# Patient Record
Sex: Female | Born: 1970 | Race: White | Hispanic: No | Marital: Married | Smoking: Current every day smoker
Health system: Southern US, Community
[De-identification: ages and names within clinical notes are randomized; demographics above are authoritative.]

## PROBLEM LIST (undated history)

## (undated) DIAGNOSIS — Z8669 Personal history of other diseases of the nervous system and sense organs: Secondary | ICD-10-CM

## (undated) DIAGNOSIS — F32A Depression, unspecified: Secondary | ICD-10-CM

## (undated) DIAGNOSIS — E785 Hyperlipidemia, unspecified: Secondary | ICD-10-CM

## (undated) DIAGNOSIS — T7840XA Allergy, unspecified, initial encounter: Secondary | ICD-10-CM

## (undated) DIAGNOSIS — D649 Anemia, unspecified: Secondary | ICD-10-CM

## (undated) DIAGNOSIS — Z9889 Other specified postprocedural states: Secondary | ICD-10-CM

## (undated) DIAGNOSIS — M1990 Unspecified osteoarthritis, unspecified site: Secondary | ICD-10-CM

## (undated) DIAGNOSIS — R112 Nausea with vomiting, unspecified: Secondary | ICD-10-CM

## (undated) DIAGNOSIS — H269 Unspecified cataract: Secondary | ICD-10-CM

## (undated) DIAGNOSIS — K219 Gastro-esophageal reflux disease without esophagitis: Secondary | ICD-10-CM

## (undated) DIAGNOSIS — F329 Major depressive disorder, single episode, unspecified: Secondary | ICD-10-CM

## (undated) DIAGNOSIS — F419 Anxiety disorder, unspecified: Secondary | ICD-10-CM

## (undated) DIAGNOSIS — R51 Headache: Secondary | ICD-10-CM

## (undated) HISTORY — DX: Unspecified osteoarthritis, unspecified site: M19.90

## (undated) HISTORY — DX: Personal history of other diseases of the nervous system and sense organs: Z86.69

## (undated) HISTORY — PX: FRACTURE SURGERY: SHX138

## (undated) HISTORY — DX: Allergy, unspecified, initial encounter: T78.40XA

## (undated) HISTORY — PX: MANDIBLE FRACTURE SURGERY: SHX706

## (undated) HISTORY — DX: Gastro-esophageal reflux disease without esophagitis: K21.9

## (undated) HISTORY — DX: Unspecified cataract: H26.9

## (undated) HISTORY — DX: Hyperlipidemia, unspecified: E78.5

---

## 1998-08-27 ENCOUNTER — Encounter: Payer: Self-pay | Admitting: Emergency Medicine

## 1998-08-27 ENCOUNTER — Emergency Department (HOSPITAL_COMMUNITY): Admission: EM | Admit: 1998-08-27 | Discharge: 1998-08-28 | Payer: Self-pay | Admitting: Emergency Medicine

## 1998-10-09 ENCOUNTER — Inpatient Hospital Stay (HOSPITAL_COMMUNITY): Admission: AD | Admit: 1998-10-09 | Discharge: 1998-10-09 | Payer: Self-pay | Admitting: *Deleted

## 1998-10-23 ENCOUNTER — Inpatient Hospital Stay (HOSPITAL_COMMUNITY): Admission: AD | Admit: 1998-10-23 | Discharge: 1998-10-23 | Payer: Self-pay | Admitting: *Deleted

## 1998-12-02 ENCOUNTER — Emergency Department (HOSPITAL_COMMUNITY): Admission: EM | Admit: 1998-12-02 | Discharge: 1998-12-03 | Payer: Self-pay | Admitting: Emergency Medicine

## 1998-12-05 ENCOUNTER — Encounter: Payer: Self-pay | Admitting: Emergency Medicine

## 1998-12-05 ENCOUNTER — Ambulatory Visit (HOSPITAL_COMMUNITY): Admission: RE | Admit: 1998-12-05 | Discharge: 1998-12-05 | Payer: Self-pay | Admitting: Emergency Medicine

## 2003-02-18 ENCOUNTER — Emergency Department (HOSPITAL_COMMUNITY): Admission: EM | Admit: 2003-02-18 | Discharge: 2003-02-18 | Payer: Self-pay | Admitting: Emergency Medicine

## 2004-03-27 ENCOUNTER — Inpatient Hospital Stay (HOSPITAL_COMMUNITY): Admission: AD | Admit: 2004-03-27 | Discharge: 2004-03-27 | Payer: Self-pay | Admitting: *Deleted

## 2005-05-28 ENCOUNTER — Observation Stay: Payer: Self-pay | Admitting: Obstetrics & Gynecology

## 2005-06-25 ENCOUNTER — Inpatient Hospital Stay: Payer: Self-pay | Admitting: Obstetrics & Gynecology

## 2005-12-06 ENCOUNTER — Emergency Department (HOSPITAL_COMMUNITY): Admission: EM | Admit: 2005-12-06 | Discharge: 2005-12-06 | Payer: Self-pay | Admitting: Emergency Medicine

## 2009-07-11 ENCOUNTER — Ambulatory Visit (HOSPITAL_COMMUNITY): Admission: RE | Admit: 2009-07-11 | Discharge: 2009-07-11 | Payer: Self-pay | Admitting: Obstetrics and Gynecology

## 2009-07-11 ENCOUNTER — Inpatient Hospital Stay (HOSPITAL_COMMUNITY): Admission: AD | Admit: 2009-07-11 | Discharge: 2009-07-12 | Payer: Self-pay | Admitting: Obstetrics & Gynecology

## 2009-08-19 ENCOUNTER — Inpatient Hospital Stay (HOSPITAL_COMMUNITY): Admission: AD | Admit: 2009-08-19 | Discharge: 2009-08-19 | Payer: Self-pay | Admitting: Obstetrics and Gynecology

## 2009-08-21 ENCOUNTER — Inpatient Hospital Stay (HOSPITAL_COMMUNITY): Admission: AD | Admit: 2009-08-21 | Discharge: 2009-08-22 | Payer: Self-pay | Admitting: Specialist

## 2010-09-08 LAB — FETAL FIBRONECTIN: Fetal Fibronectin: NEGATIVE

## 2010-09-11 LAB — GLUCOSE, CAPILLARY: Glucose-Capillary: 68 mg/dL — ABNORMAL LOW (ref 70–99)

## 2010-09-15 LAB — CBC
HCT: 29.6 % — ABNORMAL LOW (ref 36.0–46.0)
Hemoglobin: 10.1 g/dL — ABNORMAL LOW (ref 12.0–15.0)
MCHC: 34.2 g/dL (ref 30.0–36.0)
MCHC: 34.5 g/dL (ref 30.0–36.0)
MCV: 95.4 fL (ref 78.0–100.0)
RBC: 3.56 MIL/uL — ABNORMAL LOW (ref 3.87–5.11)
RDW: 15 % (ref 11.5–15.5)
WBC: 7.9 10*3/uL (ref 4.0–10.5)

## 2010-09-15 LAB — RPR: RPR Ser Ql: NONREACTIVE

## 2012-04-23 ENCOUNTER — Encounter (HOSPITAL_COMMUNITY): Payer: Self-pay | Admitting: *Deleted

## 2012-04-23 ENCOUNTER — Emergency Department (HOSPITAL_COMMUNITY)
Admission: EM | Admit: 2012-04-23 | Discharge: 2012-04-23 | Disposition: A | Discharge status: Home / Self Care | Payer: PRIVATE HEALTH INSURANCE | Attending: Emergency Medicine | Admitting: Emergency Medicine

## 2012-04-23 DIAGNOSIS — F17200 Nicotine dependence, unspecified, uncomplicated: Secondary | ICD-10-CM | POA: Insufficient documentation

## 2012-04-23 DIAGNOSIS — Y929 Unspecified place or not applicable: Secondary | ICD-10-CM | POA: Insufficient documentation

## 2012-04-23 DIAGNOSIS — R296 Repeated falls: Secondary | ICD-10-CM | POA: Insufficient documentation

## 2012-04-23 DIAGNOSIS — Z8781 Personal history of (healed) traumatic fracture: Secondary | ICD-10-CM | POA: Insufficient documentation

## 2012-04-23 DIAGNOSIS — M25579 Pain in unspecified ankle and joints of unspecified foot: Secondary | ICD-10-CM | POA: Insufficient documentation

## 2012-04-23 DIAGNOSIS — M25572 Pain in left ankle and joints of left foot: Secondary | ICD-10-CM

## 2012-04-23 DIAGNOSIS — Z8659 Personal history of other mental and behavioral disorders: Secondary | ICD-10-CM | POA: Insufficient documentation

## 2012-04-23 DIAGNOSIS — Y939 Activity, unspecified: Secondary | ICD-10-CM | POA: Insufficient documentation

## 2012-04-23 HISTORY — DX: Depression, unspecified: F32.A

## 2012-04-23 HISTORY — DX: Major depressive disorder, single episode, unspecified: F32.9

## 2012-04-23 MED ORDER — IBUPROFEN 800 MG PO TABS: 800.0000 mg | ORAL_TABLET | Freq: Three times a day (TID) | ORAL | Status: DC

## 2012-04-23 MED ORDER — HYDROMORPHONE HCL PF 2 MG/ML IJ SOLN
2.0000 mg | Freq: Once | INTRAMUSCULAR | Status: AC
  Administered 2012-04-23: 2 mg via INTRAMUSCULAR
  Filled 2012-04-23: qty 1

## 2012-04-23 MED ORDER — OXYCODONE-ACETAMINOPHEN 5-325 MG PO TABS: 1.0000 | ORAL_TABLET | ORAL | Status: DC | PRN

## 2012-04-23 NOTE — ED Provider Notes (Signed)
History     CSN: [id number]  Arrival date & time 04/23/12  1033   First MD Initiated Contact with Patient 04/23/12 1125      Chief Complaint  Patient presents with  . Pain    (Consider location/radiation/quality/duration/timing/severity/associated sxs/prior treatment) Patient is a 41 y.o. female presenting with ankle pain. The history is provided by the patient.  Ankle Pain  The incident occurred yesterday. Associated symptoms comments: She was seen after a fall yesterday at [facility] and diagnosed with an ankle fracture. She is here for evaluation of continued ankle pain without further injury that is unrelieved with Percocet. She has not been weight bearing. .    Past Medical History  Diagnosis Date  . Depression     History reviewed. No pertinent past surgical history.  No family history on file.  History  Substance Use Topics  . Smoking status: Current Every Day Smoker -- 0.5 packs/day  . Smokeless tobacco: Not on file  . Alcohol Use: Yes     rarely    OB History    Grav Para Term Preterm Abortions TAB SAB Ect Mult Living                  Review of Systems  Constitutional: Negative for fever and chills.  HENT: Negative.   Respiratory: Negative.   Cardiovascular: Negative.   Gastrointestinal: Negative.   Musculoskeletal:       See HPI.  Skin: Negative.   Neurological: Negative.     Allergies  Amoxicillin; Adhesive; and Aloe  Home Medications   Current Outpatient Rx  Name Route Sig Dispense Refill  . BC HEADACHE POWDER PO Oral Take by mouth.    . ADULT MULTIVITAMIN W/MINERALS CH Oral Take 1 tablet by mouth daily.    . OXYCODONE HCL 5 MG PO TABS Oral Take 5 mg by mouth every 6 (six) hours as needed.      BP 100/72  Pulse 75  Temp 98.1 F (36.7 C) (Oral)  Resp 20  Wt 115 lb (52.164 kg)  SpO2 100%  LMP 04/19/2012  Physical Exam  Constitutional: She is oriented to person, place, and time. She appears well-developed and well-nourished.   Neck: Normal range of motion.  Cardiovascular:       Pulses distally intact left LE.  Pulmonary/Chest: Effort normal.  Musculoskeletal:       Cast removed from left ankle. Moderate swelling with ecchymosis of ankle, predominantly lateral aspect. No bony deformities.   Neurological: She is alert and oriented to person, place, and time.  Skin: Skin is warm and dry.    ED Course  Procedures (including critical care time)  Labs Reviewed - No data to display No results found.   No diagnosis found.  1. Left ankle pain 2. History of fracture.  MDM  Pain is significantly relieved with change of splint. Original splint appeared to have been placed too tightly.  Records obtained from [facility] which gives report of minimally displaced fractures of lateral and medial malleoli, which is consistent with exam. No further x-rays warranted. Will refer to ortho.      Rodena Medin, PA-C 04/23/12 1241

## 2012-04-23 NOTE — ED Notes (Signed)
Pt states "broke my ankle yesterday, wasn't referred to an orthopedist, has roxicodone but it's not touching the pain"; pt presents with soft cast to LLE

## 2012-04-23 NOTE — ED Notes (Signed)
Ortho tech at bedside 

## 2012-04-23 NOTE — ED Notes (Signed)
Family at bedside. 

## 2012-04-23 NOTE — ED Notes (Signed)
pa at bedside. 

## 2012-04-24 NOTE — ED Provider Notes (Signed)
Medical screening examination/treatment/procedure(s) were performed by non-physician practitioner and as supervising physician I was immediately available for consultation/collaboration.   Antwan Bribiesca, MD 04/24/12 0704 

## 2012-04-27 ENCOUNTER — Other Ambulatory Visit: Payer: Self-pay | Admitting: Orthopaedic Surgery

## 2012-04-27 ENCOUNTER — Encounter (HOSPITAL_COMMUNITY): Payer: Self-pay | Admitting: *Deleted

## 2012-04-27 ENCOUNTER — Other Ambulatory Visit (HOSPITAL_COMMUNITY): Payer: Self-pay | Admitting: Orthopaedic Surgery

## 2012-04-27 NOTE — H&P (Signed)
Brianna Bowman is an 41 y.o. female.   Chief Complaint: left bimalleolar ankle fracture closed, displaced HPI: jumping a creek and had above injury. spint too tight, went to [facility] and new splint applied.   Past Medical History  Diagnosis Date  . Depression     No past surgical history on file.  No family history on file. Social History:  reports that she has been smoking.  She does not have any smokeless tobacco history on file. She reports that she drinks alcohol. She reports that she does not use illicit drugs.  Allergies:  Allergies  Allergen Reactions  . Amoxicillin Anaphylaxis  . Adhesive (Tape) Hives  . Aloe Hives    No prescriptions prior to admission    No results found for this or any previous visit (from the past 48 hour(s)). No results found.  Review of Systems  Constitutional: Negative.   HENT: Negative.   Eyes: Negative.   Respiratory:       Smokes  Gastrointestinal: Negative.   Genitourinary: Negative.   Musculoskeletal:       Left SLS  Neurological: Negative.   Psychiatric/Behavioral: Positive for depression.    Last menstrual period 04/19/2012. Physical Exam  Constitutional: She is oriented to person, place, and time. She appears well-developed and well-nourished.  HENT:  Head: Normocephalic.  Eyes: Pupils are equal, round, and reactive to light.  Neck: Normal range of motion.  Cardiovascular: Normal rate.   Respiratory: Effort normal and breath sounds normal. No respiratory distress. She has no wheezes.  GI: Soft. Bowel sounds are normal.  Musculoskeletal:       Sensation intact   to   toes, good cap refill  Neurological: She is alert and oriented to person, place, and time.  Skin: Skin is warm and dry.  Psychiatric: She has a normal mood and affect. Her behavior is normal.     Assessment/Plan Left closed bimalleolar ankle fracture, plan ORIF. Risks discussed, all ?'s answered and discussed. She requests we proceed.   Hadlee Burback  C 04/27/2012, 1:08 PM

## 2012-04-27 NOTE — Progress Notes (Signed)
Need surgery orders, office aware and Dr. Ophelia Charter will put orders in [facility].

## 2012-04-28 ENCOUNTER — Observation Stay (HOSPITAL_COMMUNITY)
Admission: RE | Admit: 2012-04-28 | Discharge: 2012-04-29 | Disposition: A | Discharge status: Home / Self Care | Payer: PRIVATE HEALTH INSURANCE | Source: Ambulatory Visit | Attending: Orthopaedic Surgery | Admitting: Orthopaedic Surgery

## 2012-04-28 ENCOUNTER — Ambulatory Visit (HOSPITAL_COMMUNITY): Payer: PRIVATE HEALTH INSURANCE | Admitting: Anesthesiology

## 2012-04-28 ENCOUNTER — Encounter (HOSPITAL_COMMUNITY): Payer: Self-pay | Admitting: Anesthesiology

## 2012-04-28 ENCOUNTER — Encounter (HOSPITAL_COMMUNITY)
Admission: RE | Disposition: A | Discharge status: Home / Self Care | Payer: Self-pay | Source: Ambulatory Visit | Attending: Orthopaedic Surgery

## 2012-04-28 ENCOUNTER — Encounter (HOSPITAL_COMMUNITY): Payer: Self-pay

## 2012-04-28 ENCOUNTER — Encounter (HOSPITAL_COMMUNITY): Payer: Self-pay | Admitting: Certified Registered Nurse Anesthetist

## 2012-04-28 ENCOUNTER — Ambulatory Visit (HOSPITAL_COMMUNITY): Payer: PRIVATE HEALTH INSURANCE

## 2012-04-28 DIAGNOSIS — S82843A Displaced bimalleolar fracture of unspecified lower leg, initial encounter for closed fracture: Principal | ICD-10-CM | POA: Insufficient documentation

## 2012-04-28 DIAGNOSIS — S82892A Other fracture of left lower leg, initial encounter for closed fracture: Secondary | ICD-10-CM | POA: Diagnosis present

## 2012-04-28 DIAGNOSIS — X500XXA Overexertion from strenuous movement or load, initial encounter: Secondary | ICD-10-CM | POA: Insufficient documentation

## 2012-04-28 HISTORY — DX: Other specified postprocedural states: R11.2

## 2012-04-28 HISTORY — PX: ORIF ANKLE FRACTURE: SHX5408

## 2012-04-28 HISTORY — DX: Other specified postprocedural states: Z98.890

## 2012-04-28 HISTORY — DX: Anemia, unspecified: D64.9

## 2012-04-28 LAB — CBC
HCT: 36.4 % (ref 36.0–46.0)
MCHC: 33.5 g/dL (ref 30.0–36.0)
MCV: 94.3 fL (ref 78.0–100.0)
Platelets: 274 10*3/uL (ref 150–400)
RBC: 3.86 MIL/uL — ABNORMAL LOW (ref 3.87–5.11)
RDW: 13.1 % (ref 11.5–15.5)

## 2012-04-28 LAB — BASIC METABOLIC PANEL
BUN: 8 mg/dL (ref 6–23)
CO2: 24 mEq/L (ref 19–32)
Chloride: 106 mEq/L (ref 96–112)
Creatinine, Ser: 0.61 mg/dL (ref 0.50–1.10)
GFR calc Af Amer: >90 mL/min (ref >90)
GFR calc non Af Amer: >90 mL/min (ref >90)

## 2012-04-28 SURGERY — OPEN REDUCTION INTERNAL FIXATION (ORIF) ANKLE FRACTURE
Anesthesia: General | Site: Ankle | Laterality: Left | Wound class: Clean

## 2012-04-28 MED ORDER — ZOLPIDEM TARTRATE 5 MG PO TABS: 5.0000 mg | ORAL_TABLET | Freq: Every evening | ORAL | Status: DC | PRN

## 2012-04-28 MED ORDER — HYDROMORPHONE HCL PF 1 MG/ML IJ SOLN
INTRAMUSCULAR | Status: DC | PRN
  Administered 2012-04-28 (×2): 0.5 mg via INTRAVENOUS

## 2012-04-28 MED ORDER — KCL IN DEXTROSE-NACL 20-5-0.45 MEQ/L-%-% IV SOLN
INTRAVENOUS | Status: DC
  Administered 2012-04-28: 22:00:00 via INTRAVENOUS
  Filled 2012-04-28 (×2): qty 1000

## 2012-04-28 MED ORDER — 0.9 % SODIUM CHLORIDE (POUR BTL) OPTIME
TOPICAL | Status: DC | PRN
  Administered 2012-04-28: 1000 mL

## 2012-04-28 MED ORDER — FLEET ENEMA 7-19 GM/118ML RE ENEM: 1.0000 | ENEMA | Freq: Once | RECTAL | Status: AC | PRN

## 2012-04-28 MED ORDER — METHOCARBAMOL 500 MG PO TABS: 500.0000 mg | ORAL_TABLET | Freq: Four times a day (QID) | ORAL | Status: DC

## 2012-04-28 MED ORDER — ASPIRIN EC 325 MG PO TBEC: 325.0000 mg | DELAYED_RELEASE_TABLET | Freq: Every day | ORAL | Status: DC

## 2012-04-28 MED ORDER — HYDROCODONE-ACETAMINOPHEN 5-325 MG PO TABS: 1.0000 | ORAL_TABLET | ORAL | Status: DC | PRN

## 2012-04-28 MED ORDER — MIDAZOLAM HCL 5 MG/5ML IJ SOLN
INTRAMUSCULAR | Status: DC | PRN
  Administered 2012-04-28: 2 mg via INTRAVENOUS

## 2012-04-28 MED ORDER — FENTANYL CITRATE 0.05 MG/ML IJ SOLN
INTRAMUSCULAR | Status: DC | PRN
  Administered 2012-04-28: 150 ug via INTRAVENOUS
  Administered 2012-04-28 (×2): 100 ug via INTRAVENOUS
  Administered 2012-04-28: 50 ug via INTRAVENOUS

## 2012-04-28 MED ORDER — KCL IN DEXTROSE-NACL 20-5-0.45 MEQ/L-%-% IV SOLN
INTRAVENOUS | Status: AC
  Filled 2012-04-28: qty 1000

## 2012-04-28 MED ORDER — ONDANSETRON HCL 4 MG/2ML IJ SOLN: 4.0000 mg | Freq: Once | INTRAMUSCULAR | Status: DC | PRN

## 2012-04-28 MED ORDER — BUPIVACAINE-EPINEPHRINE PF 0.5-1:200000 % IJ SOLN
INTRAMUSCULAR | Status: DC | PRN
  Administered 2012-04-28: 17 mL

## 2012-04-28 MED ORDER — ONDANSETRON HCL 4 MG/2ML IJ SOLN
4.0000 mg | Freq: Four times a day (QID) | INTRAMUSCULAR | Status: DC | PRN
  Administered 2012-04-28 – 2012-04-29 (×2): 4 mg via INTRAVENOUS
  Filled 2012-04-28 (×2): qty 2

## 2012-04-28 MED ORDER — EPHEDRINE SULFATE 50 MG/ML IJ SOLN
INTRAMUSCULAR | Status: DC | PRN
  Administered 2012-04-28: 10 mg via INTRAVENOUS

## 2012-04-28 MED ORDER — ONDANSETRON HCL 4 MG PO TABS: 4.0000 mg | ORAL_TABLET | Freq: Four times a day (QID) | ORAL | Status: DC | PRN

## 2012-04-28 MED ORDER — BUPIVACAINE-EPINEPHRINE (PF) 0.5% -1:200000 IJ SOLN
INTRAMUSCULAR | Status: AC
  Filled 2012-04-28: qty 10

## 2012-04-28 MED ORDER — SCOPOLAMINE 1 MG/3DAYS TD PT72
1.0000 | MEDICATED_PATCH | TRANSDERMAL | Status: DC
  Administered 2012-04-28: 1.5 mg via TRANSDERMAL

## 2012-04-28 MED ORDER — MUPIROCIN 2 % EX OINT
TOPICAL_OINTMENT | Freq: Two times a day (BID) | CUTANEOUS | Status: DC
  Administered 2012-04-28: 1 via NASAL
  Filled 2012-04-28: qty 22

## 2012-04-28 MED ORDER — BISACODYL 10 MG RE SUPP: 10.0000 mg | Freq: Every day | RECTAL | Status: DC | PRN

## 2012-04-28 MED ORDER — DIPHENHYDRAMINE HCL 12.5 MG/5ML PO ELIX: 12.5000 mg | ORAL_SOLUTION | ORAL | Status: DC | PRN

## 2012-04-28 MED ORDER — LIDOCAINE HCL (CARDIAC) 20 MG/ML IV SOLN
INTRAVENOUS | Status: DC | PRN
  Administered 2012-04-28: 50 mg via INTRAVENOUS

## 2012-04-28 MED ORDER — ONDANSETRON HCL 4 MG/2ML IJ SOLN
INTRAMUSCULAR | Status: DC | PRN
  Administered 2012-04-28: 4 mg via INTRAVENOUS

## 2012-04-28 MED ORDER — LACTATED RINGERS IV SOLN
INTRAVENOUS | Status: DC
Start: 2012-04-28 — End: 2012-04-28
  Administered 2012-04-28: 16:00:00 via INTRAVENOUS

## 2012-04-28 MED ORDER — DOCUSATE SODIUM 100 MG PO CAPS
100.0000 mg | ORAL_CAPSULE | Freq: Two times a day (BID) | ORAL | Status: DC
  Administered 2012-04-28 – 2012-04-29 (×2): 100 mg via ORAL
  Filled 2012-04-28 (×2): qty 1

## 2012-04-28 MED ORDER — HYDROMORPHONE HCL PF 1 MG/ML IJ SOLN
0.5000 mg | INTRAMUSCULAR | Status: DC | PRN
  Administered 2012-04-28 – 2012-04-29 (×4): 1 mg via INTRAVENOUS
  Filled 2012-04-28 (×4): qty 1

## 2012-04-28 MED ORDER — PROPOFOL 10 MG/ML IV BOLUS
INTRAVENOUS | Status: DC | PRN
  Administered 2012-04-28: 200 mg via INTRAVENOUS

## 2012-04-28 MED ORDER — METHOCARBAMOL 500 MG PO TABS: 500.0000 mg | ORAL_TABLET | Freq: Four times a day (QID) | ORAL | Status: DC | PRN

## 2012-04-28 MED ORDER — METOCLOPRAMIDE HCL 5 MG/ML IJ SOLN
5.0000 mg | Freq: Three times a day (TID) | INTRAMUSCULAR | Status: DC | PRN
  Administered 2012-04-29: 10 mg via INTRAVENOUS
  Filled 2012-04-28: qty 2

## 2012-04-28 MED ORDER — LACTATED RINGERS IV SOLN
INTRAVENOUS | Status: DC | PRN
  Administered 2012-04-28 (×2): via INTRAVENOUS

## 2012-04-28 MED ORDER — CLINDAMYCIN PHOSPHATE 600 MG/50ML IV SOLN
600.0000 mg | Freq: Four times a day (QID) | INTRAVENOUS | Status: AC
  Administered 2012-04-28 – 2012-04-29 (×3): 600 mg via INTRAVENOUS
  Filled 2012-04-28 (×3): qty 50

## 2012-04-28 MED ORDER — HYDROMORPHONE HCL PF 1 MG/ML IJ SOLN
INTRAMUSCULAR | Status: AC
  Filled 2012-04-28: qty 1

## 2012-04-28 MED ORDER — METOCLOPRAMIDE HCL 10 MG PO TABS: 5.0000 mg | ORAL_TABLET | Freq: Three times a day (TID) | ORAL | Status: DC | PRN

## 2012-04-28 MED ORDER — FENTANYL CITRATE 0.05 MG/ML IJ SOLN
50.0000 ug | INTRAMUSCULAR | Status: DC | PRN
  Administered 2012-04-28: 100 ug via INTRAVENOUS

## 2012-04-28 MED ORDER — OXYCODONE HCL 5 MG PO TABS
5.0000 mg | ORAL_TABLET | ORAL | Status: DC | PRN
  Administered 2012-04-29 (×3): 10 mg via ORAL
  Filled 2012-04-28 (×3): qty 2

## 2012-04-28 MED ORDER — HYDROMORPHONE HCL PF 1 MG/ML IJ SOLN
0.2500 mg | INTRAMUSCULAR | Status: DC | PRN
  Administered 2012-04-28 (×4): 0.5 mg via INTRAVENOUS

## 2012-04-28 MED ORDER — METHOCARBAMOL 100 MG/ML IJ SOLN
500.0000 mg | Freq: Four times a day (QID) | INTRAVENOUS | Status: DC | PRN
  Filled 2012-04-28: qty 5

## 2012-04-28 MED ORDER — DEXAMETHASONE SODIUM PHOSPHATE 4 MG/ML IJ SOLN
INTRAMUSCULAR | Status: DC | PRN
  Administered 2012-04-28: 4 mg via INTRAVENOUS

## 2012-04-28 MED ORDER — CLINDAMYCIN PHOSPHATE 600 MG/50ML IV SOLN
600.0000 mg | INTRAVENOUS | Status: AC
  Administered 2012-04-28: 600 mg via INTRAVENOUS
  Filled 2012-04-28: qty 50

## 2012-04-28 MED ORDER — FENTANYL CITRATE 0.05 MG/ML IJ SOLN
INTRAMUSCULAR | Status: AC
  Filled 2012-04-28: qty 2

## 2012-04-28 MED ORDER — ASPIRIN 325 MG PO TABS
325.0000 mg | ORAL_TABLET | Freq: Every day | ORAL | Status: DC
  Administered 2012-04-28 – 2012-04-29 (×2): 325 mg via ORAL
  Filled 2012-04-28 (×2): qty 1

## 2012-04-28 MED ORDER — MUPIROCIN 2 % EX OINT
TOPICAL_OINTMENT | CUTANEOUS | Status: AC
  Administered 2012-04-28: 1 via NASAL
  Filled 2012-04-28: qty 22

## 2012-04-28 MED ORDER — METHOCARBAMOL 100 MG/ML IJ SOLN
500.0000 mg | INTRAVENOUS | Status: AC
  Administered 2012-04-28: 500 mg via INTRAVENOUS
  Filled 2012-04-28: qty 5

## 2012-04-28 MED ORDER — SENNOSIDES-DOCUSATE SODIUM 8.6-50 MG PO TABS: 1.0000 | ORAL_TABLET | Freq: Every evening | ORAL | Status: DC | PRN

## 2012-04-28 MED ORDER — SCOPOLAMINE 1 MG/3DAYS TD PT72
MEDICATED_PATCH | TRANSDERMAL | Status: AC
  Filled 2012-04-28: qty 1

## 2012-04-28 MED ORDER — OXYCODONE-ACETAMINOPHEN 5-325 MG PO TABS: 2.0000 | ORAL_TABLET | ORAL | Status: DC | PRN

## 2012-04-28 SURGICAL SUPPLY — 67 items
BANDAGE ELASTIC 4 VELCRO ST LF (GAUZE/BANDAGES/DRESSINGS) IMPLANT
BANDAGE ELASTIC 6 VELCRO ST LF (GAUZE/BANDAGES/DRESSINGS) IMPLANT
BANDAGE ESMARK 6X9 LF (GAUZE/BANDAGES/DRESSINGS) IMPLANT
BIT DRILL 2.5X2.75 QC CALB (BIT) ×2 IMPLANT
BIT DRILL 2.9X70 QC CALB (BIT) ×2 IMPLANT
BNDG ESMARK 6X9 LF (GAUZE/BANDAGES/DRESSINGS)
CLOTH BEACON ORANGE TIMEOUT ST (SAFETY) ×2 IMPLANT
COVER MAYO STAND STRL (DRAPES) ×2 IMPLANT
COVER SURGICAL LIGHT HANDLE (MISCELLANEOUS) ×2 IMPLANT
CUFF TOURNIQUET SINGLE 34IN LL (TOURNIQUET CUFF) IMPLANT
CUFF TOURNIQUET SINGLE 44IN (TOURNIQUET CUFF) IMPLANT
DRAPE C-ARM 42X72 X-RAY (DRAPES) IMPLANT
DRAPE C-ARM MINI 42X72 WSTRAPS (DRAPES) ×2 IMPLANT
DRAPE INCISE IOBAN 66X45 STRL (DRAPES) ×2 IMPLANT
DRAPE PROXIMA HALF (DRAPES) ×2 IMPLANT
DRAPE U-SHAPE 47X51 STRL (DRAPES) ×2 IMPLANT
DRSG PAD ABDOMINAL 8X10 ST (GAUZE/BANDAGES/DRESSINGS) ×2 IMPLANT
DURAPREP 26ML APPLICATOR (WOUND CARE) ×2 IMPLANT
ELECT REM PT RETURN 9FT ADLT (ELECTROSURGICAL) ×2
ELECTRODE REM PT RTRN 9FT ADLT (ELECTROSURGICAL) ×1 IMPLANT
GAUZE XEROFORM 5X9 LF (GAUZE/BANDAGES/DRESSINGS) ×2 IMPLANT
GLOVE BIOGEL PI IND STRL 7.5 (GLOVE) ×1 IMPLANT
GLOVE BIOGEL PI IND STRL 8 (GLOVE) ×1 IMPLANT
GLOVE BIOGEL PI INDICATOR 7.5 (GLOVE) ×1
GLOVE BIOGEL PI INDICATOR 8 (GLOVE) ×1
GLOVE ECLIPSE 7.0 STRL STRAW (GLOVE) ×2 IMPLANT
GLOVE ORTHO TXT STRL SZ7.5 (GLOVE) ×2 IMPLANT
GOWN PREVENTION PLUS LG XLONG (DISPOSABLE) ×2 IMPLANT
GOWN PREVENTION PLUS XLARGE (GOWN DISPOSABLE) ×2 IMPLANT
GOWN STRL NON-REIN LRG LVL3 (GOWN DISPOSABLE) ×2 IMPLANT
K-WIRE ACE 1.6X6 (WIRE) ×4
KIT BASIN OR (CUSTOM PROCEDURE TRAY) ×2 IMPLANT
KIT ROOM TURNOVER OR (KITS) ×2 IMPLANT
KWIRE ACE 1.6X6 (WIRE) ×2 IMPLANT
MANIFOLD NEPTUNE II (INSTRUMENTS) ×2 IMPLANT
NEEDLE HYPO 25GX1X1/2 BEV (NEEDLE) ×2 IMPLANT
NS IRRIG 1000ML POUR BTL (IV SOLUTION) ×2 IMPLANT
PACK ORTHO EXTREMITY (CUSTOM PROCEDURE TRAY) ×2 IMPLANT
PAD ARMBOARD 7.5X6 YLW CONV (MISCELLANEOUS) ×4 IMPLANT
PAD CAST 4YDX4 CTTN HI CHSV (CAST SUPPLIES) ×1 IMPLANT
PADDING CAST COTTON 4X4 STRL (CAST SUPPLIES) ×1
PADDING CAST COTTON 6X4 STRL (CAST SUPPLIES) ×2 IMPLANT
PLATE LOCK 7H 92 BILAT FIB (Plate) ×2 IMPLANT
SCREW CORTICAL 3.5MM  44MM (Screw) ×1 IMPLANT
SCREW CORTICAL 3.5MM 40MM (Screw) ×2 IMPLANT
SCREW CORTICAL 3.5MM 44MM (Screw) ×1 IMPLANT
SCREW LOCK CORT STAR 3.5X10 (Screw) ×4 IMPLANT
SCREW LOCK CORT STAR 3.5X12 (Screw) ×4 IMPLANT
SCREW LOW PROFILE 12MMX3.5MM (Screw) ×4 IMPLANT
SCREW NLOCK CANC 4X60 (Screw) ×4 IMPLANT
SCREW NLOCK CANC HEX 4X26 (Screw) ×2 IMPLANT
SCREW NON LOCKING LP 3.5 14MM (Screw) ×2 IMPLANT
SPLINT PLASTER CAST XFAST 5X30 (CAST SUPPLIES) ×1 IMPLANT
SPLINT PLASTER XFAST SET 5X30 (CAST SUPPLIES) ×1
SPONGE GAUZE 4X4 12PLY (GAUZE/BANDAGES/DRESSINGS) ×2 IMPLANT
SPONGE LAP 18X18 X RAY DECT (DISPOSABLE) ×2 IMPLANT
STAPLER VISISTAT 35W (STAPLE) IMPLANT
SUCTION FRAZIER TIP 10 FR DISP (SUCTIONS) ×2 IMPLANT
SUT ETHILON 3 0 PS 1 (SUTURE) IMPLANT
SUT VIC AB 2-0 CT1 27 (SUTURE) ×2
SUT VIC AB 2-0 CT1 TAPERPNT 27 (SUTURE) ×2 IMPLANT
SYR CONTROL 10ML LL (SYRINGE) ×2 IMPLANT
TOWEL OR 17X24 6PK STRL BLUE (TOWEL DISPOSABLE) ×2 IMPLANT
TOWEL OR 17X26 10 PK STRL BLUE (TOWEL DISPOSABLE) ×2 IMPLANT
TUBE CONNECTING 12X1/4 (SUCTIONS) ×2 IMPLANT
WATER STERILE IRR 1000ML POUR (IV SOLUTION) ×2 IMPLANT
YANKAUER SUCT BULB TIP NO VENT (SUCTIONS) ×2 IMPLANT

## 2012-04-28 NOTE — Interval H&P Note (Signed)
History and Physical Interval Note:  04/28/2012 4:21 PM  Brianna Bowman  has presented today for surgery, with the diagnosis of Left bimalleolar ankle fracture  The various methods of treatment have been discussed with the patient and family. After consideration of risks, benefits and other options for treatment, the patient has consented to  Procedure(s) (LRB) with comments: OPEN REDUCTION INTERNAL FIXATION (ORIF) ANKLE FRACTURE (Left) - Open reduction internal fixation left ankle bimalleolar fracture as a surgical intervention .  The patient's history has been reviewed, patient examined, no change in status, stable for surgery.  I have reviewed the patient's chart and labs.  Questions were answered to the patient's satisfaction.     Laurieanne Galloway C

## 2012-04-28 NOTE — Anesthesia Preprocedure Evaluation (Signed)
Anesthesia Evaluation  Patient identified by MRN, date of birth, ID band Patient awake    Reviewed: Allergy & Precautions, H&P , NPO status , Patient's Chart, lab work & pertinent test results  History of Anesthesia Complications (+) PONV  Airway Mallampati: I TM Distance: >3 FB Neck ROM: full    Dental   Pulmonary          Cardiovascular Exercise Tolerance: Good Rhythm:regular Rate:Normal     Neuro/Psych PSYCHIATRIC DISORDERS Depression    GI/Hepatic   Endo/Other    Renal/GU      Musculoskeletal   Abdominal   Peds  Hematology   Anesthesia Other Findings   Reproductive/Obstetrics                           Anesthesia Physical Anesthesia Plan  ASA: I  Anesthesia Plan: General   Post-op Pain Management:    Induction: Intravenous  Airway Management Planned: LMA and Oral ETT  Additional Equipment:   Intra-op Plan:   Post-operative Plan: Extubation in OR  Informed Consent: I have reviewed the patients History and Physical, chart, labs and discussed the procedure including the risks, benefits and alternatives for the proposed anesthesia with the patient or authorized representative who has indicated his/her understanding and acceptance.     Plan Discussed with: CRNA, Anesthesiologist and Surgeon  Anesthesia Plan Comments:         Anesthesia Quick Evaluation

## 2012-04-28 NOTE — Brief Op Note (Signed)
04/28/2012  6:51 PM  PATIENT:  Brianna Bowman  41 y.o. female  PRE-OPERATIVE DIAGNOSIS:  Left bimalleolar ankle fracture  POST-OPERATIVE DIAGNOSIS:  Left bimalleolar ankle fracture  PROCEDURE:  Procedure(s) (LRB) with comments: OPEN REDUCTION INTERNAL FIXATION (ORIF) ANKLE FRACTURE (Left) - Open reduction internal fixation left ankle bimalleolar fracture  SURGEON:  Surgeon(s) and Role:    * Eldred Manges, MD - Primary  PHYSICIAN ASSISTANT:   ASSISTANTS: none   ANESTHESIA:   local and general  EBL:  Total I/O In: 1200 [I.V.:1200] Out: -   BLOOD ADMINISTERED:none  DRAINS: none   LOCAL MEDICATIONS USED:  MARCAINE     SPECIMEN:  No Specimen  DISPOSITION OF SPECIMEN:  N/A  COUNTS:  YES  TOURNIQUET:   Total Tourniquet Time Documented: Thigh (Left) - 57 minutes  DICTATION: .Other Dictation: Dictation Number [id number]  PLAN OF CARE: Admit for overnight observation  PATIENT DISPOSITION:  PACU - hemodynamically stable.   Delay start of Pharmacological VTE agent (>24hrs) due to surgical blood loss or risk of bleeding: yes

## 2012-04-28 NOTE — Op Note (Signed)
Preop diagnosis: Left bimalleolar ankle fracture   Postop: diagnosis left bimalleolar ankle fracture  Procedure ORIF medial and lateral malleolus left ankle  Surgeon Annell Greening M.D.  Anesthesia :GOT  Local anesthesia added  Operative procedure:  After induction of general anesthesia oral tracheal intubation quit clindamycin antibiotics standard prepping and DuraPrep proximal plantar was applied extremity sheets. Stockinette was applied in timeout procedure was performed.  Leg was wrapped an Esmarch tourniquet inflated to 350 per less than one hour. Lateral incision was made subperiosteal dissection of the fibula was performed. This was a supination abduction injury with a vertical medial malleolar fracture and a transverse fibular fracture just slightly above the level of the mortise. Fracture was reduced locking plate was selected to blocking plate was bent in the first the screw was angled at 45. Comment locking plate was used additional unicortical locking screws are placed distally and proximally compression screws were used nonlocking bicortical 10 and 12 mm.  It was anatomic checked under fluoroscopy medial side was open reduced K wire is used to secure. There were some anterior comminution that extended into the plafond region. Fracture fragments were reduced and once the a medial malleolar fracture fragment was reduced and now with a wire there was good position. A 60 mm screw was placed up the medial malleolus partially threaded cancellus to lag the fracture and then a transverse screw was placed slightly oblique more proximal posterior and then 1. A second screw was placed up the minimal illness. Escape the 2 standard screws of the malleolus which were 9 cannulated and then 2 additional screws one anterior and posterior. Anterior of the screws with bicortical and caught the lateral cortex of the tibia but not did not tenderness anastomosis. Final spot pictures were taken irrigation  standard closure 2-0 Vicryl tourniquet deflation hemostasis skin staple closure Maecie Sevcik infiltrates and 17 cc into the scan and then Xeroform 4 x 4's and short-leg splint.  Signed Annell Greening M.D.

## 2012-04-28 NOTE — Progress Notes (Signed)
Patient states she had a debit card in purse and I encouraged her to let me lock it in security but she states" I don't want to"." There are things in there I will need if I spend the night."  Informed her that the hospital won't be responsible for any valuables and she still declines locking up valuables.

## 2012-04-28 NOTE — Progress Notes (Signed)
Reported to pharmacy tech that med rec needed to be completed. Stated "okay", After patient's information given to her.  Paged pharmacy tech again at 1530 due to med rec not completed.  No response. Call to 2nd floor satellite pharmacy to report med rec history needs to be marked complete.  He states he will let them know.

## 2012-04-28 NOTE — Anesthesia Postprocedure Evaluation (Signed)
  Anesthesia Post-op Note  Patient: Brianna Bowman  Procedure(s) Performed: Procedure(s) (LRB) with comments: OPEN REDUCTION INTERNAL FIXATION (ORIF) ANKLE FRACTURE (Left) - Open reduction internal fixation left ankle bimalleolar fracture  Patient Location: PACU  Anesthesia Type:General  Level of Consciousness: awake  Airway and Oxygen Therapy: Patient Spontanous Breathing  Post-op Pain: mild  Post-op Assessment: Post-op Vital signs reviewed, Patient's Cardiovascular Status Stable, Respiratory Function Stable, Patent Airway, No signs of Nausea or vomiting and Pain level controlled  Post-op Vital Signs: stable  Complications: No apparent anesthesia complications

## 2012-04-28 NOTE — Transfer of Care (Signed)
Immediate Anesthesia Transfer of Care Note  Patient: Brianna Bowman  Procedure(s) Performed: Procedure(s) (LRB) with comments: OPEN REDUCTION INTERNAL FIXATION (ORIF) ANKLE FRACTURE (Left) - Open reduction internal fixation left ankle bimalleolar fracture  Patient Location: PACU  Anesthesia Type:General  Level of Consciousness: awake, alert  and oriented  Airway & Oxygen Therapy: Patient Spontanous Breathing and Patient connected to nasal cannula oxygen  Post-op Assessment: Report given to PACU RN, Post -op Vital signs reviewed and stable and Patient moving all extremities X 4  Post vital signs: Reviewed and stable  Complications: No apparent anesthesia complications

## 2012-04-28 NOTE — Progress Notes (Signed)
Pt took one of her oxycodone 5/ 325mg  tablets at 1520

## 2012-04-28 NOTE — H&P (Signed)
  [facility]   A Division of [employer]   [address], [place], [place] [address] Telephone: [phone]  Fax: [phone]     PATIENT: Brianna, Bowman   MR#: [id number]  DOB: 10/25/1970       HISTORY OF PRESENT ILLNESS:  A 41 year old female was trying to jump a creek, injured her left ankle with bimalleolar ankle fracture.  Was seen at [facility], had a splint applied, which was too tight.  Had problems with that and then was seen at [facility], where the splint was removed.  Another splint was applied with padding.  She had a prescription for some Percocet, had to take some Benadryl with it as it caused some itching.      ALLERGIES:  She is allergic to amoxicillin.  She had some problems with itching but this resolved with Benadryl.   PAST MEDICAL/SURGICAL HISTORY:  She has had previous femur fracture when she was age 5 on the left, broken jaw age 68.     SOCIAL HISTORY:  The patient is separated.  She is currently unemployed.  Smokes 1/2 pack per day, does not drink.    FAMILY HISTORY:  Positive for heart disease, also positive for prostate cancer, hypertension.     REVIEW OF SYSTEMS:  Fourteen-point review of systems positive for depression, otherwise negative.     PHYSICAL EXAMINATION:  The patient is 5 feet, 5 inches, 115 pounds.  Alert and oriented.  WDWN.  NAD.  Extraocular movements intact.  Lungs are clear to auscultation.  Heart regular rate and rhythm. No rales or rhonchi on auscultation.  Abdomen soft, nontender.  Short leg splint present.  No pain with hip range of motion.  Mild edema.  Sensation of the fingertip is intact.    RADIOGRAPHS/TEST:  X-rays reviewed on the [facility], which shows a vertical fracture of the medial malleolus.  Transverse fracture of the fibula just above the level of the mortise.     ASSESSMENT/DIAGNOSIS:  Supination, abduction type 3 injury with intact posterior malleolus.       PLAN:  ORIF, bimalleolar ankle fracture.  Overnight stay in the hospital.  Risks of surgery, bleeding, infection discussed.  We discussed Ancef 2 grams.  All questions answered.  She requests we proceed.  Prescription for Percocet given, #50 tablets 1-2 p.o. every 6 hours p.r.n. pain.        Mark C. Ophelia Charter, M.D.

## 2012-04-29 ENCOUNTER — Encounter (HOSPITAL_COMMUNITY): Payer: Self-pay | Admitting: Orthopaedic Surgery

## 2012-04-29 LAB — SURGICAL PCR SCREEN
MRSA, PCR: NEGATIVE
Staphylococcus aureus: NEGATIVE

## 2012-04-29 NOTE — Progress Notes (Signed)
Subjective: 1 Day Post-Op Procedure(s) (LRB): OPEN REDUCTION INTERNAL FIXATION (ORIF) ANKLE FRACTURE (Left) Patient reports pain as moderate.   Not out of bed yet.  Will have PT today before discharge Objective: Vital signs in last 24 hours: Temp:  [97.2 F (36.2 C)-98.5 F (36.9 C)] 98.3 F (36.8 C) (11/07 0600) Pulse Rate:  [68-93] 73  (11/07 0600) Resp:  [10-29] 18  (11/07 0600) BP: (91-126)/(54-70) 91/56 mmHg (11/07 0600) SpO2:  [94 %-100 %] 98 % (11/07 0600)  Intake/Output from previous day: 11/06 0701 - 11/07 0700 In: 2014.5 [I.V.:1962.5; IV Piggyback:52] Out: -  Intake/Output this shift:     Basename 04/28/12 1428  HGB 12.2    Basename 04/28/12 1428  WBC 8.0  RBC 3.86*  HCT 36.4  PLT 274    Basename 04/28/12 1428  NA 138  K 3.8  CL 106  CO2 24  BUN 8  CREATININE 0.61  GLUCOSE 109*  CALCIUM 9.2   No results found for this basename: LABPT:2,INR:2 in the last 72 hours  Neurovascular intact splint intact  Assessment/Plan: 1 Day Post-Op Procedure(s) (LRB): OPEN REDUCTION INTERNAL FIXATION (ORIF) ANKLE FRACTURE (Left) Up with therapy Discharge home with home health rx and AVS on chart   Brianna Bowman M 04/29/2012, 9:17 AM

## 2012-04-29 NOTE — Progress Notes (Signed)
Utilization review completed. Durward Matranga, RN, BSN. 

## 2012-04-29 NOTE — Evaluation (Signed)
Physical Therapy Evaluation Patient Details Name: Brianna Bowman MRN: [id number] DOB: 1971/01/21 Today's Date: 04/29/2012 Time: 6578-4696 PT Time Calculation (min): 26 min  PT Assessment / Plan / Recommendation Clinical Impression  pt presents with L ankle fx s/p ORIF.  pt very painful, but moving well.  pt notes her only concern is being able to get in/out of bathtub to shower.  Reviewed different types of shower seats and had pt trial shower seat in tub.      PT Assessment  Patent does not need any further PT services    Follow Up Recommendations  No PT follow up    Does the patient have the potential to tolerate intense rehabilitation      Barriers to Discharge        Equipment Recommendations  Tub/shower seat    Recommendations for Other Services     Frequency      Precautions / Restrictions Precautions Precautions: Fall Restrictions Weight Bearing Restrictions: Yes LLE Weight Bearing: Non weight bearing   Pertinent Vitals/Pain L ankle 9/10 during mobility.        Mobility  Bed Mobility Bed Mobility: Supine to Sit;Sitting - Scoot to Edge of Bed Supine to Sit: 6: Modified independent (Device/Increase time) Sitting - Scoot to Edge of Bed: 6: Modified independent (Device/Increase time) Details for Bed Mobility Assistance: moves slowly, but able to complete without A.   Transfers Transfers: Sit to Stand;Stand to Sit Sit to Stand: 6: Modified independent (Device/Increase time);With upper extremity assist;From bed;From chair/3-in-1 Stand to Sit: 6: Modified independent (Device/Increase time);With upper extremity assist;To chair/3-in-1 Details for Transfer Assistance: Demos good stability, but occasional cues for use of crutches curing transfers.   Ambulation/Gait Ambulation/Gait Assistance: 6: Modified independent (Device/Increase time) Ambulation Distance (Feet): 100 Feet Assistive device: Crutches Ambulation/Gait Assistance Details: Good use of crutches.   Gait  Pattern: Step-through pattern Stairs: No Wheelchair Mobility Wheelchair Mobility: No    Shoulder Instructions     Exercises     PT Diagnosis:    PT Problem List:   PT Treatment Interventions:     PT Goals    Visit Information  Last PT Received On: 04/29/12 Assistance Needed: +1    Subjective Data  Subjective: The only thing I'm worried about is being able to shower.   Patient Stated Goal: Home   Prior Functioning  Home Living Lives With:  (2 and 72 yr old children and ? if boyfriend will move back in) Available Help at Discharge: Family;Available PRN/intermittently (pt's mother able to check in on pt daily) Type of Home: House Home Access: Level entry Home Layout: One level Bathroom Shower/Tub: Tub/shower unit;Door [employer] Toilet: Standard Home Adaptive Equipment: Crutches Prior Function Level of Independence: Independent Able to Take Stairs?: Yes Driving: Yes Communication Communication: No difficulties    Cognition  Overall Cognitive Status: Appears within functional limits for tasks assessed/performed Arousal/Alertness: Awake/alert Orientation Level: Appears intact for tasks assessed Behavior During Session: [facility] for tasks performed    Extremity/Trunk Assessment Right Lower Extremity Assessment RLE ROM/Strength/Tone: Within functional levels RLE Sensation: WFL - Light Touch Left Lower Extremity Assessment LLE ROM/Strength/Tone: Deficits LLE ROM/Strength/Tone Deficits: AROM WFL, but strength limited by pain.   LLE Sensation: WFL - Light Touch Trunk Assessment Trunk Assessment: Normal   Balance Balance Balance Assessed: No  End of Session PT - End of Session Equipment Utilized During Treatment: Gait belt Activity Tolerance: Patient limited by pain Patient left: in chair;with call bell/phone within reach Nurse Communication: Mobility status  GP  Functional Assessment Tool Used: Clinical Judgement Functional Limitation: Mobility: Walking and moving  around Mobility: Walking and Moving Around Current Status [phone]): At least 1 percent but less than 20 percent impaired, limited or restricted Mobility: Walking and Moving Around Goal Status [phone]): At least 1 percent but less than 20 percent impaired, limited or restricted Mobility: Walking and Moving Around Discharge Status [phone]): At least 1 percent but less than 20 percent impaired, limited or restricted   Sunny Schlein, [place] [phone] 04/29/2012, 11:55 AM

## 2012-05-04 NOTE — Discharge Summary (Signed)
Physician Discharge Summary  Patient ID: Brianna Bowman MRN: [id number] DOB/AGE: [birth year] 41 y.o.  Admit date: 04/28/2012 Discharge date: 04/29/2012  Admission Diagnoses:  Closed fracture of left ankle  Discharge Diagnoses:  Principal Problem:  *Closed fracture of left ankle   Past Medical History  Diagnosis Date  . Depression   . PONV (postoperative nausea and vomiting)     with epidural  . Anemia     with pregnancy    Surgeries: Procedure(s): OPEN REDUCTION INTERNAL FIXATION (ORIF)left ANKLE FRACTURE on 04/28/2012   Consultants (if any):  none  Discharged Condition: Improved  Hospital Course: Brianna Bowman is an 41 y.o. female who was admitted 04/28/2012 with a diagnosis of Closed fracture of left ankle and went to the operating room on 04/28/2012 and underwent the above named procedures.    She was given perioperative antibiotics:  Anti-infectives     Start     Dose/Rate Route Frequency Ordered Stop   04/28/12 2200   clindamycin (CLEOCIN) IVPB 600 mg        600 mg 100 mL/hr over 30 Minutes Intravenous Every 6 hours 04/28/12 2038 04/29/12 1039   04/28/12 1445   clindamycin (CLEOCIN) IVPB 600 mg        600 mg 100 mL/hr over 30 Minutes Intravenous To Surgery 04/28/12 1411 04/28/12 1705        .  She was given sequential compression devices, early ambulation, and aspirin for DVT prophylaxis.  She benefited maximally from the hospital stay and there were no complications.    Recent vital signs:  Filed Vitals:   04/29/12 0600  BP: 91/56  Pulse: 73  Temp: 98.3 F (36.8 C)  Resp: 18    Recent laboratory studies:  Lab Results  Component Value Date   HGB 12.2 04/28/2012   HGB 10.1* 08/22/2009   HGB 11.7* 08/21/2009   Lab Results  Component Value Date   WBC 8.0 04/28/2012   PLT 274 04/28/2012   No results found for this basename: INR   Lab Results  Component Value Date   NA 138 04/28/2012   K 3.8 04/28/2012   CL 106 04/28/2012   CO2 24 04/28/2012   BUN 8  04/28/2012   CREATININE 0.61 04/28/2012   GLUCOSE 109* 04/28/2012    Discharge Medications:     Medication List     As of 05/04/2012  3:59 PM    TAKE these medications         aspirin EC 325 MG tablet   Take 1 tablet (325 mg total) by mouth daily. To prevent blood clots      BC HEADACHE POWDER PO   Take 1 Package by mouth 2 (two) times daily as needed. For headache      ibuprofen 800 MG tablet   Commonly known as: ADVIL,MOTRIN   Take 800 mg by mouth 3 (three) times daily as needed. For pain      methocarbamol 500 MG tablet   Commonly known as: ROBAXIN   Take 1 tablet (500 mg total) by mouth 4 (four) times daily.      multivitamin with minerals Tabs   Take 1 tablet by mouth daily.      oxyCODONE-acetaminophen 5-325 MG per tablet   Commonly known as: PERCOCET/ROXICET   Take 1 tablet by mouth every 4 (four) hours as needed for pain.      oxyCODONE-acetaminophen 5-325 MG per tablet   Commonly known as: PERCOCET/ROXICET   Take 2 tablets by mouth  every 4 (four) hours as needed for pain.        Diagnostic Studies: Dg Chest 2 View  04/28/2012  *RADIOLOGY REPORT*  Clinical Data: Preop for ankle fracture repair.  CHEST - 2 VIEW  Comparison: None.  Findings: Cardiomediastinal silhouette appears normal.  No acute pulmonary disease is noted.  Bony thorax is intact.  IMPRESSION: No acute cardiopulmonary abnormality seen.   Original Report Authenticated By: Lupita Raider.,  M.D.     Disposition: 01-Home or Self Care      Discharge Orders    Future Orders Please Complete By Expires   Diet - low sodium heart healthy      Call MD / Call 911      Comments:   If you experience chest pain or shortness of breath, CALL 911 and be transported to the hospital emergency room.  If you develope a fever above 101 F, pus (white drainage) or increased drainage or redness at the wound, or calf pain, call your surgeon's office.   Constipation Prevention      Comments:   Drink plenty of fluids.   Prune juice may be helpful.  You may use a stool softener, such as Colace (over the counter) 100 mg twice a day.  Use MiraLax (over the counter) for constipation as needed.   Increase activity slowly as tolerated      Discharge instructions      Comments:   Non weight bearing.  Keep splint dry and clean at all times.  Elevation of leg above heart when at rest.      Follow-up Information    Follow up with Eldred Manges, MD. Schedule an appointment as soon as possible for a visit in 2 weeks.   Contact information:   [address] [place] [address] [phone]           Signed: Wende Neighbors 05/04/2012, 3:59 PM

## 2012-10-29 ENCOUNTER — Other Ambulatory Visit (HOSPITAL_COMMUNITY): Payer: Self-pay | Admitting: Orthopaedic Surgery

## 2012-10-29 ENCOUNTER — Encounter (HOSPITAL_COMMUNITY): Payer: Self-pay | Admitting: *Deleted

## 2012-11-01 ENCOUNTER — Encounter (HOSPITAL_COMMUNITY): Payer: Self-pay | Admitting: Certified Registered"

## 2012-11-01 ENCOUNTER — Inpatient Hospital Stay (HOSPITAL_COMMUNITY)
Admission: RE | Admit: 2012-11-01 | Discharge: 2012-11-03 | DRG: 909 | Disposition: A | Discharge status: Home / Self Care | Payer: Medicaid Other | Source: Ambulatory Visit | Attending: Orthopaedic Surgery | Admitting: Orthopaedic Surgery

## 2012-11-01 ENCOUNTER — Encounter (HOSPITAL_COMMUNITY): Payer: Self-pay

## 2012-11-01 ENCOUNTER — Encounter (HOSPITAL_COMMUNITY)
Admission: RE | Disposition: A | Discharge status: Home / Self Care | Payer: Self-pay | Source: Ambulatory Visit | Attending: Orthopaedic Surgery

## 2012-11-01 ENCOUNTER — Ambulatory Visit (HOSPITAL_COMMUNITY): Payer: Medicaid Other | Admitting: Certified Registered"

## 2012-11-01 DIAGNOSIS — F1210 Cannabis abuse, uncomplicated: Secondary | ICD-10-CM | POA: Diagnosis present

## 2012-11-01 DIAGNOSIS — Z9119 Patient's noncompliance with other medical treatment and regimen: Secondary | ICD-10-CM

## 2012-11-01 DIAGNOSIS — Z472 Encounter for removal of internal fixation device: Secondary | ICD-10-CM

## 2012-11-01 DIAGNOSIS — M869 Osteomyelitis, unspecified: Secondary | ICD-10-CM

## 2012-11-01 DIAGNOSIS — F17200 Nicotine dependence, unspecified, uncomplicated: Secondary | ICD-10-CM | POA: Diagnosis present

## 2012-11-01 DIAGNOSIS — F411 Generalized anxiety disorder: Secondary | ICD-10-CM | POA: Diagnosis present

## 2012-11-01 DIAGNOSIS — Y838 Other surgical procedures as the cause of abnormal reaction of the patient, or of later complication, without mention of misadventure at the time of the procedure: Secondary | ICD-10-CM | POA: Diagnosis present

## 2012-11-01 DIAGNOSIS — F3289 Other specified depressive episodes: Secondary | ICD-10-CM | POA: Diagnosis present

## 2012-11-01 DIAGNOSIS — F329 Major depressive disorder, single episode, unspecified: Secondary | ICD-10-CM | POA: Diagnosis present

## 2012-11-01 DIAGNOSIS — T8189XA Other complications of procedures, not elsewhere classified, initial encounter: Principal | ICD-10-CM | POA: Diagnosis present

## 2012-11-01 DIAGNOSIS — Z91199 Patient's noncompliance with other medical treatment and regimen due to unspecified reason: Secondary | ICD-10-CM

## 2012-11-01 DIAGNOSIS — B958 Unspecified staphylococcus as the cause of diseases classified elsewhere: Secondary | ICD-10-CM | POA: Diagnosis present

## 2012-11-01 HISTORY — DX: Anxiety disorder, unspecified: F41.9

## 2012-11-01 HISTORY — DX: Headache: R51

## 2012-11-01 HISTORY — PX: HARDWARE REMOVAL: SHX979

## 2012-11-01 LAB — COMPREHENSIVE METABOLIC PANEL
AST: 11 U/L (ref 0–37)
Albumin: 3.6 g/dL (ref 3.5–5.2)
Chloride: 104 mEq/L (ref 96–112)
Creatinine, Ser: 0.86 mg/dL (ref 0.50–1.10)
Total Bilirubin: 0.2 mg/dL — ABNORMAL LOW (ref 0.3–1.2)
Total Protein: 6.8 g/dL (ref 6.0–8.3)

## 2012-11-01 LAB — SURGICAL PCR SCREEN
MRSA, PCR: NEGATIVE
Staphylococcus aureus: POSITIVE — AB

## 2012-11-01 LAB — CBC
MCHC: 33.8 g/dL (ref 30.0–36.0)
MCV: 91.6 fL (ref 78.0–100.0)
Platelets: 319 10*3/uL (ref 150–400)
RDW: 13.6 % (ref 11.5–15.5)
WBC: 7.7 10*3/uL (ref 4.0–10.5)

## 2012-11-01 LAB — SEDIMENTATION RATE: Sed Rate: 18 mm/hr (ref 0–22)

## 2012-11-01 SURGERY — REMOVAL, HARDWARE
Anesthesia: General | Site: Ankle | Laterality: Left | Wound class: Dirty or Infected

## 2012-11-01 MED ORDER — LACTATED RINGERS IV SOLN
INTRAVENOUS | Status: DC
  Administered 2012-11-01: 17:00:00 via INTRAVENOUS

## 2012-11-01 MED ORDER — SODIUM CHLORIDE 0.9 % IV SOLN
INTRAVENOUS | Status: DC | PRN
  Administered 2012-11-01: 19:00:00 via INTRAVENOUS

## 2012-11-01 MED ORDER — HYDROMORPHONE HCL PF 1 MG/ML IJ SOLN
INTRAMUSCULAR | Status: AC
  Filled 2012-11-01: qty 1

## 2012-11-01 MED ORDER — OXYCODONE-ACETAMINOPHEN 5-325 MG PO TABS
1.0000 | ORAL_TABLET | ORAL | Status: DC | PRN
  Administered 2012-11-02 – 2012-11-03 (×6): 2 via ORAL
  Filled 2012-11-01 (×6): qty 2

## 2012-11-01 MED ORDER — ONDANSETRON HCL 4 MG/2ML IJ SOLN
INTRAMUSCULAR | Status: DC | PRN
  Administered 2012-11-01: 4 mg via INTRAVENOUS

## 2012-11-01 MED ORDER — ONDANSETRON HCL 4 MG/2ML IJ SOLN
4.0000 mg | Freq: Four times a day (QID) | INTRAMUSCULAR | Status: DC | PRN
  Administered 2012-11-01 – 2012-11-02 (×2): 4 mg via INTRAVENOUS
  Filled 2012-11-01 (×2): qty 2

## 2012-11-01 MED ORDER — HYDROMORPHONE HCL PF 1 MG/ML IJ SOLN
0.2500 mg | INTRAMUSCULAR | Status: DC | PRN
  Administered 2012-11-01 (×4): 0.5 mg via INTRAVENOUS

## 2012-11-01 MED ORDER — VANCOMYCIN HCL IN DEXTROSE 1-5 GM/200ML-% IV SOLN
1000.0000 mg | Freq: Two times a day (BID) | INTRAVENOUS | Status: AC
  Administered 2012-11-02: 1000 mg via INTRAVENOUS
  Filled 2012-11-01: qty 200

## 2012-11-01 MED ORDER — METOCLOPRAMIDE HCL 10 MG PO TABS
5.0000 mg | ORAL_TABLET | Freq: Three times a day (TID) | ORAL | Status: DC | PRN
Start: 2012-11-01 — End: 2012-11-03

## 2012-11-01 MED ORDER — METOCLOPRAMIDE HCL 5 MG/ML IJ SOLN
5.0000 mg | Freq: Three times a day (TID) | INTRAMUSCULAR | Status: DC | PRN
  Administered 2012-11-02: 10 mg via INTRAVENOUS
  Filled 2012-11-01: qty 2

## 2012-11-01 MED ORDER — SODIUM CHLORIDE 0.9 % IV SOLN
INTRAVENOUS | Status: DC
  Administered 2012-11-01 – 2012-11-02 (×2): via INTRAVENOUS

## 2012-11-01 MED ORDER — VANCOMYCIN HCL IN DEXTROSE 1-5 GM/200ML-% IV SOLN
1000.0000 mg | Freq: Once | INTRAVENOUS | Status: AC
  Administered 2012-11-01: 1000 mg via INTRAVENOUS

## 2012-11-01 MED ORDER — HYDROMORPHONE HCL PF 1 MG/ML IJ SOLN
0.5000 mg | INTRAMUSCULAR | Status: DC | PRN
  Administered 2012-11-02 (×2): 1 mg via INTRAVENOUS
  Filled 2012-11-01 (×2): qty 1

## 2012-11-01 MED ORDER — LIDOCAINE HCL (CARDIAC) 20 MG/ML IV SOLN
INTRAVENOUS | Status: DC | PRN
  Administered 2012-11-01: 40 mg via INTRAVENOUS

## 2012-11-01 MED ORDER — MUPIROCIN 2 % EX OINT
TOPICAL_OINTMENT | Freq: Two times a day (BID) | CUTANEOUS | Status: DC
  Administered 2012-11-01 – 2012-11-03 (×4): via NASAL

## 2012-11-01 MED ORDER — OXYCODONE HCL 5 MG/5ML PO SOLN: 5.0000 mg | Freq: Once | ORAL | Status: DC | PRN

## 2012-11-01 MED ORDER — ENOXAPARIN SODIUM 40 MG/0.4ML ~~LOC~~ SOLN
40.0000 mg | SUBCUTANEOUS | Status: DC
  Administered 2012-11-02 – 2012-11-03 (×2): 40 mg via SUBCUTANEOUS
  Filled 2012-11-01 (×3): qty 0.4

## 2012-11-01 MED ORDER — BUPIVACAINE-EPINEPHRINE PF 0.25-1:200000 % IJ SOLN
INTRAMUSCULAR | Status: DC | PRN
  Administered 2012-11-01: 6 mL

## 2012-11-01 MED ORDER — PROPOFOL 10 MG/ML IV BOLUS
INTRAVENOUS | Status: DC | PRN
  Administered 2012-11-01: 120 mg via INTRAVENOUS

## 2012-11-01 MED ORDER — ONDANSETRON HCL 4 MG PO TABS: 4.0000 mg | ORAL_TABLET | Freq: Four times a day (QID) | ORAL | Status: DC | PRN

## 2012-11-01 MED ORDER — MIDAZOLAM HCL 5 MG/5ML IJ SOLN
INTRAMUSCULAR | Status: DC | PRN
  Administered 2012-11-01: 2 mg via INTRAVENOUS

## 2012-11-01 MED ORDER — VANCOMYCIN HCL IN DEXTROSE 1-5 GM/200ML-% IV SOLN
INTRAVENOUS | Status: AC
  Filled 2012-11-01: qty 200

## 2012-11-01 MED ORDER — TEMAZEPAM 15 MG PO CAPS: 15.0000 mg | ORAL_CAPSULE | Freq: Every evening | ORAL | Status: DC | PRN

## 2012-11-01 MED ORDER — ONDANSETRON HCL 4 MG/2ML IJ SOLN: 4.0000 mg | Freq: Four times a day (QID) | INTRAMUSCULAR | Status: DC | PRN

## 2012-11-01 MED ORDER — METHOCARBAMOL 500 MG PO TABS
500.0000 mg | ORAL_TABLET | Freq: Four times a day (QID) | ORAL | Status: DC | PRN
  Administered 2012-11-02 – 2012-11-03 (×3): 500 mg via ORAL
  Filled 2012-11-01 (×3): qty 1

## 2012-11-01 MED ORDER — BUPIVACAINE-EPINEPHRINE PF 0.25-1:200000 % IJ SOLN
INTRAMUSCULAR | Status: AC
  Filled 2012-11-01: qty 30

## 2012-11-01 MED ORDER — DIPHENHYDRAMINE HCL 25 MG PO TABS
25.0000 mg | ORAL_TABLET | Freq: Every evening | ORAL | Status: DC | PRN
  Administered 2012-11-03: 25 mg via ORAL
  Filled 2012-11-01 (×2): qty 1

## 2012-11-01 MED ORDER — EPHEDRINE SULFATE 50 MG/ML IJ SOLN
INTRAMUSCULAR | Status: DC | PRN
  Administered 2012-11-01: 10 mg via INTRAVENOUS

## 2012-11-01 MED ORDER — METHOCARBAMOL 100 MG/ML IJ SOLN
500.0000 mg | Freq: Four times a day (QID) | INTRAVENOUS | Status: DC | PRN
  Filled 2012-11-01: qty 5

## 2012-11-01 MED ORDER — MUPIROCIN 2 % EX OINT
TOPICAL_OINTMENT | CUTANEOUS | Status: AC
  Administered 2012-11-01: 1 via NASAL
  Filled 2012-11-01: qty 22

## 2012-11-01 MED ORDER — 0.9 % SODIUM CHLORIDE (POUR BTL) OPTIME
TOPICAL | Status: DC | PRN
  Administered 2012-11-01: 250 mL

## 2012-11-01 MED ORDER — LACTATED RINGERS IV SOLN
INTRAVENOUS | Status: DC | PRN
  Administered 2012-11-01 (×2): via INTRAVENOUS

## 2012-11-01 MED ORDER — OXYCODONE HCL 5 MG PO TABS: 5.0000 mg | ORAL_TABLET | Freq: Once | ORAL | Status: DC | PRN

## 2012-11-01 MED ORDER — FENTANYL CITRATE 0.05 MG/ML IJ SOLN
INTRAMUSCULAR | Status: DC | PRN
  Administered 2012-11-01 (×5): 25 ug via INTRAVENOUS
  Administered 2012-11-01: 50 ug via INTRAVENOUS
  Administered 2012-11-01: 25 ug via INTRAVENOUS
  Administered 2012-11-01: 50 ug via INTRAVENOUS

## 2012-11-01 SURGICAL SUPPLY — 35 items
BANDAGE ELASTIC 4 VELCRO ST LF (GAUZE/BANDAGES/DRESSINGS) ×2 IMPLANT
BANDAGE GAUZE ELAST BULKY 4 IN (GAUZE/BANDAGES/DRESSINGS) ×2 IMPLANT
CLOTH BEACON ORANGE TIMEOUT ST (SAFETY) ×2 IMPLANT
CONT SPEC STER OR (MISCELLANEOUS) ×2 IMPLANT
COVER SURGICAL LIGHT HANDLE (MISCELLANEOUS) ×2 IMPLANT
DRAPE ORTHO SPLIT 77X108 STRL (DRAPES)
DRAPE SURG ORHT 6 SPLT 77X108 (DRAPES) IMPLANT
ELECT REM PT RETURN 9FT ADLT (ELECTROSURGICAL) ×2
ELECTRODE REM PT RTRN 9FT ADLT (ELECTROSURGICAL) ×1 IMPLANT
GAUZE XEROFORM 1X8 LF (GAUZE/BANDAGES/DRESSINGS) ×2 IMPLANT
GLOVE BIO SURGEON STRL SZ8 (GLOVE) ×2 IMPLANT
GLOVE BIOGEL PI IND STRL 7.5 (GLOVE) ×1 IMPLANT
GLOVE BIOGEL PI IND STRL 8 (GLOVE) ×1 IMPLANT
GLOVE BIOGEL PI INDICATOR 7.5 (GLOVE) ×1
GLOVE BIOGEL PI INDICATOR 8 (GLOVE) ×1
GLOVE ORTHO TXT STRL SZ7.5 (GLOVE) ×2 IMPLANT
GOWN STRL NON-REIN LRG LVL3 (GOWN DISPOSABLE) ×4 IMPLANT
KIT BASIN OR (CUSTOM PROCEDURE TRAY) ×2 IMPLANT
KIT ROOM TURNOVER OR (KITS) ×2 IMPLANT
NEEDLE HYPO 25GX1X1/2 BEV (NEEDLE) ×2 IMPLANT
PACK GENERAL/GYN (CUSTOM PROCEDURE TRAY) ×2 IMPLANT
PAD ARMBOARD 7.5X6 YLW CONV (MISCELLANEOUS) ×4 IMPLANT
PAD CAST 4YDX4 CTTN HI CHSV (CAST SUPPLIES) ×1 IMPLANT
PADDING CAST ABS 4INX4YD NS (CAST SUPPLIES) ×2
PADDING CAST ABS COTTON 4X4 ST (CAST SUPPLIES) ×2 IMPLANT
PADDING CAST COTTON 4X4 STRL (CAST SUPPLIES) ×1
SPONGE GAUZE 4X4 12PLY (GAUZE/BANDAGES/DRESSINGS) ×2 IMPLANT
STAPLER VISISTAT 35W (STAPLE) ×2 IMPLANT
SUT VIC AB 0 CT1 27 (SUTURE) ×1
SUT VIC AB 0 CT1 27XBRD ANBCTR (SUTURE) ×1 IMPLANT
SUT VIC AB 2-0 CT1 27 (SUTURE) ×1
SUT VIC AB 2-0 CT1 TAPERPNT 27 (SUTURE) ×1 IMPLANT
SYR CONTROL 10ML LL (SYRINGE) ×2 IMPLANT
TOWEL OR 17X24 6PK STRL BLUE (TOWEL DISPOSABLE) ×2 IMPLANT
TOWEL OR 17X26 10 PK STRL BLUE (TOWEL DISPOSABLE) ×2 IMPLANT

## 2012-11-01 NOTE — Brief Op Note (Signed)
11/01/2012  8:28 PM  PATIENT:  Brianna Bowman  42 y.o. female  PRE-OPERATIVE DIAGNOSIS:  Left Ankle Lateral Malleolus Infection  POST-OPERATIVE DIAGNOSIS:  Left Ankle Lateral Malleolus Infection   PROCEDURE:  Procedure(s) with comments: HARDWARE REMOVAL (Left) - Left Ankle Removal Fibula Plate and 10 screws, Irrigation and Debridement  , removal of medial ankle hardware.   SURGEON:  Surgeon(s) and Role:    * Eldred Manges, MD - Primary  PHYSICIAN ASSISTANT:   ASSISTANTS: none   ANESTHESIA:   local and general  EBL:  Total I/O In: 1525 [I.V.:1525] Out: 20 [Blood:20]  BLOOD ADMINISTERED:none  DRAINS: none   LOCAL MEDICATIONS USED:  MARCAINE     SPECIMEN:  No Specimen  DISPOSITION OF SPECIMEN:  N/A  COUNTS:  YES  TOURNIQUET:  * No tourniquets in log *  DICTATION: .Other Dictation: Dictation Number [id number]  PLAN OF CARE: Admit to inpatient   PATIENT DISPOSITION:  PACU - hemodynamically stable.   Delay start of Pharmacological VTE agent (>24hrs) due to surgical blood loss or risk of bleeding: yes

## 2012-11-01 NOTE — Anesthesia Postprocedure Evaluation (Signed)
  Anesthesia Post-op Note  Patient: Brianna Bowman  Procedure(s) Performed: Procedure(s) with comments: HARDWARE REMOVAL (Left) - Left Ankle Removal Fibula Plate and 10 screws, Irrigation and Debridement  Patient Location: PACU  Anesthesia Type:General  Level of Consciousness: awake, alert  and oriented  Airway and Oxygen Therapy: Patient Spontanous Breathing and Patient connected to nasal cannula oxygen  Post-op Pain: mild  Post-op Assessment: Post-op Vital signs reviewed, Patient's Cardiovascular Status Stable, Respiratory Function Stable, Patent Airway and No signs of Nausea or vomiting  Post-op Vital Signs: Reviewed and stable  Complications: No apparent anesthesia complications

## 2012-11-01 NOTE — Anesthesia Preprocedure Evaluation (Addendum)
Anesthesia Evaluation  Patient identified by MRN, date of birth, ID band Patient awake    Reviewed: Allergy & Precautions, H&P , NPO status , Patient's Chart, lab work & pertinent test results  History of Anesthesia Complications (+) PONV  Airway Mallampati: II  Neck ROM: full    Dental  (+) Poor Dentition and Dental Advisory Given   Pulmonary Current Smoker,          Cardiovascular negative cardio ROS      Neuro/Psych  Headaches, PSYCHIATRIC DISORDERS Anxiety Depression    GI/Hepatic negative GI ROS, Neg liver ROS,   Endo/Other  negative endocrine ROS  Renal/GU negative Renal ROS     Musculoskeletal   Abdominal   Peds  Hematology negative hematology ROS (+)   Anesthesia Other Findings   Reproductive/Obstetrics negative OB ROS                          Anesthesia Physical Anesthesia Plan  ASA: II  Anesthesia Plan: General   Post-op Pain Management:    Induction: Intravenous  Airway Management Planned: LMA  Additional Equipment:   Intra-op Plan:   Post-operative Plan:   Informed Consent: I have reviewed the patients History and Physical, chart, labs and discussed the procedure including the risks, benefits and alternatives for the proposed anesthesia with the patient or authorized representative who has indicated his/her understanding and acceptance.     Plan Discussed with: CRNA, Anesthesiologist and Surgeon  Anesthesia Plan Comments:         Anesthesia Quick Evaluation

## 2012-11-01 NOTE — H&P (Signed)
Brianna Bowman is an 42 y.o. female.   Chief Complaint: left ankle pain and drainage. HPI: Pt fell and sustained a left ankle fracture November 2013 which was treated with ORIF. Pt had radiographic evidence of delayed healing.  Recently she developed drainage from the lateral incision.  Seen in office on May 9 and Dr Ophelia Charter expressed purulent fluid that was sent for culture.  Pt placed on Doxycycline.  As of today the initial culture report showed moderate Staph.  Final culture pending.  Pt admitted for removal of hardware of the left ankle and I&D as needed.  Pt will be given Vancomycin preop  Past Medical History  Diagnosis Date  . Depression   . Anemia     with pregnancy  . PONV (postoperative nausea and vomiting)     with epidural anad 04/28/13 BP low and took longer to wake after surgery 04/28/2012  . Anxiety     due to condition  . Headache     history of    Past Surgical History  Procedure Laterality Date  . Fracture surgery      L femur  . Orif ankle fracture  04/28/2012    Procedure: OPEN REDUCTION INTERNAL FIXATION (ORIF) ANKLE FRACTURE;  Surgeon: Eldred Manges, MD;  Location: [facility] OR;  Service: Orthopedics;  Laterality: Left;  Open reduction internal fixation left ankle bimalleolar fracture  . Mandible fracture surgery      History reviewed. No pertinent family history. Social History:  reports that she has been smoking.  She does not have any smokeless tobacco history on file. She reports that  drinks alcohol. She reports that she uses illicit drugs (Marijuana).  Allergies:  Allergies  Allergen Reactions  . Amoxicillin Anaphylaxis  . Adhesive (Tape) Hives  . Aloe Hives    Medications Prior to Admission  Medication Sig Dispense Refill  . Aspirin-Salicylamide-Caffeine (BC HEADACHE POWDER PO) Take 1 Package by mouth daily as needed (for pain).       Marland Kitchen diphenhydrAMINE (BENADRYL) 25 MG tablet Take 25 mg by mouth at bedtime as needed for itching.      Marland Kitchen doxycycline (VIBRAMYCIN)  100 MG capsule Take 100 mg by mouth 2 (two) times daily. Filled 10/30/12 7 day course        Results for orders placed during the hospital encounter of 11/01/12 (from the past 48 hour(s))  SEDIMENTATION RATE     Status: None   Collection Time    11/01/12  3:02 PM      Result Value Range   Sed Rate 18  0 - 22 mm/hr  CBC     Status: None   Collection Time    11/01/12  3:02 PM      Result Value Range   WBC 7.7  4.0 - 10.5 K/uL   RBC 4.07  3.87 - 5.11 MIL/uL   Hemoglobin 12.6  12.0 - 15.0 g/dL   HCT 45.4  09.8 - 11.9 %   MCV 91.6  78.0 - 100.0 fL   MCH 31.0  26.0 - 34.0 pg   MCHC 33.8  30.0 - 36.0 g/dL   RDW 14.7  82.9 - 56.2 %   Platelets 319  150 - 400 K/uL  COMPREHENSIVE METABOLIC PANEL     Status: Abnormal   Collection Time    11/01/12  3:02 PM      Result Value Range   Sodium 138  135 - 145 mEq/L   Potassium 3.9  3.5 - 5.1 mEq/L  Chloride 104  96 - 112 mEq/L   CO2 24  19 - 32 mEq/L   Glucose, Bld 96  70 - 99 mg/dL   BUN 11  6 - 23 mg/dL   Creatinine, Ser 1.61  0.50 - 1.10 mg/dL   Calcium 09.6  8.4 - 04.5 mg/dL   Total Protein 6.8  6.0 - 8.3 g/dL   Albumin 3.6  3.5 - 5.2 g/dL   AST 11  0 - 37 U/L   ALT 9  0 - 35 U/L   Alkaline Phosphatase 60  39 - 117 U/L   Total Bilirubin 0.2 (*) 0.3 - 1.2 mg/dL   GFR calc non Af Amer 83 (*) >90 mL/min   GFR calc Af Amer >90  >90 mL/min   Comment:            The eGFR has been calculated     using the CKD EPI equation.     This calculation has not been     validated in all clinical     situations.     eGFR's persistently     <90 mL/min signify     possible Chronic Kidney Disease.  HCG, SERUM, QUALITATIVE     Status: None   Collection Time    11/01/12  3:02 PM      Result Value Range   Preg, Serum NEGATIVE  NEGATIVE   Comment:            THE SENSITIVITY OF THIS     METHODOLOGY IS >10 mIU/mL.   No results found.  Review of Systems  Musculoskeletal:       Left ankle pain and drainage.  All other systems reviewed and are  negative.    Blood pressure 101/67, pulse 96, temperature 98.5 F (36.9 C), temperature source Oral, resp. rate 16, height 5\' 5"  (1.651 m), weight 49.102 kg (108 lb 4 oz), last menstrual period 10/20/2012, SpO2 99.00%. Physical Exam  Constitutional: She is oriented to person, place, and time. She appears well-developed and well-nourished.  HENT:  Head: Normocephalic and atraumatic.  Eyes: EOM are normal. Pupils are equal, round, and reactive to light.  Cardiovascular: Normal rate.   Respiratory: Effort normal.  GI: Soft.  Musculoskeletal:  Lateral incision with drainage.  Mild edema of left ankle.  Medial incision without drainage or erythema.  Neurological: She is alert and oriented to person, place, and time.  Skin: Skin is warm and dry.  Psychiatric: She has a normal mood and affect.     Assessment/Plan Infected retained hardware of left ankle.  PLAN:  Removal of hardware of left ankle.  Brianna Bowman C 11/01/2012, 6:17 PM

## 2012-11-01 NOTE — Transfer of Care (Signed)
Immediate Anesthesia Transfer of Care Note  Patient: Brianna Bowman  Procedure(s) Performed: Procedure(s) with comments: HARDWARE REMOVAL (Left) - Left Ankle Removal Fibula Plate and 10 screws, Irrigation and Debridement  Patient Location: PACU  Anesthesia Type:General  Level of Consciousness: awake, alert  and oriented  Airway & Oxygen Therapy: Patient Spontanous Breathing and Patient connected to nasal cannula oxygen  Post-op Assessment: Report given to PACU RN and Post -op Vital signs reviewed and stable  Post vital signs: Reviewed and stable  Complications: No apparent anesthesia complications

## 2012-11-01 NOTE — Interval H&P Note (Signed)
History and Physical Interval Note:  11/01/2012 6:18 PM  [name]  has presented today for surgery, with the diagnosis of Left Ankle Lateral Malleolus Infection  The various methods of treatment have been discussed with the patient and family. After consideration of risks, benefits and other options for treatment, the patient has consented to  Procedure(s) with comments: HARDWARE REMOVAL (Left) - Left Ankle Removal Fibula Plate, Irrigation and Debridement, Possible VAC as a surgical intervention .  The patient's history has been reviewed, patient examined, no change in status, stable for surgery.  I have reviewed the patient's chart and labs.  Questions were answered to the patient's satisfaction.   Plan removal of MEDIAL AND LATERAL HARDWARE PER PT REQUEST.    Preliminary cultures Staph.   Brianna Bowman

## 2012-11-01 NOTE — Preoperative (Signed)
Beta Blockers   Reason not to administer Beta Blockers:Not Applicable 

## 2012-11-02 ENCOUNTER — Encounter (HOSPITAL_COMMUNITY): Payer: Self-pay | Admitting: Orthopaedic Surgery

## 2012-11-02 MED ORDER — SODIUM CHLORIDE 0.9 % IJ SOLN
10.0000 mL | INTRAMUSCULAR | Status: DC | PRN
  Administered 2012-11-02 – 2012-11-03 (×4): 10 mL

## 2012-11-02 NOTE — Evaluation (Signed)
Physical Therapy Evaluation Patient Details Name: Brianna Bowman MRN: [id number] DOB: [birth year] Today's Date: 11/02/2012 Time: 4098-1191 PT Time Calculation (min): 15 min  PT Assessment / Plan / Recommendation Clinical Impression  Pt is 42 y/o female admitted for s/p left ankle hardware removal due and I&D due to infection.  Pt limited due to nausea and CAM walker not present.  Pt will benefit from acute PT services to improve overall mobility and prepare for safe d/c home.  Pt used crutches previously however may benefit from RW.  Will continue to assess.    PT Assessment  Patient needs continued PT services    Follow Up Recommendations  No PT follow up    Barriers to Discharge None      Equipment Recommendations  Rolling walker with 5" wheels (May need RW)    Frequency Min 5X/week    Precautions / Restrictions Precautions Precautions: Fall Required Braces or Orthoses: Other Brace/Splint (cam walker) Restrictions Weight Bearing Restrictions: Yes LLE Weight Bearing: Weight bearing as tolerated (with CAM boot)   Pertinent Vitals/Pain 6/10 left ankle pain; premedicated      Mobility  Bed Mobility Bed Mobility: Supine to Sit;Sitting - Scoot to Edge of Bed Supine to Sit: 5: Supervision Sitting - Scoot to Edge of Bed: 5: Supervision Details for Bed Mobility Assistance: Supervision for safety.  Transfers Transfers: Sit to Stand;Stand to Sit Sit to Stand: 4: Min guard;With upper extremity assist;From bed Stand to Sit: 4: Min guard;With upper extremity assist;To chair/3-in-1 Details for Transfer Assistance: Minguard for safety. Vc's for hand placement. Minguard for stand pivot to chair. Pt able to keep weight off of LE due to her not having cam walker with her. Ambulation/Gait Ambulation/Gait Assistance: 4: Min guard Ambulation Distance (Feet): 5 Feet Assistive device: Rolling walker Ambulation/Gait Assistance Details: Pt maintained NWB due to CAM walker not present on eval.   However mother to bring later today.  VCs for technique and RW placement Gait Pattern: Step-to pattern Stairs: No Wheelchair Mobility Wheelchair Mobility: No    Exercises     PT Diagnosis: Difficulty walking;Generalized weakness;Acute pain  PT Problem List: Decreased strength;Decreased activity tolerance;Decreased balance;Decreased mobility;Decreased knowledge of use of DME;Pain PT Treatment Interventions: DME instruction;Gait training;Stair training;Functional mobility training;Therapeutic activities;Therapeutic exercise;Balance training;Patient/family education   PT Goals Acute Rehab PT Goals PT Goal Formulation: With patient Time For Goal Achievement: 11/09/12 Potential to Achieve Goals: Good Pt will go Sit to Stand: with modified independence PT Goal: Sit to Stand - Progress: Goal set today Pt will go Stand to Sit: with modified independence PT Goal: Stand to Sit - Progress: Goal set today Pt will Ambulate: 16 - 50 feet;with modified independence;with rolling walker PT Goal: Ambulate - Progress: Goal set today Pt will Go Up / Down Stairs: 1-2 stairs;with modified independence;with least restrictive assistive device PT Goal: Up/Down Stairs - Progress: Goal set today  Visit Information  Last PT Received On: 11/02/12 Assistance Needed: +1 PT/OT Co-Evaluation/Treatment: Yes    Subjective Data  Subjective: "I've used the crutches before but they annoyed me so I just hopped around the house without them." Patient Stated Goal: To go home   Prior Functioning  Home Living Lives With: Son (41 y.o.) Available Help at Discharge: Family;Available 24 hours/day (son) Type of Home: House Home Access: Stairs to enter [employer] of Steps: 1 Entrance Stairs-Rails: None Home Layout: Two level;Able to live on main level with bedroom/bathroom Alternate Level Stairs-Number of Steps: 14 Alternate Level Stairs-Rails: Left (for 3/4 way  up) Bathroom Shower/Tub: Tub/shower  unit;Door [employer] Toilet: Pharmacist, community: Yes How Accessible: Accessible via walker Home Adaptive Equipment: Shower chair with back Prior Function Level of Independence: Independent Communication Communication: No difficulties Dominant Hand: Right    Cognition  Cognition Arousal/Alertness: Awake/alert Behavior During Therapy: WFL for tasks assessed/performed Overall Cognitive Status: Within Functional Limits for tasks assessed    Extremity/Trunk Assessment Right Upper Extremity Assessment RUE ROM/Strength/Tone: WFL for tasks assessed Left Upper Extremity Assessment LUE ROM/Strength/Tone: WFL for tasks assessed Right Lower Extremity Assessment RLE ROM/Strength/Tone: Within functional levels Left Lower Extremity Assessment LLE ROM/Strength/Tone: Unable to fully assess;Due to pain   Balance    End of Session PT - End of Session Equipment Utilized During Treatment: Gait belt Activity Tolerance: Patient tolerated treatment well Patient left: in chair;with call bell/phone within reach Nurse Communication: Mobility status  GP     Jaquarious Grey 11/02/2012, 9:34 AM Jake Shark, PT DPT [phone]

## 2012-11-02 NOTE — Progress Notes (Signed)
Peripherally Inserted Central Catheter/Midline Placement  The IV Nurse has discussed with the patient and/or persons authorized to consent for the patient, the purpose of this procedure and the potential benefits and risks involved with this procedure.  The benefits include less needle sticks, lab draws from the catheter and patient may be discharged home with the catheter.  Risks include, but not limited to, infection, bleeding, blood clot (thrombus formation), and puncture of an artery; nerve damage and irregular heat beat.  Alternatives to this procedure were also discussed.  PICC/Midline Placement Documentation        Brianna Bowman 11/02/2012, 5:44 PM

## 2012-11-02 NOTE — Evaluation (Signed)
Occupational Therapy Evaluation Patient Details Name: Brianna Bowman MRN: [id number] DOB: [birth year] Today's Date: 11/02/2012 Time: 9562-1308 OT Time Calculation (min): 14 min  OT Assessment / Plan / Recommendation Clinical Impression    Pt is 42 y/o female admitted for s/p left ankle hardware removal due and I&D due to infection. Pt limited due to nausea and CAM walker not present. Pt will benefit from acute OT services to improve overall independence and prepare for safe d/c home.      OT Assessment  Patient needs continued OT Services    Follow Up Recommendations  No OT follow up;Supervision - Intermittent    Barriers to Discharge      Equipment Recommendations  None recommended by OT    Recommendations for Other Services    Frequency  Min 2X/week    Precautions / Restrictions Precautions Precautions: Fall Required Braces or Orthoses: Other Brace/Splint (cam walker) Restrictions Weight Bearing Restrictions: Yes LLE Weight Bearing: Weight bearing as tolerated (with CAM boot)   Pertinent Vitals/Pain 6/10 left ankle pain; premedicated    ADL  Eating/Feeding: Independent Where Assessed - Eating/Feeding: Chair Grooming: Set up Where Assessed - Grooming: Unsupported sitting Upper Body Bathing: Set up Where Assessed - Upper Body Bathing: Unsupported sitting Lower Body Bathing: Minimal assistance Where Assessed - Lower Body Bathing: Supported sit to stand Upper Body Dressing: Set up Where Assessed - Upper Body Dressing: Unsupported sitting Lower Body Dressing: Minimal assistance Where Assessed - Lower Body Dressing: Supported sit to stand Toilet Transfer: Hydrographic surveyor Method: Surveyor, minerals: Other (comment) (from bed) Toileting - Clothing Manipulation and Hygiene: Minimal assistance Where Assessed - Engineer, mining and Hygiene: Sit to stand from 3-in-1 or toilet Tub/Shower Transfer Method: Not assessed Equipment Used:  Rolling walker ADL Comments: Pt able to reach feet to be able to don/doff socks. Pt at overall Min A for LB ADLs due to balance (pt did not have cam walker today and had to hop on one foot). Talked to pt about tub transfer and pt feels good and safe with transfer as she has been dealing with this ankle after previous surgery.     OT Diagnosis: Acute pain  OT Problem List: Impaired balance (sitting and/or standing);Decreased knowledge of use of DME or AE;Decreased knowledge of precautions;Pain OT Treatment Interventions: Self-care/ADL training;DME and/or AE instruction;Therapeutic activities;Patient/family education;Balance training   OT Goals Acute Rehab OT Goals OT Goal Formulation: With patient Time For Goal Achievement: 11/09/12 Potential to Achieve Goals: Good ADL Goals Pt Will Perform Lower Body Bathing: with modified independence;Sit to stand from chair ADL Goal: Lower Body Bathing - Progress: Goal set today Pt Will Perform Lower Body Dressing: with modified independence;Sit to stand from bed;Sit to stand from chair ADL Goal: Lower Body Dressing - Progress: Goal set today Pt Will Transfer to Toilet: with modified independence;Ambulation;Regular height toilet ADL Goal: Toilet Transfer - Progress: Goal set today Pt Will Perform Toileting - Clothing Manipulation: with modified independence;Standing ADL Goal: Toileting - Clothing Manipulation - Progress: Goal set today Pt Will Perform Toileting - Hygiene: with modified independence;Sit to stand from 3-in-1/toilet;Sitting on 3-in-1 or toilet ADL Goal: Toileting - Hygiene - Progress: Goal set today  Visit Information  Last OT Received On: 11/02/12 Assistance Needed: +1    Subjective Data      Prior Functioning     Home Living Lives With: Son (2 y.o.) Available Help at Discharge: Family;Available 24 hours/day (son) Type of Home: House Home Access: Stairs to  enter Entrance Stairs-Number of Steps: 1 Entrance Stairs-Rails:  None Home Layout: Two level;Able to live on main level with bedroom/bathroom Alternate Level Stairs-Number of Steps: 14 Alternate Level Stairs-Rails: Left (for 3/4 way up) Bathroom Shower/Tub: Tub/shower unit;Door [employer] Toilet: Pharmacist, community: Yes How Accessible: Accessible via walker Home Adaptive Equipment: Shower chair with back Prior Function Level of Independence: Independent Communication Communication: No difficulties Dominant Hand: Right         Vision/Perception     Cognition  Cognition Arousal/Alertness: Awake/alert Behavior During Therapy: WFL for tasks assessed/performed Overall Cognitive Status: Within Functional Limits for tasks assessed    Extremity/Trunk Assessment Right Upper Extremity Assessment RUE ROM/Strength/Tone: WFL for tasks assessed Left Upper Extremity Assessment LUE ROM/Strength/Tone: WFL for tasks assessed     Mobility Bed Mobility Bed Mobility: Supine to Sit;Sitting - Scoot to Edge of Bed Supine to Sit: 5: Supervision Sitting - Scoot to Edge of Bed: 5: Supervision Details for Bed Mobility Assistance: Supervision for safety.  Transfers Transfers: Sit to Stand;Stand to Sit Sit to Stand: 4: Min guard;With upper extremity assist;From bed Stand to Sit: 4: Min guard;With upper extremity assist;To chair/3-in-1 Details for Transfer Assistance: Minguard for safety. Vc's for hand placement. Minguard for stand pivot to chair. Pt able to keep weight off of LE due to her not having cam walker with her.     Exercise     Balance     End of Session OT - End of Session Activity Tolerance: Patient tolerated treatment well Patient left: in chair;with call bell/phone within reach  GO     Earlie Raveling OTR/L [phone] 11/02/2012, 9:45 AM

## 2012-11-02 NOTE — Progress Notes (Signed)
Subjective: 1 Day Post-Op Procedure(s) (LRB): HARDWARE REMOVAL (Left) Patient reports pain as severe.  Reports dilaudid is the only thing that helps.  Up with PT and severe pain  Objective: Vital signs in last 24 hours: Temp:  [97.4 F (36.3 C)-98.5 F (36.9 C)] 97.7 F (36.5 C) (05/13 1300) Pulse Rate:  [59-133] 59 (05/13 1300) Resp:  [13-34] 18 (05/13 1300) BP: (93-117)/(48-83) 93/48 mmHg (05/13 1300) SpO2:  [96 %-100 %] 100 % (05/13 1300) Weight:  [49.102 kg (108 lb 4 oz)] 49.102 kg (108 lb 4 oz) (05/12 1509)  Intake/Output from previous day: 05/12 0701 - 05/13 0700 In: 1725 [P.O.:200; I.V.:1525] Out: 20 [Blood:20] Intake/Output this shift:     Recent Labs  11/01/12 1502  HGB 12.6    Recent Labs  11/01/12 1502  WBC 7.7  RBC 4.07  HCT 37.3  PLT 319    Recent Labs  11/01/12 1502  NA 138  K 3.9  CL 104  CO2 24  BUN 11  CREATININE 0.86  GLUCOSE 96  CALCIUM 10.0   No results found for this basename: LABPT, INR,  in the last 72 hours  cap refill of toes intact.  splint intact.  Assessment/Plan: 1 Day Post-Op Procedure(s) (LRB): HARDWARE REMOVAL (Left) Up with therapy Continue ABX therapy due to Post-op infection.  Cultures of wound of lateral ankle taken in the office prior to admission.  As of last report culture was growing Staph.  Not identified methacillin sensitivity yet.  Currently on IV Vancomycin.  Anticipate prolonged abx coverage and will get PICC line today.  Discussed with pt .  Will plan for discharge when lab report finalized for appropriate abx.  Jamil Armwood M 11/02/2012, 2:57 PM

## 2012-11-02 NOTE — Op Note (Signed)
Brianna Bowman, Brianna Bowman                 ACCOUNT NO.:  [id number]  MEDICAL RECORD NO.:  [id number]  LOCATION:  [id number]                        FACILITY:  [facility]  PHYSICIAN:  Maysun Meditz C. Ophelia Charter, M.D.    DATE OF BIRTH:  [birth year]  DATE OF PROCEDURE:  11/01/2012 DATE OF DISCHARGE:                              OPERATIVE REPORT   PREOPERATIVE DIAGNOSES: 1. Status post open reduction and internal fixation, left. 2. Bimalleolar ankle fracture fixation in November 2013 with late     lateral fibular infection Staph with identification pending.  POSTOPERATIVE DIAGNOSES: 1. Status post open reduction and internal fixation, left. 2. Bimalleolar ankle fracture fixation in November 2013 with late     lateral fibular infection Staph with identification pending.  PROCEDURE: 1. Removal of left ankle medial and lateral hardware. 2. Irrigation, excisional debridement of subcutaneous tissue,     periosteum, and bone of the fibula.  SURGEON:  Eldred Manges, MD.  ANESTHESIA:  General.  TOURNIQUET:  No tourniquet.  TOURNIQUET TIME:  None.  BRIEF HISTORY:  This 42 year old female had a bimalleolar ankle fracture fixed in November 2013.  She was ambulatory with some slight stiffness in the ankle mild lateral pain.  She returned 2 weeks ago states that she has had some intermittent clear drainage from the distal aspect of the fibula incision and then had become increasingly painful, erythematous, and pain with ambulation.  She has still been full weightbearing in her tennis shoe.  X-rays demonstrated delayed consolidation of the fibular fracture.  Medial side looked totally healed medial incision, it was normal, she had no effusion of her ankle and culture was obtained after expressing some purulence which was sent for my office at the end of last week on Friday and preliminary culture results show Staph and Coag positive versus negative is pending as well as MRSA versus MSSA versus Staph epidermidis.  The  patient with doxycycline started after cultures.  No history of MRSA.  PROCEDURE IN DETAIL:  After induction general anesthesia, a time-out procedure.  Vancomycin was being dripped.  No tourniquet was used.  The medial incision was opened.  The patient was thinned, weight 108 pounds, and medial screws easily palpable.  All 4 screws were removed, the two medial malleolar screws and the two interfrag screws that caught the more proximal spike.  Medial fracture was totally healed, bone looked normal.  No evidence of infection.  There was no fusion of the ankle.  After copious irrigation, the wound was reapproximated with 2-0 Vicryl.  Skin staple closure.  Attention was turned laterally.  Lateral incision was opened, there was just about 2 mL of purulence sitting over the plate proximally.  There was some thin filmy tissue since the patient had vancomycin dripping.  No cultures were obtained, and we already have good cultures that are pending.  All seven screws were removed, plate was removed.  Bone was curetted, screw holes were curetted.  The fracture site was short oblique, there was no obvious motion, continued pressure was applied and inspection there appeared to be a trace motion, little bit of bone was debrided and small curettes were used to curette the fracture site partially,  it appeared that the lateral aspect was not healed, but the medial portion of the fibula had healed.  The wound was copiously irrigated.  All tissue including the film around where the plate had been located was removed with a Bair rongeur, a sharp excisional debridement.  Repeat irrigation copiously with a liter of saline, reapproximation of skin with 2-0 Vicryl.  The skin other then the 1-2 mL of purulent material, and slight film around the plate appeared normal.  The bone did not appear soft, did not appear to have the typical appearance of osteomyelitis.  Subcutaneous tissue was pulled together by hand  and then skin staples were used.  No subcutaneous sutures were placed to the patient's infection.  The wound closed easily with staples.  Xeroform after Marcaine infiltration 4x4s, Webril, and Ace wrap were applied soft dressing.  The patient tolerated the procedure well, transferred to the recovery room in stable condition.     Weiland Tomich C. Ophelia Charter, M.D.     MCY/MEDQ  D:  11/01/2012  T:  11/02/2012  Job:  [id number]

## 2012-11-02 NOTE — Progress Notes (Signed)
UR COMPLETED  

## 2012-11-03 ENCOUNTER — Encounter (HOSPITAL_COMMUNITY): Payer: Self-pay | Admitting: Infectious Diseases

## 2012-11-03 DIAGNOSIS — M869 Osteomyelitis, unspecified: Secondary | ICD-10-CM

## 2012-11-03 LAB — CK: Total CK: 34 U/L (ref 7–177)

## 2012-11-03 MED ORDER — HEPARIN SOD (PORK) LOCK FLUSH 100 UNIT/ML IV SOLN
250.0000 [IU] | Freq: Every day | INTRAVENOUS | Status: DC
  Filled 2012-11-03: qty 3

## 2012-11-03 MED ORDER — OXYCODONE-ACETAMINOPHEN 5-325 MG PO TABS: 1.0000 | ORAL_TABLET | ORAL | Status: DC | PRN

## 2012-11-03 MED ORDER — SODIUM CHLORIDE 0.9 % IV SOLN
300.0000 mg | INTRAVENOUS | Status: DC
  Administered 2012-11-03: 300 mg via INTRAVENOUS
  Filled 2012-11-03: qty 6

## 2012-11-03 MED ORDER — HEPARIN SOD (PORK) LOCK FLUSH 100 UNIT/ML IV SOLN
250.0000 [IU] | INTRAVENOUS | Status: DC | PRN
  Administered 2012-11-03: 250 [IU]
  Filled 2012-11-03: qty 3

## 2012-11-03 MED ORDER — SODIUM CHLORIDE 0.9 % IV SOLN: 300.0000 mg | INTRAVENOUS | Status: DC

## 2012-11-03 NOTE — Progress Notes (Signed)
Patient ID: Brianna Bowman, female   DOB: Feb 01, 1971, 41 y.o.   MRN: [id number]  Report Status: Complete   Specimen: [id number] Brianna Bowman, Brianna Bowman, Brianna Bowman   Requisition: [id number] DOB: [birth year] AGE: 49 Client ID: [id number]   Collected: 10/29/2012 4:49:00 PM Gender: F Phone: [phone] [facility]   Received: 10/29/2012 9:20:00 PM SSN: [id number], [address]   Reported: 10/30/2012 1:49:00 PM ID: [id number] [place] [address]     Test Name In Range Out of Range Reference Range  Culture, Wound     Culture, Wound Culture, Wound    Culture, Anaerobic     Culture, Anaerobic Culture, Anaerobic      Performing Laboratory Information:  SLN    Comments:  Performed at: [facility] [address], [address] [place], [place] [address]  Culture, Wound - ===== GRAM STAIN: ===== Moderate WBC present-predominately PMN No Squamous Epithelial Cells Seen Rare Gram Positive Cocci In Pairs In Clusters  FINAL REPORT: Moderate STAPHYLOCOCCUS AUREUS  Rifampin and Gentamicin should not be used as single drugs for treatment of Staph infections.  Sensitivity for: STAPHYLOCOCCUS AUREUS  PENICILLIN Resistant >=0.5  OXACILLIN Sensitive 0.5  CEFAZOLIN Sensitive  GENTAMICIN Sensitive <=0.5  CIPROFLOXACIN Sensitive <=0.5  LEVOFLOXACIN Sensitive 0.25  MOXIFLOXACIN Sensitive <=0.25  TRIMETH/SULFA Sensitive <=10  VANCOMYCIN Sensitive <=0.5  CLINDAMYCIN Sensitive <=0.25  ERYTHROMYCIN Sensitive <=0.25  LINEZOLID Sensitive 2  QUINUPRISTIN/DALF Sensitive <=0.25  RIFAMPIN Sensitive <=0.5  TETRACYCLINE Sensitive <=1   Culture, Wound - END OF REPORT Performed at: [facility] [address], [address] [place], [place] [address] PRELIM REPORT: No Anaerobes Isolated; Culture in Progress for  5 Days

## 2012-11-03 NOTE — Progress Notes (Signed)
Occupational Therapy Treatment Patient Details Name: Brianna Bowman MRN: [id number] DOB: [birth year] Today's Date: 11/03/2012 Time: 4696-2952 OT Time Calculation (min): 12 min  OT Assessment / Plan / Recommendation Comments on Treatment Session  Pt progressing towards goals. Pt planning to d/c today. Practiced toilet transfer and LB dressing. Spoke with pt to make sure she maintains WB status on LLE when cam boot is not on.     Follow Up Recommendations  No OT follow up;Supervision - Intermittent    Barriers to Discharge       Equipment Recommendations  None recommended by OT    Recommendations for Other Services    Frequency Min 2X/week   Plan Discharge plan remains appropriate    Precautions / Restrictions Precautions Precautions: Fall Required Braces or Orthoses: Other Brace/Splint (cam walker) Restrictions Weight Bearing Restrictions: Yes LLE Weight Bearing: Weight bearing as tolerated Other Position/Activity Restrictions: with cam boot   Pertinent Vitals/Pain No pain reported.     ADL  Lower Body Bathing: Simulated;Supervision/safety Where Assessed - Lower Body Bathing: Unsupported sit to stand Upper Body Dressing: Supervision/safety Where Assessed - Upper Body Dressing: Unsupported standing Lower Body Dressing: Supervision/safety Where Assessed - Lower Body Dressing: Unsupported sit to stand Toilet Transfer: Modified independent Toilet Transfer Method: Sit to stand Toilet Transfer Equipment: Regular height toilet Equipment Used: Other (comment) (crutch, cam walker) ADL Comments: Pt able to perform simulated regular height toilet transfer. Pt dressed self at supervision level for vc's to maintain WB status on LLE as she had taken cam boot off to get pants and clothes on.     OT Diagnosis:    OT Problem List:   OT Treatment Interventions:     OT Goals Acute Rehab OT Goals OT Goal Formulation: With patient Time For Goal Achievement: 11/09/12 Potential to Achieve  Goals: Good ADL Goals Pt Will Perform Lower Body Bathing: with modified independence;Sit to stand from chair ADL Goal: Lower Body Bathing - Progress: Progressing toward goals Pt Will Perform Lower Body Dressing: with modified independence;Sit to stand from bed;Sit to stand from chair ADL Goal: Lower Body Dressing - Progress: Progressing toward goals Pt Will Transfer to Toilet: with modified independence;Ambulation;Regular height toilet ADL Goal: Toilet Transfer - Progress: Met Pt Will Perform Toileting - Clothing Manipulation: with modified independence;Standing Pt Will Perform Toileting - Hygiene: with modified independence;Sit to stand from 3-in-1/toilet;Sitting on 3-in-1 or toilet  Visit Information  Last OT Received On: 11/03/12 Assistance Needed: +1    Subjective Data      Prior Functioning       Cognition  Cognition Arousal/Alertness: Awake/alert Behavior During Therapy: WFL for tasks assessed/performed Overall Cognitive Status: Within Functional Limits for tasks assessed    Mobility  Bed Mobility Bed Mobility: Not assessed Transfers Transfers: Sit to Stand;Stand to Sit Sit to Stand: 6: Modified independent (Device/Increase time);From bed;From toilet Stand to Sit: 6: Modified independent (Device/Increase time);To chair/3-in-1;To toilet Details for Transfer Assistance: Demonstrated good technique. Mod I for trasnfers.    Exercises      Balance     End of Session OT - End of Session Equipment Utilized During Treatment: Other (comment) (cam boot, crutch) Activity Tolerance: Patient tolerated treatment well Patient left: in chair;with call bell/phone within reach  GO     Earlie Raveling OTR/L [phone] 11/03/2012, 8:42 AM

## 2012-11-03 NOTE — Progress Notes (Signed)
Subjective: 2 Days Post-Op Procedure(s) (LRB): HARDWARE REMOVAL (Left) Patient reports pain as mild.  Better control of pain today.  Has been out of bed with PT and also downstairs to smoke. Has been weight bearing on the left leg with boot. For now will hold weight bearing as she tends to be non compliant with the boot.  The boot should be worn at all times when out of bed.  May be off at rest. Spoke with Dr Ninetta Lights regarding C&S of ankle specimen taken in office prior to admission and sent to [facility]. He will see pt this afternoon for decision on appropriate coverage for pt. Will plan on discharge after he sees the patient.  Objective: Vital signs in last 24 hours: Temp:  [97.7 F (36.5 C)-98.7 F (37.1 C)] 98.5 F (36.9 C) (05/14 0620) Pulse Rate:  [59-73] 71 (05/14 0620) Resp:  [18] 18 (05/14 0620) BP: (93-95)/(48-54) 95/54 mmHg (05/14 0620) SpO2:  [98 %-100 %] 98 % (05/14 0620)  Intake/Output from previous day: 05/13 0701 - 05/14 0700 In: 1580 [P.O.:680; I.V.:900] Out: -  Intake/Output this shift:     Recent Labs  11/01/12 1502  HGB 12.6    Recent Labs  11/01/12 1502  WBC 7.7  RBC 4.07  HCT 37.3  PLT 319    Recent Labs  11/01/12 1502  NA 138  K 3.9  CL 104  CO2 24  BUN 11  CREATININE 0.86  GLUCOSE 96  CALCIUM 10.0   No results found for this basename: LABPT, INR,  in the last 72 hours  cap refill intact of toes.  dressing intact.  Assessment/Plan: 2 Days Post-Op Procedure(s) (LRB): HARDWARE REMOVAL (Left) Up with therapy Continue ABX therapy due to Post-op infection ID consult later today for abx treatment plan. Culture with MSSA  Pt will remain non weight bearing and wear boot for now. Elevation at rest.  Ileah Falkenstein M 11/03/2012, 8:55 AM

## 2012-11-03 NOTE — Progress Notes (Signed)
Pt discharged to home accompanied by family. Discharge instructions and rx given and explained. Patient's PICC was hep-locked for discharge at home use. Pt wanted to look at surgery site to make sure everything looked good. Spoke with Maud Deed, PA and was instructed to changed dressing. Pt left unit in a stable condition via wheelchair.

## 2012-11-03 NOTE — Consult Note (Signed)
INFECTIOUS DISEASE CONSULT NOTE  Date of Admission:  11/01/2012  Date of Consult:  11/03/2012  Reason for Consult: Osteomyelitis L ankle Referring Physician: Ophelia Charter  Impression/Recommendation Osteomyelitis L ankle  Will start her on cubicin Check CK Check HIV See her back in ID clinic in 2-3 weeks.   Comment- pt with long standing wound and now removal of hardware. My assumption is that she has deeper infection and will treat her as osteo. Explained to pt. Will see her in ID clinic to f/u.   Thank you so much for this interesting consult,   Brianna Bowman [phone]  Brianna Bowman is an 42 y.o. female.  HPI: 42 yo F with hx of L ankle fracture  after falling in late October. She underwent ORIF April 28, 2012 . She states that her wound never healed completely, and then she developed d/c from her wound. She was seen in Dr Ophelia Charter office on May 9 and had purulent d/c expressed from her wound. This was sent for Cx and grew MSSA (R-PEN). She was placed on doxy. She was brought to hospital on 5-12 and underwent removal of hardware and I & D. She was started on vancomycin.   Past Medical History  Diagnosis Date  . Depression   . Anemia     with pregnancy  . PONV (postoperative nausea and vomiting)     with epidural anad 04/28/13 BP low and took longer to wake after surgery 04/28/2012  . Anxiety     due to condition  . Headache     history of    Past Surgical History  Procedure Laterality Date  . Fracture surgery      L femur  . Orif ankle fracture  04/28/2012    Procedure: OPEN REDUCTION INTERNAL FIXATION (ORIF) ANKLE FRACTURE;  Surgeon: Eldred Manges, MD;  Location: [facility] OR;  Service: Orthopedics;  Laterality: Left;  Open reduction internal fixation left ankle bimalleolar fracture  . Mandible fracture surgery    . Hardware removal Left 11/01/2012    Procedure: HARDWARE REMOVAL;  Surgeon: Eldred Manges, MD;  Location: [facility] OR;  Service: Orthopedics;  Laterality: Left;  Left Ankle Removal  Fibula Plate and 10 screws, Irrigation and Debridement     Allergies  Allergen Reactions  . Amoxicillin Anaphylaxis  . Adhesive (Tape) Hives  . Aloe Hives    Medications:  Scheduled: . enoxaparin (LOVENOX) injection  40 mg Subcutaneous Q24H  . mupirocin ointment   Nasal BID    Total days of antibiotics: 2 (vancomycin)          Social History:  reports that she has been smoking.  She does not have any smokeless tobacco history on file. She reports that  drinks alcohol. She reports that she uses illicit drugs (Marijuana).  Family History  Problem Relation Age of Onset  . Multiple sclerosis Father     General ROS: normal BM, normal urination, no proximal erythema. no f/c. see HPI.  Blood pressure 95/54, pulse 71, temperature 98.5 F (36.9 C), temperature source Oral, resp. rate 18, height 5\' 5"  (1.651 m), weight 49.102 kg (108 lb 4 oz), last menstrual period 10/20/2012, SpO2 98.00%. General appearance: alert, cooperative and no distress Eyes: negative findings: pupils equal, round, reactive to light and accomodation Throat: lips, mucosa, and tongue normal; teeth and gums normal Neck: no adenopathy and supple, symmetrical, trachea midline Lungs: clear to auscultation bilaterally Heart: regular rate and rhythm Abdomen: normal findings: bowel sounds normal and soft, non-tender Extremities:  edema none and RUE pic. LLE wrapped. normal light touch in toes.     Results for orders placed during the hospital encounter of 11/01/12 (from the past 48 hour(s))  C-REACTIVE PROTEIN     Status: Abnormal   Collection Time    11/01/12  3:02 PM      Result Value Range   CRP <0.5 (*) <0.60 mg/dL  SEDIMENTATION RATE     Status: None   Collection Time    11/01/12  3:02 PM      Result Value Range   Sed Rate 18  0 - 22 mm/hr  CBC     Status: None   Collection Time    11/01/12  3:02 PM      Result Value Range   WBC 7.7  4.0 - 10.5 K/uL   RBC 4.07  3.87 - 5.11 MIL/uL   Hemoglobin 12.6   12.0 - 15.0 g/dL   HCT 08.6  57.8 - 46.9 %   MCV 91.6  78.0 - 100.0 fL   MCH 31.0  26.0 - 34.0 pg   MCHC 33.8  30.0 - 36.0 g/dL   RDW 62.9  52.8 - 41.3 %   Platelets 319  150 - 400 K/uL  COMPREHENSIVE METABOLIC PANEL     Status: Abnormal   Collection Time    11/01/12  3:02 PM      Result Value Range   Sodium 138  135 - 145 mEq/L   Potassium 3.9  3.5 - 5.1 mEq/L   Chloride 104  96 - 112 mEq/L   CO2 24  19 - 32 mEq/L   Glucose, Bld 96  70 - 99 mg/dL   BUN 11  6 - 23 mg/dL   Creatinine, Ser 2.44  0.50 - 1.10 mg/dL   Calcium 01.0  8.4 - 27.2 mg/dL   Total Protein 6.8  6.0 - 8.3 g/dL   Albumin 3.6  3.5 - 5.2 g/dL   AST 11  0 - 37 U/L   ALT 9  0 - 35 U/L   Alkaline Phosphatase 60  39 - 117 U/L   Total Bilirubin 0.2 (*) 0.3 - 1.2 mg/dL   GFR calc non Af Amer 83 (*) >90 mL/min   GFR calc Af Amer >90  >90 mL/min   Comment:            The eGFR has been calculated     using the CKD EPI equation.     This calculation has not been     validated in all clinical     situations.     eGFR's persistently     <90 mL/min signify     possible Chronic Kidney Disease.  HCG, SERUM, QUALITATIVE     Status: None   Collection Time    11/01/12  3:02 PM      Result Value Range   Preg, Serum NEGATIVE  NEGATIVE   Comment:            THE SENSITIVITY OF THIS     METHODOLOGY IS >10 mIU/mL.  SURGICAL PCR SCREEN     Status: Abnormal   Collection Time    11/01/12  3:05 PM      Result Value Range   MRSA, PCR NEGATIVE  NEGATIVE   Staphylococcus aureus POSITIVE (*) NEGATIVE   Comment:            The Xpert SA Assay (FDA     approved for NASAL specimens  in patients over 80 years of age),     is one component of     a comprehensive surveillance     program.  Test performance has     been validated by [employer] for patients greater     than or equal to 80 year old.     It is not intended     to diagnose infection nor to     guide or monitor treatment.   No results found for this  basename: sdes,  specrequest,  cult,  reptstatus   No results found. Recent Results (from the past 240 hour(s))  SURGICAL PCR SCREEN     Status: Abnormal   Collection Time    11/01/12  3:05 PM      Result Value Range Status   MRSA, PCR NEGATIVE  NEGATIVE Final   Staphylococcus aureus POSITIVE (*) NEGATIVE Final   Comment:            The Xpert SA Assay (FDA     approved for NASAL specimens     in patients over 64 years of age),     is one component of     a comprehensive surveillance     program.  Test performance has     been validated by [employer] for patients greater     than or equal to 62 year old.     It is not intended     to diagnose infection nor to     guide or monitor treatment.      11/03/2012, 11:36 AM     LOS: 2 days

## 2012-11-03 NOTE — Care Management Note (Signed)
CARE MANAGEMENT NOTE 11/03/2012  Patient:  Brianna Bowman, Brianna Bowman   Account Number:  [id number]  Date Initiated:  11/03/2012  Documentation initiated by:  Vance Peper  Subjective/Objective Assessment:   42 yr old female s/p Hardware removal with I & D of left ankle ORIF (done 04/2012)     Action/Plan:   CM spoke with patient concerning need for [facility] for IV antibiotics, PICC line care. Patient to have PICC placed today.   Anticipated DC Date:  11/03/2012   Anticipated DC Plan:  HOME W HOME HEALTH SERVICES      DC Planning Services  CM consult      [facility] Choice  HOME HEALTH   Choice offered to / List presented to:  C-1 Patient        HH arranged  HH-1 RN  IV Antibiotics      HH agency  [facility]   Status of service:  Completed, signed off Medicare Important Message given?   (If response is "NO", the following Medicare IM given date fields will be blank) Date Medicare IM given:   Date Additional Medicare IM given:    Discharge Disposition:  HOME W HOME HEALTH SERVICES  Per UR Regulation:    If discussed at Long Length of Stay Meetings, dates discussed:    Comments:  11/03/12 1:15pm Vance Peper, RN BSN NCM Patient has informed [facility] liasion that she may not want PICC or IV antibiotics, wants to discuss with MD further.Home Health has been arranged, should she decide to accept care.

## 2012-11-03 NOTE — Progress Notes (Signed)
Physical Therapy Treatment Patient Details Name: Brianna Bowman MRN: [id number] DOB: Jun 08, 1971 Today's Date: 11/03/2012 Time: 6295-2841 PT Time Calculation (min): 8 min  PT Assessment / Plan / Recommendation Comments on Treatment Session  Pt moving well with CAM boot. Can be impulsive at times and prefers to amb with hop to gt due to increased pain with WBAT in ankle. Will cont to f/u with pt while in acute setting to increase mobility and ensure safety.    Follow Up Recommendations  No PT follow up     Does the patient have the potential to tolerate intense rehabilitation     Barriers to Discharge        Equipment Recommendations  Rolling walker with 5" wheels    Recommendations for Other Services    Frequency Min 5X/week   Plan Discharge plan remains appropriate;Frequency remains appropriate    Precautions / Restrictions Precautions Precautions: Fall Required Braces or Orthoses: Other Brace/Splint (cam walker) Restrictions Weight Bearing Restrictions: Yes LLE Weight Bearing: Weight bearing as tolerated Other Position/Activity Restrictions: with cam boot   Pertinent Vitals/Pain 9/10 with WB on R ankle; states CAM boot "Squeezes the staples"; pt premedicated.     Mobility  Bed Mobility Bed Mobility: Not assessed Transfers Transfers: Sit to Stand;Stand to Sit Sit to Stand: 6: Modified independent (Device/Increase time);From bed;From toilet Stand to Sit: 6: Modified independent (Device/Increase time);To chair/3-in-1;To toilet Details for Transfer Assistance: Demonstrated good technique. Mod I for trasnfers. Ambulation/Gait Ambulation/Gait Assistance: 5: Supervision Ambulation Distance (Feet): 100 Feet Assistive device: None;Crutches Ambulation/Gait Assistance Details: pt initially amb with single crutch; stated she felt fine to amb without an AD; pt demo good stability and balance with hopping and amb with CAM walker. pt c/o pain in L ankle with CAM boot on. Vc's for  safety  Gait Pattern: Step-to pattern (and hop to; prefers hop to due to pain ) Gait velocity: decreased Stairs: No (denies need to attempt stairs) Wheelchair Mobility Wheelchair Mobility: No    Exercises     PT Diagnosis:    PT Problem List:   PT Treatment Interventions:     PT Goals Acute Rehab PT Goals PT Goal Formulation: With patient Time For Goal Achievement: 11/09/12 Potential to Achieve Goals: Good PT Goal: Sit to Stand - Progress: Progressing toward goal PT Goal: Stand to Sit - Progress: Progressing toward goal PT Goal: Ambulate - Progress: Progressing toward goal  Visit Information  Last PT Received On: 11/03/12 Assistance Needed: +1 PT/OT Co-Evaluation/Treatment: Yes    Subjective Data  Subjective: "When i get home, im probably just going to hop. The doctor never told me I could walk on this foot"  Patient Stated Goal: home   Cognition  Cognition Arousal/Alertness: Awake/alert Behavior During Therapy: WFL for tasks assessed/performed Overall Cognitive Status: Within Functional Limits for tasks assessed    Balance  Balance Balance Assessed: Yes Dynamic Standing Balance Dynamic Standing - Balance Support: No upper extremity supported;During functional activity Dynamic Standing - Level of Assistance: 5: Stand by assistance  End of Session PT - End of Session Equipment Utilized During Treatment: Gait belt;Other (comment) (CAM boot) Activity Tolerance: Patient tolerated treatment well Patient left: in bed;with call bell/phone within reach;Other (comment) (OT present ) Nurse Communication: Mobility status   GP     Donell Sievert, [place] [phone] 11/03/2012, 9:40 AM

## 2012-11-04 LAB — HIV ANTIBODY (ROUTINE TESTING W REFLEX): HIV: NONREACTIVE

## 2012-11-25 ENCOUNTER — Encounter: Payer: Self-pay | Admitting: Infectious Diseases

## 2012-11-26 ENCOUNTER — Encounter: Payer: Self-pay | Admitting: Infectious Diseases

## 2012-11-29 ENCOUNTER — Ambulatory Visit (INDEPENDENT_AMBULATORY_CARE_PROVIDER_SITE_OTHER): Payer: Medicaid Other | Admitting: Infectious Diseases

## 2012-11-29 ENCOUNTER — Encounter: Payer: Self-pay | Admitting: Infectious Diseases

## 2012-11-29 VITALS — BP 115/76 | HR 92 | Temp 98.2°F | Ht 65.0 in | Wt 105.0 lb

## 2012-11-29 DIAGNOSIS — M869 Osteomyelitis, unspecified: Secondary | ICD-10-CM

## 2012-11-29 DIAGNOSIS — Z720 Tobacco use: Secondary | ICD-10-CM | POA: Insufficient documentation

## 2012-11-29 DIAGNOSIS — F17200 Nicotine dependence, unspecified, uncomplicated: Secondary | ICD-10-CM

## 2012-11-29 NOTE — Progress Notes (Signed)
  Subjective:    Patient ID: Brianna Bowman, female    DOB: 02-15-1971, 42 y.o.   MRN: [id number]  HPI 42 yo F with hx of L ankle fracture after falling in late October. She underwent ORIF April 28, 2012 . She states that her wound never healed completely, and then she developed d/c from her wound. She was seen in Dr Ophelia Charter office on May 9 and had purulent d/c expressed from her wound. This was sent for Cx and grew MSSA (R-PEN). She was placed on doxy. She was brought to hospital on 5-12 and underwent removal of hardware and I & D. She was started on vancomycin. She was d/c from hospital on 5-14 on cubicin  No problems with PIC. Had tape allergy. She is not supposed to bear wt but has been. Wearing a boot. Wound has healed. No f/c.   Today is day 29 of anbx  Review of Systems  Constitutional: Negative for fever, chills, appetite change and unexpected weight change.  Gastrointestinal: Negative for diarrhea.  Genitourinary: Negative for difficulty urinating.  Skin: Negative for rash.       Objective:   Physical Exam  Constitutional: She appears well-developed and well-nourished.  HENT:  Mouth/Throat: No oropharyngeal exudate.  Eyes: EOM are normal. Pupils are equal, round, and reactive to light.  Neck: Neck supple.  Cardiovascular: Normal rate, regular rhythm and normal heart sounds.   Pulmonary/Chest: Effort normal and breath sounds normal.  Abdominal: Soft. Bowel sounds are normal. There is no tenderness. There is no rebound.  Musculoskeletal:       Arms:      Feet:  Lymphadenopathy:    She has no cervical adenopathy.          Assessment & Plan:

## 2012-11-29 NOTE — Assessment & Plan Note (Signed)
She is doing well. Will plan on her completing her anbx at the end of 42 days (12-12-12). Will obtain her labs from home health company (especially CK). Will have home health pull her PIC at end of rx as well. rtc prn

## 2012-11-29 NOTE — Assessment & Plan Note (Signed)
Encouraged her to stop smoking.  

## 2012-12-02 NOTE — Discharge Summary (Signed)
Physician Discharge Summary  Patient ID: Brianna Bowman MRN: [id number] DOB/AGE: [birth year] 42 y.o.  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 Depression   . Anemia     with pregnancy  . PONV (postoperative nausea and vomiting)     with epidural anad 04/28/13 BP low and took longer to wake after surgery 04/28/2012  . Anxiety     due to condition  . Headache(784.0)     history of    Surgeries: Procedure(s): HARDWARE REMOVAL left ankle on 11/01/2012   Consultants (if any):  Dr Ninetta Lights of Infectious Disease   Discharged Condition: Improved  Hospital Course: Brianna Bowman is an 42 y.o. female who was admitted 11/01/2012 with a diagnosis of <principal problem not specified> and went to the operating room on 11/01/2012 and underwent the above named procedures.    She was given perioperative antibiotics:  Anti-infectives   Start     Dose/Rate Route Frequency Ordered Stop   11/03/12 1300  DAPTOmycin (CUBICIN) 300 mg in sodium chloride 0.9 % IVPB  Status:  Discontinued     300 mg 212 mL/hr over 30 Minutes Intravenous Every 24 hours 11/03/12 1137 11/03/12 1924   11/03/12 0000  sodium chloride 0.9 % SOLN 100 mL with DAPTOmycin 500 MG SOLR 300 mg     300 mg 212 mL/hr over 30 Minutes Intravenous Every 24 hours 11/03/12 1145     11/02/12 0600  vancomycin (VANCOCIN) IVPB 1000 mg/200 mL premix     1,000 mg 200 mL/hr over 60 Minutes Intravenous Every 12 hours 11/01/12 2056 11/02/12 0700   11/01/12 1450  vancomycin (VANCOCIN) 1 GM/200ML IVPB    Comments:  SAVAGE, MICHELLE: cabinet override      11/01/12 1450 11/02/12 0259   11/01/12 1445  vancomycin (VANCOCIN) IVPB 1000 mg/200 mL premix     1,000 mg 200 mL/hr over 60 Minutes Intravenous  Once 11/01/12  1444 11/01/12 1856    .  She was given sequential compression devices, early ambulation, and aspirin for DVT prophylaxis. PICC line placed for extended use of abx.  Per Dr Ninetta Lights pt placed on Cubicin and to follow in ID clinic  She benefited maximally from the hospital stay and there were no complications.    Recent vital signs:  Filed Vitals:   11/03/12 0620  BP: 95/54  Pulse: 71  Temp: 98.5 F (36.9 C)  Resp: 18    Recent laboratory studies:  Lab Results  Component Value Date   HGB 12.6 11/01/2012   HGB 12.2 04/28/2012   HGB 10.1* 08/22/2009   Lab Results  Component Value Date   WBC 7.7 11/01/2012   PLT 319 11/01/2012   No results found for this basename: INR   Lab Results  Component Value Date   NA 138 11/01/2012   K 3.9 11/01/2012   CL 104 11/01/2012   CO2 24 11/01/2012   BUN 11 11/01/2012   CREATININE 0.86 11/01/2012   GLUCOSE 96 11/01/2012    Discharge Medications:     Medication List    STOP taking these medications       doxycycline 100 MG capsule  Commonly known as:  VIBRAMYCIN      TAKE these medications       BC HEADACHE POWDER PO  Take 1 Package by mouth daily as needed (for pain).     diphenhydrAMINE 25 MG tablet  Commonly known as:  BENADRYL  Take 25 mg by mouth at bedtime as needed for itching.     oxyCODONE-acetaminophen 5-325 MG per tablet  Commonly known as:  PERCOCET/ROXICET  Take 1-2 tablets by mouth every 4 (four) hours as needed.     sodium chloride 0.9 % SOLN 100 mL with DAPTOmycin 500 MG SOLR 300 mg  Inject 300 mg into the vein daily.        Diagnostic Studies: No results found.  Disposition: 01-Home or Self Care      Discharge Orders   Future Orders Complete By Expires     Call MD / Call 911  As directed     Comments:      If you experience chest pain or shortness of breath, CALL 911 and be transported to the hospital emergency room.  If you develope a fever above 101 F, pus (white drainage) or increased drainage or  redness at the wound, or calf pain, call your surgeon's office.    Constipation Prevention  As directed     Comments:      Drink plenty of fluids.  Prune juice may be helpful.  You may use a stool softener, such as Colace (over the counter) 100 mg twice a day.  Use MiraLax (over the counter) for constipation as needed.    Diet - low sodium heart healthy  As directed     Discharge instructions  As directed     Comments:      Keep short leg splint and dressing dry. May use water impervious bag or cast bag and tub chair to shower Tape the top of bag to skin to avoid moisture soaking the dressing on the leg. Call if there is odor or saturation of dressing or worsening pain not controlled with medications. Call if fever greater than 101.5. Use crutches or walker no weight bearing on the ankle fracture leg. Please follow up with an appointment with Dr.Yates  2 weeks from the time of surgery. Elevate as often as possible during the first week after surgery gradually increasing the time the leg is dependent or down there after. If swelling recurrs then elevate again. Wear boot at all times when out of bed.  Non to touch down weight bearing only to foot and use crutches when ambulating    Increase activity slowly as tolerated  As directed        Follow-up Information   Follow up with YATES,MARK C, MD. Schedule an appointment as soon as possible for a visit in 2 weeks.   Contact information:   [address] [place] [address] [phone]        Signed: Wende Neighbors 12/02/2012, 3:48 PM

## 2012-12-07 ENCOUNTER — Telehealth: Payer: Self-pay | Admitting: *Deleted

## 2012-12-07 NOTE — Telephone Encounter (Signed)
Called Raynelle Fanning back as she left a message wanting to know the end date on the patient PICC. Advised her per Dr Ninetta Lights office note from 11/29/12 the patient is correct and her PICC can be D/C and pulled 12/12/12 at the end of her IV therapy. She advised she will inform the pharmacy and they will send an order to sign later.

## 2012-12-08 ENCOUNTER — Encounter: Payer: Self-pay | Admitting: Infectious Diseases

## 2012-12-31 ENCOUNTER — Encounter: Payer: Self-pay | Admitting: Infectious Diseases

## 2013-01-25 ENCOUNTER — Encounter: Payer: Self-pay | Admitting: Infectious Diseases

## 2013-04-28 ENCOUNTER — Other Ambulatory Visit: Payer: Self-pay

## 2015-12-24 ENCOUNTER — Other Ambulatory Visit: Payer: Self-pay | Admitting: Medical

## 2015-12-24 DIAGNOSIS — R921 Mammographic calcification found on diagnostic imaging of breast: Secondary | ICD-10-CM

## 2015-12-31 ENCOUNTER — Other Ambulatory Visit: Payer: Self-pay | Admitting: Medical

## 2015-12-31 ENCOUNTER — Ambulatory Visit
Admission: RE | Admit: 2015-12-31 | Discharge: 2015-12-31 | Disposition: A | Discharge status: Home / Self Care | Payer: Medicaid Other | Source: Ambulatory Visit | Attending: Medical | Admitting: Medical

## 2015-12-31 DIAGNOSIS — R921 Mammographic calcification found on diagnostic imaging of breast: Secondary | ICD-10-CM

## 2017-06-12 DIAGNOSIS — M792 Neuralgia and neuritis, unspecified: Secondary | ICD-10-CM | POA: Diagnosis present

## 2018-02-08 ENCOUNTER — Other Ambulatory Visit: Payer: Self-pay

## 2018-02-08 ENCOUNTER — Encounter (HOSPITAL_BASED_OUTPATIENT_CLINIC_OR_DEPARTMENT_OTHER): Payer: Self-pay | Admitting: Respiratory Therapy

## 2018-02-08 ENCOUNTER — Observation Stay (HOSPITAL_BASED_OUTPATIENT_CLINIC_OR_DEPARTMENT_OTHER)
Admission: EM | Admit: 2018-02-08 | Discharge: 2018-02-10 | Disposition: A | Discharge status: Home / Self Care | Payer: Self-pay | Attending: Internal Medicine | Admitting: Internal Medicine

## 2018-02-08 ENCOUNTER — Emergency Department (HOSPITAL_BASED_OUTPATIENT_CLINIC_OR_DEPARTMENT_OTHER): Payer: Medicaid Other

## 2018-02-08 DIAGNOSIS — F32A Depression, unspecified: Secondary | ICD-10-CM

## 2018-02-08 DIAGNOSIS — N644 Mastodynia: Secondary | ICD-10-CM | POA: Insufficient documentation

## 2018-02-08 DIAGNOSIS — Z7982 Long term (current) use of aspirin: Secondary | ICD-10-CM | POA: Insufficient documentation

## 2018-02-08 DIAGNOSIS — E785 Hyperlipidemia, unspecified: Secondary | ICD-10-CM | POA: Insufficient documentation

## 2018-02-08 DIAGNOSIS — Z79899 Other long term (current) drug therapy: Secondary | ICD-10-CM | POA: Insufficient documentation

## 2018-02-08 DIAGNOSIS — J439 Emphysema, unspecified: Secondary | ICD-10-CM | POA: Insufficient documentation

## 2018-02-08 DIAGNOSIS — G43909 Migraine, unspecified, not intractable, without status migrainosus: Secondary | ICD-10-CM | POA: Insufficient documentation

## 2018-02-08 DIAGNOSIS — F411 Generalized anxiety disorder: Secondary | ICD-10-CM

## 2018-02-08 DIAGNOSIS — F17210 Nicotine dependence, cigarettes, uncomplicated: Secondary | ICD-10-CM | POA: Insufficient documentation

## 2018-02-08 DIAGNOSIS — I2510 Atherosclerotic heart disease of native coronary artery without angina pectoris: Secondary | ICD-10-CM | POA: Insufficient documentation

## 2018-02-08 DIAGNOSIS — Z720 Tobacco use: Secondary | ICD-10-CM

## 2018-02-08 DIAGNOSIS — I700 Atherosclerosis of aorta: Secondary | ICD-10-CM | POA: Insufficient documentation

## 2018-02-08 DIAGNOSIS — Z8669 Personal history of other diseases of the nervous system and sense organs: Secondary | ICD-10-CM

## 2018-02-08 DIAGNOSIS — Z9889 Other specified postprocedural states: Secondary | ICD-10-CM | POA: Insufficient documentation

## 2018-02-08 DIAGNOSIS — Z888 Allergy status to other drugs, medicaments and biological substances status: Secondary | ICD-10-CM | POA: Insufficient documentation

## 2018-02-08 DIAGNOSIS — R079 Chest pain, unspecified: Principal | ICD-10-CM | POA: Insufficient documentation

## 2018-02-08 DIAGNOSIS — F329 Major depressive disorder, single episode, unspecified: Secondary | ICD-10-CM | POA: Insufficient documentation

## 2018-02-08 DIAGNOSIS — I252 Old myocardial infarction: Secondary | ICD-10-CM | POA: Insufficient documentation

## 2018-02-08 DIAGNOSIS — Z880 Allergy status to penicillin: Secondary | ICD-10-CM | POA: Insufficient documentation

## 2018-02-08 DIAGNOSIS — F419 Anxiety disorder, unspecified: Secondary | ICD-10-CM

## 2018-02-08 DIAGNOSIS — M792 Neuralgia and neuritis, unspecified: Secondary | ICD-10-CM | POA: Insufficient documentation

## 2018-02-08 LAB — CBC
HCT: 39.8 % (ref 36.0–46.0)
HEMOGLOBIN: 13.6 g/dL (ref 12.0–15.0)
MCH: 32.5 pg (ref 26.0–34.0)
MCHC: 34.2 g/dL (ref 30.0–36.0)
MCV: 95 fL (ref 78.0–100.0)
PLATELETS: 245 10*3/uL (ref 150–400)
RBC: 4.19 MIL/uL (ref 3.87–5.11)
RDW: 13.6 % (ref 11.5–15.5)
WBC: 7.1 10*3/uL (ref 4.0–10.5)

## 2018-02-08 LAB — PREGNANCY, URINE: PREG TEST UR: NEGATIVE

## 2018-02-08 LAB — BASIC METABOLIC PANEL
ANION GAP: 6 (ref 5–15)
BUN: 14 mg/dL (ref 6–20)
CALCIUM: 9 mg/dL (ref 8.9–10.3)
CO2: 25 mmol/L (ref 22–32)
CREATININE: 0.89 mg/dL (ref 0.44–1.00)
Chloride: 106 mmol/L (ref 98–111)
GLUCOSE: 89 mg/dL (ref 70–99)
Potassium: 3.8 mmol/L (ref 3.5–5.1)
Sodium: 137 mmol/L (ref 135–145)

## 2018-02-08 LAB — TROPONIN I

## 2018-02-08 NOTE — ED Triage Notes (Signed)
C/o CP x today-some CP last Thursday--CP better after a bc powder-no medical treatment-NAD-steady gait

## 2018-02-09 ENCOUNTER — Encounter (HOSPITAL_BASED_OUTPATIENT_CLINIC_OR_DEPARTMENT_OTHER): Payer: Self-pay | Admitting: Emergency Medicine

## 2018-02-09 ENCOUNTER — Emergency Department (HOSPITAL_BASED_OUTPATIENT_CLINIC_OR_DEPARTMENT_OTHER): Payer: Medicaid Other

## 2018-02-09 ENCOUNTER — Other Ambulatory Visit: Payer: Self-pay

## 2018-02-09 DIAGNOSIS — J439 Emphysema, unspecified: Secondary | ICD-10-CM

## 2018-02-09 DIAGNOSIS — F419 Anxiety disorder, unspecified: Secondary | ICD-10-CM

## 2018-02-09 DIAGNOSIS — F329 Major depressive disorder, single episode, unspecified: Secondary | ICD-10-CM

## 2018-02-09 DIAGNOSIS — F32A Depression, unspecified: Secondary | ICD-10-CM

## 2018-02-09 DIAGNOSIS — R079 Chest pain, unspecified: Secondary | ICD-10-CM | POA: Diagnosis present

## 2018-02-09 DIAGNOSIS — F411 Generalized anxiety disorder: Secondary | ICD-10-CM

## 2018-02-09 DIAGNOSIS — Z8669 Personal history of other diseases of the nervous system and sense organs: Secondary | ICD-10-CM

## 2018-02-09 LAB — RAPID URINE DRUG SCREEN, HOSP PERFORMED
Amphetamines: NOT DETECTED
Barbiturates: NOT DETECTED
Benzodiazepines: NOT DETECTED
COCAINE: NOT DETECTED
Opiates: NOT DETECTED
TETRAHYDROCANNABINOL: NOT DETECTED

## 2018-02-09 LAB — TSH: TSH: 1.034 u[IU]/mL (ref 0.350–4.500)

## 2018-02-09 LAB — D-DIMER, QUANTITATIVE: D-Dimer, Quant: 0.66 ug/mL-FEU — ABNORMAL HIGH (ref 0.00–0.50)

## 2018-02-09 LAB — TROPONIN I: Troponin I: <0.03 ng/mL (ref <0.03)

## 2018-02-09 MED ORDER — ALBUTEROL SULFATE (2.5 MG/3ML) 0.083% IN NEBU: 2.5000 mg | INHALATION_SOLUTION | RESPIRATORY_TRACT | Status: DC | PRN

## 2018-02-09 MED ORDER — ASPIRIN 81 MG PO CHEW
324.0000 mg | CHEWABLE_TABLET | Freq: Once | ORAL | Status: AC
  Administered 2018-02-09: 324 mg via ORAL
  Filled 2018-02-09: qty 4

## 2018-02-09 MED ORDER — METOPROLOL TARTRATE 50 MG PO TABS
50.0000 mg | ORAL_TABLET | Freq: Once | ORAL | Status: AC
  Administered 2018-02-10: 50 mg via ORAL
  Filled 2018-02-09: qty 1

## 2018-02-09 MED ORDER — SODIUM CHLORIDE 0.9 % IV SOLN
INTRAVENOUS | Status: DC
  Administered 2018-02-09: 05:00:00 via INTRAVENOUS

## 2018-02-09 MED ORDER — NICOTINE 21 MG/24HR TD PT24
21.0000 mg | MEDICATED_PATCH | Freq: Every day | TRANSDERMAL | Status: DC
  Administered 2018-02-09 – 2018-02-10 (×2): 21 mg via TRANSDERMAL
  Filled 2018-02-09 (×2): qty 1

## 2018-02-09 MED ORDER — ONDANSETRON HCL 4 MG/2ML IJ SOLN: 4.0000 mg | Freq: Four times a day (QID) | INTRAMUSCULAR | Status: DC | PRN

## 2018-02-09 MED ORDER — GI COCKTAIL ~~LOC~~: 30.0000 mL | Freq: Four times a day (QID) | ORAL | Status: DC | PRN

## 2018-02-09 MED ORDER — HEPARIN SODIUM (PORCINE) 5000 UNIT/ML IJ SOLN: 5000.0000 [IU] | Freq: Three times a day (TID) | INTRAMUSCULAR | Status: DC

## 2018-02-09 MED ORDER — IOPAMIDOL (ISOVUE-370) INJECTION 76%
100.0000 mL | Freq: Once | INTRAVENOUS | Status: AC | PRN
  Administered 2018-02-09: 100 mL via INTRAVENOUS

## 2018-02-09 MED ORDER — MORPHINE SULFATE (PF) 2 MG/ML IV SOLN: 1.0000 mg | INTRAVENOUS | Status: DC | PRN

## 2018-02-09 MED ORDER — NITROGLYCERIN 0.4 MG SL SUBL
0.4000 mg | SUBLINGUAL_TABLET | SUBLINGUAL | Status: DC | PRN
  Filled 2018-02-09: qty 1

## 2018-02-09 MED ORDER — ASPIRIN EC 325 MG PO TBEC: 325.0000 mg | DELAYED_RELEASE_TABLET | Freq: Every day | ORAL | Status: DC

## 2018-02-09 MED ORDER — NICOTINE 21 MG/24HR TD PT24
21.0000 mg | MEDICATED_PATCH | Freq: Every day | TRANSDERMAL | Status: DC
  Administered 2018-02-09: 21 mg via TRANSDERMAL
  Filled 2018-02-09: qty 1

## 2018-02-09 MED ORDER — SODIUM CHLORIDE 0.9 % IV SOLN: INTRAVENOUS | Status: DC

## 2018-02-09 MED ORDER — SODIUM CHLORIDE 0.9 % IV SOLN
INTRAVENOUS | Status: DC
  Administered 2018-02-09: 23:00:00 via INTRAVENOUS

## 2018-02-09 MED ORDER — ACETAMINOPHEN 325 MG PO TABS: 650.0000 mg | ORAL_TABLET | ORAL | Status: DC | PRN

## 2018-02-09 MED ORDER — ALPRAZOLAM 0.25 MG PO TABS
0.2500 mg | ORAL_TABLET | Freq: Two times a day (BID) | ORAL | Status: DC | PRN
  Administered 2018-02-09 (×2): 0.25 mg via ORAL
  Filled 2018-02-09 (×2): qty 1

## 2018-02-09 MED ORDER — MORPHINE SULFATE (PF) 2 MG/ML IV SOLN: 2.0000 mg | INTRAVENOUS | Status: DC | PRN

## 2018-02-09 MED ORDER — ASPIRIN 81 MG PO CHEW
324.0000 mg | CHEWABLE_TABLET | Freq: Every day | ORAL | Status: DC
Start: 2018-02-10 — End: 2018-02-09

## 2018-02-09 NOTE — ED Notes (Signed)
Pt lying on side with lights dimmed. Pt states that she has no pain r./t chest pain but does state that tpe pulling t IV site. This rn assessed iv site and notes that iv site clean, dry, intact as is dressing.

## 2018-02-09 NOTE — H&P (Signed)
History and Physical    MENUCHA DICESARE ZOX:[id number] DOB: [birth year] DOA: 02/08/2018   PCP: Patient, No Pcp Per   Patient coming from:  Home    Chief Complaint: Chest Pain  HPI: IYSIS Bowman is a 47 y.o. female with medical history significant for anxiety, depression, tobacco abuse, presenting to the emergency department at [facility] with 2 episodes of chest pain with being 5 days.  The patient, in review had the first episode 5 days ago, substernal, radiating to the jaw, to the back, and to the left shoulder, accompanied by diaphoresis, nausea and some shortness of breath.  It lasted for about 10 minutes, and it occurred in the evening, after dinner.  She took [facility] powder, and the symptoms eventually resolved..  A similar episode occurred last night similar in nature and in duration to the prior.  Once she arrived to the ED, the symptoms had resolved.  She did receive aspirin and nitroglycerin, but she is not sure if this helps or not, as the symptoms were not present during that admission.  She denies any syncope or presyncope.  The patient denies any numbness, or tingling.  She denies any headaches, or vision changes.  She denies any abdominal pain.  She denies any lower extremity swelling or calf pain.  The patient denies any history of PE or DVT.  She denies any recent long distance trip.  She denies any history of cancer.  She smokes about 1 pack a day of cigarettes.  She denies any recreational drug use.  She denies any significant amount of alcohol intake.  The patient denies any history of GERD.  She has a history of low blood pressure, therefore not taking any beta-blockers.  She denies any recent surgeries.  No recent infections.  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  She never had a cardiac evaluation, cardiac catheterization, 2D echo or EPS studies.  She does have family medical history significant for early MI in maternal grandparents.  She  denies taking any new herbal supplements, or diet products.   ED Course:  BP (!) 90/58   Pulse 71   Temp 98.2 F (36.8 C) (Oral)   Resp 16   Ht 5\' 4"  (1.626 m)   Wt 58.9 kg   LMP 01/16/2018   SpO2 95%   BMI 22.29 kg/m   [facility], her troponins were negative.  D-dimer was 0.66, with negative CT angio for PE.  She she does have moderate emphysema. EKG shows sinus rhythm, QTC normal at 467, and nonspecific T wave changes. White count 10.1.  UA negative.  Renal functions normal.  Review of Systems:  As per HPI otherwise all other systems reviewed and are negative  Past Medical History:  Diagnosis Date  . Anemia    with pregnancy  . Anxiety    due to condition  . Depression   . Headache(784.0)    history of  . PONV (postoperative nausea and vomiting)    with epidural anad 04/28/13 BP low and took longer to wake after surgery 04/28/2012    Past Surgical History:  Procedure Laterality Date  . FRACTURE SURGERY     L femur  . HARDWARE REMOVAL Left 11/01/2012   Procedure: HARDWARE REMOVAL;  Surgeon: Eldred Manges, MD;  Location: [facility] OR;  Service: Orthopedics;  Laterality: Left;  Left Ankle Removal Fibula Plate and 10 screws, Irrigation and Debridement  .  MANDIBLE FRACTURE SURGERY    . ORIF ANKLE FRACTURE  04/28/2012   Procedure: OPEN REDUCTION INTERNAL FIXATION (ORIF) ANKLE FRACTURE;  Surgeon: Eldred MangesMark C Yates, MD;  Location: [facility] OR;  Service: Orthopedics;  Laterality: Left;  Open reduction internal fixation left ankle bimalleolar fracture    Social History Social History   Socioeconomic History  . Marital status: Legally Separated    Spouse name: Not on file  . Number of children: Not on file  . Years of education: Not on file  . Highest education level: Not on file  Occupational History  . Not on file  Social Needs  . Financial resource strain: Not on file  . Food insecurity:    Worry: Not on file    Inability: Not on file  . Transportation  needs:    Medical: Not on file    Non-medical: Not on file  Tobacco Use  . Smoking status: Current Every Day Smoker    Packs/day: 1.00    Years: 20.00    Pack years: 20.00  . Smokeless tobacco: Never Used  Substance and Sexual Activity  . Alcohol use: Yes    Comment: occ  . Drug use: Never  . Sexual activity: Not on file  Lifestyle  . Physical activity:    Days per week: Not on file    Minutes per session: Not on file  . Stress: Not on file  Relationships  . Social connections:    Talks on phone: Not on file    Gets together: Not on file    Attends religious service: Not on file    Active member of club or organization: Not on file    Attends meetings of clubs or organizations: Not on file    Relationship status: Not on file  . Intimate partner violence:    Fear of current or ex partner: Not on file    Emotionally abused: Not on file    Physically abused: Not on file    Forced sexual activity: Not on file  Other Topics Concern  . Not on file  Social History Narrative  . Not on file     Allergies  Allergen Reactions  . Amoxicillin Anaphylaxis  . Adhesive [Tape] Hives  . Aloe Hives    Family History  Problem Relation Age of Onset  . Multiple sclerosis Father        Prior to Admission medications   Medication Sig Start Date End Date Taking? Authorizing Provider  Aspirin-Salicylamide-Caffeine (BC HEADACHE POWDER PO) Take 1 Package by mouth daily as needed (for pain).     [provider]  diphenhydrAMINE (BENADRYL) 25 MG tablet Take 25 mg by mouth at bedtime as needed for itching.    [provider]  oxyCODONE-acetaminophen (PERCOCET/ROXICET) 5-325 MG per tablet Take 1-2 tablets by mouth every 4 (four) hours as needed. 11/03/12   Maud DeedVernon, Sheila, PA-C  sodium chloride 0.9 % SOLN 100 mL with DAPTOmycin 500 MG SOLR 300 mg Inject 300 mg into the vein daily. 11/03/12   Ginnie SmartHatcher, Jeffrey C, MD     Physical Exam:  Vitals:   02/09/18 0928 02/09/18 0930  02/09/18 1100 02/09/18 1219  BP: 90/61 (!) 90/59 (!) 90/58   Pulse: 69 65 71   Resp: 16 16    Temp: 97.7 F (36.5 C)   98.2 F (36.8 C)  TempSrc: Oral   Oral  SpO2: 100% 98% 95%   Weight:    58.9 kg  Height:    5'  4" (1.626 m)   Constitutional: NAD, calm, comfortable Eyes: PERRL, lids and conjunctivae normal ENMT: Mucous membranes are moist, without exudate or lesions  Neck: normal, supple, no masses, no thyromegaly Respiratory: clear to auscultation bilaterally, no wheezing, no crackles. Normal respiratory effort  Cardiovascular: Regular rate and rhythm, no murmur, rubs or gallops. No extremity edema. 2+ pedal pulses. No carotid bruits.  Abdomen: Soft, non tender, No hepatosplenomegaly. Bowel sounds positive.  Musculoskeletal: no clubbing / cyanosis. Moves all extremities.  There is some reproducible pain around the left sternal area, radiating to the left lower rib. Skin: no jaundice, No lesions.  Neurologic: Sensation intact  Strength equal in all extremities Psychiatric:   Alert and oriented x 3. Normal mood.     Labs on Admission: I have personally reviewed following labs and imaging studies  CBC: Recent Labs  Lab 02/08/18 2206  WBC 7.1  HGB 13.6  HCT 39.8  MCV 95.0  PLT 245    Basic Metabolic Panel: Recent Labs  Lab 02/08/18 2206  NA 137  K 3.8  CL 106  CO2 25  GLUCOSE 89  BUN 14  CREATININE 0.89  CALCIUM 9.0    GFR: Estimated Creatinine Clearance: 68.2 mL/min (by C-G formula based on SCr of 0.89 mg/dL).  Liver Function Tests: No results for input(s): AST, ALT, ALKPHOS, BILITOT, PROT, ALBUMIN in the last 168 hours. No results for input(s): LIPASE, AMYLASE in the last 168 hours. No results for input(s): AMMONIA in the last 168 hours.  Coagulation Profile: No results for input(s): INR, PROTIME in the last 168 hours.  Cardiac Enzymes: Recent Labs  Lab 02/08/18 2206  TROPONINI <0.03    BNP (last 3 results) No results for input(s): PROBNP in  the last 8760 hours.  HbA1C: No results for input(s): HGBA1C in the last 72 hours.  CBG: No results for input(s): GLUCAP in the last 168 hours.  Lipid Profile: No results for input(s): CHOL, HDL, LDLCALC, TRIG, CHOLHDL, LDLDIRECT in the last 72 hours.  Thyroid Function Tests: No results for input(s): TSH, T4TOTAL, FREET4, T3FREE, THYROIDAB in the last 72 hours.  Anemia Panel: No results for input(s): VITAMINB12, FOLATE, FERRITIN, TIBC, IRON, RETICCTPCT in the last 72 hours.  Urine analysis: No results found for: COLORURINE, APPEARANCEUR, LABSPEC, PHURINE, GLUCOSEU, HGBUR, BILIRUBINUR, KETONESUR, PROTEINUR, UROBILINOGEN, NITRITE, LEUKOCYTESUR  Sepsis Labs: @LABRCNTIP (procalcitonin:4,lacticidven:4) )No results found for this or any previous visit (from the past 240 hour(s)).   Radiological Exams on Admission: Dg Chest 2 View  Result Date: 02/08/2018 CLINICAL DATA:  Lt cp w/ pain in lt breast, shoulder and arm x 1 hr, similar episode 4 days ago, smoker//a.c. EXAM: CHEST - 2 VIEW COMPARISON:  Chest x-ray dated 04/28/2012. FINDINGS: The heart size and mediastinal contours are within normal limits. Both lungs are clear. No pleural effusion or pneumothorax seen. The visualized skeletal structures are unremarkable. IMPRESSION: No active cardiopulmonary disease. Electronically Signed   By: Bary Richard M.D.   On: 02/08/2018 21:50   Ct Angio Chest Pe W And/or Wo Contrast  Result Date: 02/09/2018 CLINICAL DATA:  Positive D-dimer EXAM: CT ANGIOGRAPHY CHEST WITH CONTRAST TECHNIQUE: Multidetector CT imaging of the chest was performed using the standard protocol during bolus administration of intravenous contrast. Multiplanar CT image reconstructions and MIPs were obtained to evaluate the vascular anatomy. CONTRAST:  ISOVUE-370 IOPAMIDOL (ISOVUE-370) INJECTION 76% COMPARISON:  Chest x-ray 02/08/2017 FINDINGS: Cardiovascular: Satisfactory opacification of the pulmonary arteries to the segmental  level. No evidence of pulmonary embolism. Normal heart  size. No pericardial effusion. Nonaneurysmal aorta. Minimal aortic atherosclerosis. Mediastinum/Nodes: No enlarged mediastinal, hilar, or axillary lymph nodes. Thyroid gland, trachea, and esophagus demonstrate no significant findings. Lungs/Pleura: Moderate emphysema. No acute pulmonary infiltrate or effusion. Upper Abdomen: No acute abnormality. Musculoskeletal: No chest wall abnormality. No acute or significant osseous findings. Review of the MIP images confirms the above findings. IMPRESSION: 1. Negative for acute pulmonary embolus. 2. Moderate emphysema.  No focal pulmonary infiltrate. Aortic Atherosclerosis (ICD10-I70.0) and Emphysema (ICD10-J43.9). Electronically Signed   By: Jasmine PangKim  Fujinaga M.D.   On: 02/09/2018 03:47    EKG: Independently reviewed.  Assessment/Plan Principal Problem:   Chest pain Active Problems:   Tobacco abuse   Neuralgia   Anxiety state   Depression   History of migraine headaches   Emphysema of lung (HCC)  Chest pain syndrome, cardiac versus musculoskeletal vs anxietyversus GI.  Heart pathway score is 4, which puts the patient in greater than 1% risk for MI. troponins were negative.  D-dimer was 0.66, with negative CT angios for PE. EKG shows sinus rhythm, QTC normal at 467, and nonspecific T wave changes.  Risk factors include family history of heart disease-MI.  Admit to Telemetry/ Observation Chest pain order set Cycle troponins EKG in am continue ASA, O2 and NTG as needed GI cocktail Check Lipid panel in a.m. Hb A1C in a.m. Consult to Cards, based on heart pathway score, with recommend serial troponin, and possible stress test after cardiac evaluation.Appreciate involvement..   Emphysema per imaging  Osats normal in RA  WBC normal. No wheezing on exam  Nebs and O2 prn  Will benefit of PFTs as OP and pulmonary evaluation once discharged to improve her respiratory status Tobacco cessation recommended     Tobacco abuse the patient smokes 1 pack a day of cigarettes, and [place] [facility] Continue nicotine patch Counseled cessation  Anxiety and Depression Patient not on medications. Will add Xanax for anxiety as needed   DVT prophylaxis: Heparin subcu Code Status:    Full code Family Communication:  Discussed with patient Disposition Plan: Expect patient to be discharged to home after condition improves Consults called:    Cardiology Admission status: Telemetry observation   Marlowe KaysSara Tenleigh Byer, PA-C [facility]   Amion text  [phone]   02/09/2018, 1:39 PM

## 2018-02-09 NOTE — Progress Notes (Signed)
[facility]. Oriented to unit and call bell. Telemetry applied and CCMD notified. No c/o chest pain. CHG bath given. Patient resting in room.

## 2018-02-09 NOTE — Consult Note (Addendum)
Cardiology Consultation:   Patient ID: Brianna Bowman; [id number]; January 15, 1971   Admit date: 02/08/2018 Date of Consult: 02/09/2018  Primary Care Provider: Patient, No Pcp Per Primary Cardiologist: New to [facility] Primary Electrophysiologist:  None   Patient Profile:   Brianna Bowman is a 47 y.o. female with a PMH of tobacco abuse who is being seen today for the evaluation of chest pain at the request of Dr. Ophelia Charter.  History of Present Illness:   Ms. Fellman was in her usual state of health until last Thursday (02/04/18) when she experienced an episode of chest pain while at work. She reported that she was lifting a few things at [employer] but not doing strenuous activity when she felt sudden onset squeezing chest pain with associated jaw pain, SOB, diaphoresis, and lightheadedness. She states she sat down in the air conditioning and took a baby aspirin with resolution of her symptoms after 10 minutes. She continued to feel fine until last evening when she experienced a less severe episode of left sided chest pressure with associated SOB, with this episode lasting ~1 hour before resolving spontaneously. Given recurrent symptoms, she was encouraged to present to the ED by a family member.   She denies prior cardiac history. She has never had an ischemic evaluation in the past. She has not followed with a PCP recently given difficulty with insurance but denies prior history of CAD, HTN, HLD, or DM. She reports a family history of heart disease in her maternal grandfather who passed away from an MI in his 60s. She reports smoking 1 ppd for the past 20 years.   At the time of this evaluation she is chest pain free. Her only complaint is nicotine cravings and states the patch is not helping. She is fairly active at baseline and reports walking 6-7 miles daily at work without difficulty with chest pain or DOE. She denies recent fever or illness, orthopnea, PND, LE edema, or syncope.    Hospital course: BP on the soft side; intermittently tachypneic, otherwise VSS. Labs notable for electrolytes wnl, Cr 0.89, Hgb 13.6, PLT 245, Ddimer 0.66, Trop negative x1. CTA chest without PE but revealed emphysematous changes. Initial EKG with NSR and diffuse submm STD reflective of baseline wander; repeat EKG with NSR no STE/D and no TWI. She was admitted to medicine with cardiology consulting for further recommendations.   Past Medical History:  Diagnosis Date  . Anemia    with pregnancy  . Anxiety    due to condition  . Depression   . Headache(784.0)    history of  . PONV (postoperative nausea and vomiting)    with epidural anad 04/28/13 BP low and took longer to wake after surgery 04/28/2012    Past Surgical History:  Procedure Laterality Date  . FRACTURE SURGERY     L femur  . HARDWARE REMOVAL Left 11/01/2012   Procedure: HARDWARE REMOVAL;  Surgeon: Eldred Manges, MD;  Location: [facility] OR;  Service: Orthopedics;  Laterality: Left;  Left Ankle Removal Fibula Plate and 10 screws, Irrigation and Debridement  . MANDIBLE FRACTURE SURGERY    . ORIF ANKLE FRACTURE  04/28/2012   Procedure: OPEN REDUCTION INTERNAL FIXATION (ORIF) ANKLE FRACTURE;  Surgeon: Eldred Manges, MD;  Location: [facility] OR;  Service: Orthopedics;  Laterality: Left;  Open reduction internal fixation left ankle bimalleolar fracture     Home Medications:  Prior to Admission medications   Medication Sig Start Date End Date Taking? Authorizing Provider  Aspirin-Salicylamide-Caffeine (BC HEADACHE POWDER PO) Take 1 Package by mouth daily as needed (for pain).    Yes [provider]  oxyCODONE-acetaminophen (PERCOCET/ROXICET) 5-325 MG per tablet Take 1-2 tablets by mouth every 4 (four) hours as needed. Patient not taking: Reported on 02/09/2018 11/03/12   Maud DeedVernon, Sheila, PA-C  sodium chloride 0.9 % SOLN 100 mL with DAPTOmycin 500 MG SOLR 300 mg Inject 300 mg into the vein daily. Patient not taking: Reported on 02/09/2018  11/03/12   Ginnie SmartHatcher, Jeffrey C, MD    Inpatient Medications: Scheduled Meds: . [START ON 02/10/2018] aspirin  324 mg Oral Daily  . nicotine  21 mg Transdermal Daily   Continuous Infusions:  PRN Meds: acetaminophen, albuterol, ALPRAZolam, gi cocktail, morphine injection, nitroGLYCERIN, ondansetron (ZOFRAN) IV  Allergies:    Allergies  Allergen Reactions  . Amoxicillin Anaphylaxis    Has patient had a PCN reaction causing immediate rash, facial/tongue/throat swelling, SOB or lightheadedness with hypotension: Yes Has patient had a PCN reaction causing severe rash involving mucus membranes or skin necrosis: No Has patient had a PCN reaction that required hospitalization: No Has patient had a PCN reaction occurring within the last 10 years: Yes If all of the above answers are "NO", then may proceed with Cephalosporin use.   . Adhesive [Tape] Hives  . Aloe Hives    Social History:   Social History   Socioeconomic History  . Marital status: Legally Separated    Spouse name: Not on file  . Number of children: Not on file  . Years of education: Not on file  . Highest education level: Not on file  Occupational History  . Not on file  Social Needs  . Financial resource strain: Not on file  . Food insecurity:    Worry: Not on file    Inability: Not on file  . Transportation needs:    Medical: Not on file    Non-medical: Not on file  Tobacco Use  . Smoking status: Current Every Day Smoker    Packs/day: 1.00    Years: 20.00    Pack years: 20.00  . Smokeless tobacco: Never Used  Substance and Sexual Activity  . Alcohol use: Yes    Comment: occ  . Drug use: Never  . Sexual activity: Not on file  Lifestyle  . Physical activity:    Days per week: Not on file    Minutes per session: Not on file  . Stress: Not on file  Relationships  . Social connections:    Talks on phone: Not on file    Gets together: Not on file    Attends religious service: Not on file    Active member  of club or organization: Not on file    Attends meetings of clubs or organizations: Not on file    Relationship status: Not on file  . Intimate partner violence:    Fear of current or ex partner: Not on file    Emotionally abused: Not on file    Physically abused: Not on file    Forced sexual activity: Not on file  Other Topics Concern  . Not on file  Social History Narrative  . Not on file    Family History:    Family History  Problem Relation Age of Onset  . Multiple sclerosis Father   . Heart attack Maternal Grandfather        Died in his 5940s     ROS:  Please see the history of present illness.  All other ROS reviewed and negative.     Physical Exam/Data:   Vitals:   02/09/18 0928 02/09/18 0930 02/09/18 1100 02/09/18 1219  BP: 90/61 (!) 90/59 (!) 90/58   Pulse: 69 65 71   Resp: 16 16    Temp: 97.7 F (36.5 C)   98.2 F (36.8 C)  TempSrc: Oral   Oral  SpO2: 100% 98% 95%   Weight:    58.9 kg  Height:    5\' 4"  (1.626 m)    Intake/Output Summary (Last 24 hours) at 02/09/2018 1628 Last data filed at 02/09/2018 1500 Gross per 24 hour  Intake 711.37 ml  Output 600 ml  Net 111.37 ml   Filed Weights   02/08/18 2130 02/09/18 1219  Weight: 60.2 kg 58.9 kg   Body mass index is 22.29 kg/m.  General:  Well nourished, well developed, laying in bed in no acute distress HEENT: sclera anicteric  Neck: no JVD Vascular: No carotid bruits; distal pulses 2+ bilaterally Cardiac:  normal S1, S2; RRR; no murmurs, gallops, or rubs Lungs:  clear to auscultation bilaterally, no wheezing, rhonchi or rales  Abd: NABS, soft, nontender, no hepatomegaly Ext: no edema Musculoskeletal:  No deformities, BUE and BLE strength normal and equal Skin: warm and dry  Neuro:  CNs 2-12 intact, no focal abnormalities noted Psych:  Normal affect   EKG:  The EKG was personally reviewed and demonstrates: Initial EKG with NSR and diffuse submm STD reflective of baseline wander; repeat EKG with  NSR no STE/D and no TWI.  Telemetry:  Telemetry was personally reviewed and demonstrates:  NSR  Relevant CV Studies: None  Laboratory Data:  Chemistry Recent Labs  Lab 02/08/18 2206  NA 137  K 3.8  CL 106  CO2 25  GLUCOSE 89  BUN 14  CREATININE 0.89  CALCIUM 9.0  GFRNONAA >60  GFRAA >60  ANIONGAP 6    No results for input(s): PROT, ALBUMIN, AST, ALT, ALKPHOS, BILITOT in the last 168 hours. Hematology Recent Labs  Lab 02/08/18 2206  WBC 7.1  RBC 4.19  HGB 13.6  HCT 39.8  MCV 95.0  MCH 32.5  MCHC 34.2  RDW 13.6  PLT 245   Cardiac Enzymes Recent Labs  Lab 02/08/18 2206 02/09/18 1443  TROPONINI <0.03 <0.03   No results for input(s): TROPIPOC in the last 168 hours.  BNPNo results for input(s): BNP, PROBNP in the last 168 hours.  DDimer  Recent Labs  Lab 02/08/18 2206  DDIMER 0.66*    Radiology/Studies:  Dg Chest 2 View  Result Date: 02/08/2018 CLINICAL DATA:  Lt cp w/ pain in lt breast, shoulder and arm x 1 hr, similar episode 4 days ago, smoker//a.c. EXAM: CHEST - 2 VIEW COMPARISON:  Chest x-ray dated 04/28/2012. FINDINGS: The heart size and mediastinal contours are within normal limits. Both lungs are clear. No pleural effusion or pneumothorax seen. The visualized skeletal structures are unremarkable. IMPRESSION: No active cardiopulmonary disease. Electronically Signed   By: Bary RichardStan  Maynard M.D.   On: 02/08/2018 21:50   Ct Angio Chest Pe W And/or Wo Contrast  Result Date: 02/09/2018 CLINICAL DATA:  Positive D-dimer EXAM: CT ANGIOGRAPHY CHEST WITH CONTRAST TECHNIQUE: Multidetector CT imaging of the chest was performed using the standard protocol during bolus administration of intravenous contrast. Multiplanar CT image reconstructions and MIPs were obtained to evaluate the vascular anatomy. CONTRAST:  100mL ISOVUE-370 IOPAMIDOL (ISOVUE-370) INJECTION 76% COMPARISON:  Chest x-ray 02/08/2017 FINDINGS: Cardiovascular: Satisfactory opacification of the pulmonary  arteries  to the segmental level. No evidence of pulmonary embolism. Normal heart size. No pericardial effusion. Nonaneurysmal aorta. Minimal aortic atherosclerosis. Mediastinum/Nodes: No enlarged mediastinal, hilar, or axillary lymph nodes. Thyroid gland, trachea, and esophagus demonstrate no significant findings. Lungs/Pleura: Moderate emphysema. No acute pulmonary infiltrate or effusion. Upper Abdomen: No acute abnormality. Musculoskeletal: No chest wall abnormality. No acute or significant osseous findings. Review of the MIP images confirms the above findings. IMPRESSION: 1. Negative for acute pulmonary embolus. 2. Moderate emphysema.  No focal pulmonary infiltrate. Aortic Atherosclerosis (ICD10-I70.0) and Emphysema (ICD10-J43.9). Electronically Signed   By: Jasmine Pang M.D.   On: 02/09/2018 03:47    Assessment and Plan:   1. Chest pain: patient presented with a two episode of chest pain in the past week with both typical/atypcial features. She has never had an ischemic evaluation in the past. Initial EKG with NSR and diffuse submm STD reflective of baseline wander; repeat EKG with NSR no STE/D and no TWI. Trop is negative x2. Ddimer was elevated to 0.66; CTA chest without PE but revealed emphysematous changes. Risk factors for heart disease includes tobacco abuse and family history of MI before age 64.  - Plan for coronary CTA tomorrow to further evaluate chest pain. Keep NPO after MN tonight. - Will hydrate overnight given need for repeat contrast exposure - No need to trend troponin further  - Agree with risk stratification with lipid panel, HgbA1C, and TSH in AM  2. Tobacco abuse: smokes 1ppd. Nicotine patch in place. - Continue to encourage smoking cessation  If Coronary CTA is negative tomorrow, do not anticipate further cardiac needs. She will need to get connected with a PCP.   For questions or updates, please contact [facility] Please consult [email or web address] for contact info under  Cardiology/STEMI.   Signed, Beatriz Stallion, PA-C  02/09/2018 4:28 PM [phone]

## 2018-02-09 NOTE — Progress Notes (Signed)
Patient refusing to allow hanging of IV fluid. She states that her BP always runs low and that it will drive her crazy. Explained to patient the needs along with her BP being low the Iv fluid will help flush the IV Dye out of her system.  She continues to refuse to allow IV fluid.

## 2018-02-09 NOTE — Plan of Care (Signed)

## 2018-02-09 NOTE — ED Provider Notes (Signed)
[facility] EMERGENCY DEPARTMENT Provider Note   CSN: [id number] Arrival date & time: 02/08/18  2119     History   Chief Complaint Chief Complaint  Patient presents with  . Chest Pain    HPI Brianna Bowman is a 47 y.o. female.  The history is provided by the patient. No language interpreter was used.  Chest Pain   This is a new problem. The current episode started more than 2 days ago. The problem occurs rarely. The problem has not changed since onset.The pain is associated with exertion. The pain is present in the substernal region. The pain is moderate. The quality of the pain is described as exertional and dull. The pain radiates to the left neck, left shoulder and left arm (scapula). Associated symptoms include diaphoresis, nausea and shortness of breath. Pertinent negatives include no abdominal pain, no lower extremity edema, no numbness, no palpitations, no syncope and no vomiting. Treatments tried: aspirin. The treatment provided significant relief. Risk factors include smoking/tobacco exposure.  Pertinent negatives for past medical history include no MI.  Her family medical history is significant for early MI.  Procedure history is negative for cardiac catheterization, echocardiogram and EPS study.  Works at [employer] was offloading a truck but boxes were not heavy and became diaphoretic then has CP, SOB with radiation to neck arm and scapula.  Took a BC powder and it went away.  Tonight at rest had a similar. Currently pain free.  Past Medical History:  Diagnosis Date  . Anemia    with pregnancy  . Anxiety    due to condition  . Depression   . Headache(784.0)    history of  . PONV (postoperative nausea and vomiting)    with epidural anad 04/28/13 BP low and took longer to wake after surgery 04/28/2012    Patient Active Problem List   Diagnosis Date Noted  . Tobacco use disorder 11/29/2012  . Bone infection of left ankle (HCC) 11/01/2012  . Closed  fracture of left ankle 04/28/2012    Class: Acute    Past Surgical History:  Procedure Laterality Date  . FRACTURE SURGERY     L femur  . HARDWARE REMOVAL Left 11/01/2012   Procedure: HARDWARE REMOVAL;  Surgeon: Eldred Manges, MD;  Location: [facility] OR;  Service: Orthopedics;  Laterality: Left;  Left Ankle Removal Fibula Plate and 10 screws, Irrigation and Debridement  . MANDIBLE FRACTURE SURGERY    . ORIF ANKLE FRACTURE  04/28/2012   Procedure: OPEN REDUCTION INTERNAL FIXATION (ORIF) ANKLE FRACTURE;  Surgeon: Eldred Manges, MD;  Location: [facility] OR;  Service: Orthopedics;  Laterality: Left;  Open reduction internal fixation left ankle bimalleolar fracture     OB History   None      Home Medications    Prior to Admission medications   Medication Sig Start Date End Date Taking? Authorizing Provider  Aspirin-Salicylamide-Caffeine (BC HEADACHE POWDER PO) Take 1 Package by mouth daily as needed (for pain).     [provider]  diphenhydrAMINE (BENADRYL) 25 MG tablet Take 25 mg by mouth at bedtime as needed for itching.    [provider]  oxyCODONE-acetaminophen (PERCOCET/ROXICET) 5-325 MG per tablet Take 1-2 tablets by mouth every 4 (four) hours as needed. 11/03/12   Maud Deed, PA-C  sodium chloride 0.9 % SOLN 100 mL with DAPTOmycin 500 MG SOLR 300 mg Inject 300 mg into the vein daily. 11/03/12   Ginnie Smart, MD    Family History  Family History  Problem Relation Age of Onset  . Multiple sclerosis Father     Social History Social History   Tobacco Use  . Smoking status: Current Every Day Smoker    Packs/day: 1.00    Years: 20.00    Pack years: 20.00  . Smokeless tobacco: Never Used  Substance Use Topics  . Alcohol use: Yes    Comment: occ  . Drug use: Never     Allergies   Amoxicillin; Adhesive [tape]; and Aloe   Review of Systems Review of Systems  Constitutional: Positive for diaphoresis.  HENT: Negative for congestion.   Respiratory: Positive  for shortness of breath.   Cardiovascular: Positive for chest pain. Negative for palpitations, leg swelling and syncope.  Gastrointestinal: Positive for nausea. Negative for abdominal pain and vomiting.  Neurological: Negative for numbness.  All other systems reviewed and are negative.    Physical Exam Updated Vital Signs BP 99/62   Pulse 81   Temp 98.2 F (36.8 C) (Oral)   Resp (!) 21   Ht 5\' 4"  (1.626 m)   Wt 60.2 kg   LMP 01/16/2018   SpO2 98%   BMI 22.78 kg/m   Physical Exam  Constitutional: She is oriented to person, place, and time. She appears well-developed and well-nourished.  HENT:  Head: Normocephalic and atraumatic.  Mouth/Throat: No oropharyngeal exudate.  Eyes: Pupils are equal, round, and reactive to light. Conjunctivae are normal.  Neck: Normal range of motion. Neck supple.  Cardiovascular: Normal rate, regular rhythm, normal heart sounds and intact distal pulses.  Pulmonary/Chest: Effort normal and breath sounds normal. No stridor. No respiratory distress. She has no wheezes. She has no rales.  Abdominal: Soft. Bowel sounds are normal. She exhibits no mass. There is no tenderness. There is no rebound and no guarding.  Musculoskeletal: Normal range of motion. She exhibits no edema.  Neurological: She is alert and oriented to person, place, and time. She displays normal reflexes.  Skin: Skin is warm and dry. Capillary refill takes less than 2 seconds. She is not diaphoretic.  Psychiatric: She has a normal mood and affect.     ED Treatments / Results  Labs (all labs ordered are listed, but only abnormal results are displayed) Labs Reviewed  D-DIMER, QUANTITATIVE (NOT AT [facility]) - Abnormal; Notable for the following components:      Result Value   D-Dimer, Quant 0.66 (*)    All other components within normal limits  BASIC METABOLIC PANEL  CBC  TROPONIN I  PREGNANCY, URINE    EKG EKG Interpretation  Date/Time:  Tuesday February 09 2018 00:38:23  EDT Ventricular Rate:  73 PR Interval:  152 QRS Duration: 98 QT Interval:  401 QTC Calculation: 442 R Axis:   59 Text Interpretation:  Sinus rhythm Confirmed by Nicanor AlconPalumbo, Lavone Weisel ([id number]) on 02/09/2018 12:56:12 AM   Radiology Dg Chest 2 View  Result Date: 02/08/2018 CLINICAL DATA:  Lt cp w/ pain in lt breast, shoulder and arm x 1 hr, similar episode 4 days ago, smoker//a.c. EXAM: CHEST - 2 VIEW COMPARISON:  Chest x-ray dated 04/28/2012. FINDINGS: The heart size and mediastinal contours are within normal limits. Both lungs are clear. No pleural effusion or pneumothorax seen. The visualized skeletal structures are unremarkable. IMPRESSION: No active cardiopulmonary disease. Electronically Signed   By: Bary RichardStan  Maynard M.D.   On: 02/08/2018 21:50   Ct Angio Chest Pe W And/or Wo Contrast  Result Date: 02/09/2018 CLINICAL DATA:  Positive D-dimer EXAM: CT ANGIOGRAPHY CHEST  WITH CONTRAST TECHNIQUE: Multidetector CT imaging of the chest was performed using the standard protocol during bolus administration of intravenous contrast. Multiplanar CT image reconstructions and MIPs were obtained to evaluate the vascular anatomy. CONTRAST:  100mL ISOVUE-370 IOPAMIDOL (ISOVUE-370) INJECTION 76% COMPARISON:  Chest x-ray 02/08/2017 FINDINGS: Cardiovascular: Satisfactory opacification of the pulmonary arteries to the segmental level. No evidence of pulmonary embolism. Normal heart size. No pericardial effusion. Nonaneurysmal aorta. Minimal aortic atherosclerosis. Mediastinum/Nodes: No enlarged mediastinal, hilar, or axillary lymph nodes. Thyroid gland, trachea, and esophagus demonstrate no significant findings. Lungs/Pleura: Moderate emphysema. No acute pulmonary infiltrate or effusion. Upper Abdomen: No acute abnormality. Musculoskeletal: No chest wall abnormality. No acute or significant osseous findings. Review of the MIP images confirms the above findings. IMPRESSION: 1. Negative for acute pulmonary embolus. 2. Moderate  emphysema.  No focal pulmonary infiltrate. Aortic Atherosclerosis (ICD10-I70.0) and Emphysema (ICD10-J43.9). Electronically Signed   By: Jasmine PangKim  Fujinaga M.D.   On: 02/09/2018 03:47    Procedures Procedures (including critical care time)  Medications Ordered in ED Medications  iopamidol (ISOVUE-370) 76 % injection 100 mL (100 mLs Intravenous Contrast Given 02/09/18 0258)      Final Clinical Impressions(s) / ED Diagnoses   Final diagnoses:  Chest pain, unspecified type    Will admit due to concerning story and calcifications seen on CT scan.  Patient was a heart score of 4 prior to CT she is now a five and needs a full rule out with additional testing.     Maude Gloor, MD 02/09/18 [phone]

## 2018-02-09 NOTE — ED Notes (Signed)
Patient states while shopping Thursday she experienced chest pains she describes as a snake wrapped tightly around her chest; states she was diaphoretic and short of breath with this chest pain; states the pain resolved shortly after. Patient then states this evening she experienced chest pains but denies any diaphoresis. Patient now states pain is 0/10.

## 2018-02-09 NOTE — ED Notes (Signed)
Patient transported to CT 

## 2018-02-09 NOTE — Care Management (Signed)
This is a no charge note  [facility] Dr[facility] Nicanor AlconPalumbo  47 year old lady with past medical history of tobacco abuse, headache, depression with anxiety, who presents with chest pain.  Per ED physician, patient had chest pain on Thursday, which resolved after taking BC powder.  Today her chest pain is located in the substernal area, and associated with diaphoresis and nausea, radiating to the right scapular and shoulder area.  Currently patient is chest pain-free.  Patient was found to have negative troponin, WBC 7.1, negative urinalysis, electrolytes renal function okay, soft blood pressure, no tachycardia, RR 21, oxygen saturation 98% on room air, temperature normal, chest x-ray negative.  D-dimer +0.66, but a CT angiogram is negative for PE.  EKG showed sinus rhythm, low voltage, QTC 467, nonspecific T wave change. Patient is placed on telemetry bed for observation.  Please call manager of [facility] at [phone] when pt arrives to floor   Lorretta HarpXilin Moosa Bueche, MD  [facility] Pager [phone]  If 7PM-7AM, please contact night-coverage [email or web address] Password [email or web address] 02/09/2018, 4:12 AM

## 2018-02-10 ENCOUNTER — Observation Stay (HOSPITAL_COMMUNITY): Payer: Medicaid Other

## 2018-02-10 DIAGNOSIS — F411 Generalized anxiety disorder: Secondary | ICD-10-CM

## 2018-02-10 DIAGNOSIS — R079 Chest pain, unspecified: Secondary | ICD-10-CM

## 2018-02-10 DIAGNOSIS — Z720 Tobacco use: Secondary | ICD-10-CM

## 2018-02-10 LAB — HEMOGLOBIN A1C
Hgb A1c MFr Bld: 5.1 % (ref 4.8–5.6)
MEAN PLASMA GLUCOSE: 99.67 mg/dL

## 2018-02-10 LAB — BASIC METABOLIC PANEL
Anion gap: 5 (ref 5–15)
BUN: 13 mg/dL (ref 6–20)
CALCIUM: 8.9 mg/dL (ref 8.9–10.3)
CO2: 22 mmol/L (ref 22–32)
CREATININE: 0.94 mg/dL (ref 0.44–1.00)
Chloride: 114 mmol/L — ABNORMAL HIGH (ref 98–111)
GFR calc non Af Amer: >60 mL/min (ref >60)
Glucose, Bld: 122 mg/dL — ABNORMAL HIGH (ref 70–99)
Potassium: 3.7 mmol/L (ref 3.5–5.1)
SODIUM: 141 mmol/L (ref 135–145)

## 2018-02-10 LAB — CBC
HCT: 37.9 % (ref 36.0–46.0)
Hemoglobin: 12.2 g/dL (ref 12.0–15.0)
MCH: 31.6 pg (ref 26.0–34.0)
MCHC: 32.2 g/dL (ref 30.0–36.0)
MCV: 98.2 fL (ref 78.0–100.0)
Platelets: 241 10*3/uL (ref 150–400)
RBC: 3.86 MIL/uL — ABNORMAL LOW (ref 3.87–5.11)
RDW: 13.7 % (ref 11.5–15.5)
WBC: 6.1 10*3/uL (ref 4.0–10.5)

## 2018-02-10 LAB — LIPID PANEL
CHOLESTEROL: 191 mg/dL (ref 0–200)
HDL: 30 mg/dL — ABNORMAL LOW (ref >40)
LDL CALC: 125 mg/dL — AB (ref 0–99)
TRIGLYCERIDES: 181 mg/dL — AB (ref <150)
Total CHOL/HDL Ratio: 6.4 RATIO
VLDL: 36 mg/dL (ref 0–40)

## 2018-02-10 MED ORDER — SODIUM CHLORIDE 0.9 % IV BOLUS
250.0000 mL | Freq: Once | INTRAVENOUS | Status: AC
  Administered 2018-02-10: 250 mL via INTRAVENOUS

## 2018-02-10 MED ORDER — SODIUM CHLORIDE 0.9 % IV BOLUS: 250.0000 mL | Freq: Once | INTRAVENOUS | Status: DC

## 2018-02-10 MED ORDER — NITROGLYCERIN 0.4 MG SL SUBL
SUBLINGUAL_TABLET | SUBLINGUAL | Status: AC
  Filled 2018-02-10: qty 2

## 2018-02-10 MED ORDER — IOPAMIDOL (ISOVUE-370) INJECTION 76%
100.0000 mL | Freq: Once | INTRAVENOUS | Status: AC
  Administered 2018-02-10: 80 mL via INTRAVENOUS

## 2018-02-10 MED ORDER — ATORVASTATIN CALCIUM 20 MG PO TABS: 20.0000 mg | ORAL_TABLET | Freq: Every day | ORAL | 0 refills | Status: DC

## 2018-02-10 MED ORDER — NITROGLYCERIN 0.4 MG SL SUBL
0.8000 mg | SUBLINGUAL_TABLET | Freq: Once | SUBLINGUAL | Status: AC
  Administered 2018-02-10: 0.8 mg via SUBLINGUAL

## 2018-02-10 NOTE — Discharge Summary (Signed)
Physician Discharge Summary  TARIKA MCKETHAN OHY:[id number] DOB: September 17, 1970 DOA: 02/08/2018  PCP: Patient, No Pcp Per- [facility]  Admit date: 02/08/2018 Discharge date: 02/10/2018  Admitted From: home Discharge disposition: home   Recommendations for Outpatient Follow-Up:   Statin started Recommend aggressive risk factor modification with lipid lowering therapy and smoking cessation  Discharge Diagnosis:   Principal Problem:   Chest pain Active Problems:   Tobacco abuse   Neuralgia   Anxiety state   Depression   History of migraine headaches   Emphysema of lung (HCC)    Discharge Condition: Improved.  Diet recommendation: Low sodium, heart healthy.  Wound care: None.  Code status: Full.   History of Present Illness:   Brianna Bowman is a 47 y.o. female with a Past Medical History of depression/anxiety and tobacco dependence who presents with chest pain.  She reports one episode about 6 months ago; another episode last Thursday; and an episode last night.  Thursday's episode was left-sided/substernal with radiation to her left jaw/arm and associated with diaphoresis/SOB and may have been exertional.  Last night's episode was non-exertional and did not radiate; it resolved spontaneously.  On exam, she appears older than stated age and anxious but in NAD.  Poor dentition.  Exam otherwise unremarkable.   Hospital Course by Problem:   Chest pain: - Coronary CTA revealed less than 30% stenosis in the LAD and Cx, otherwise normal coronaries.  - Recommend aggressive risk factor modification. LDL was 125 - will start atorvastatin 20mg  daily.   Dyslipidemia: LDL 125 this admission - Will start atorvastatin 20mg  daily  Tobacco abuse: - Continue to encourage smoking cessation    Medical Consultants:   cards   Discharge Exam:   Vitals:   02/10/18 1400 02/10/18 1439  BP: (!) 96/59 97/82  Pulse: (!) 58   Resp:      Temp:    SpO2: 98%    Vitals:   02/10/18 1251 02/10/18 1345 02/10/18 1400 02/10/18 1439  BP: (!) 85/53 94/65 (!) 96/59 97/82  Pulse: 75 62 (!) 58   Resp:      Temp:      TempSrc:      SpO2:  100% 98%   Weight:      Height:        General exam: Appears calm and comfortable.    The results of significant diagnostics from this hospitalization (including imaging, microbiology, ancillary and laboratory) are listed below for reference.     Procedures and Diagnostic Studies:   Dg Chest 2 View  Result Date: 02/08/2018 CLINICAL DATA:  Lt cp w/ pain in lt breast, shoulder and arm x 1 hr, similar episode 4 days ago, smoker//a.c. EXAM: CHEST - 2 VIEW COMPARISON:  Chest x-ray dated 04/28/2012. FINDINGS: The heart size and mediastinal contours are within normal limits. Both lungs are clear. No pleural effusion or pneumothorax seen. The visualized skeletal structures are unremarkable. IMPRESSION: No active cardiopulmonary disease. Electronically Signed   By: Bary Richard M.D.   On: 02/08/2018 21:50   Ct Angio Chest Pe W And/or Wo Contrast  Result Date: 02/09/2018 CLINICAL DATA:  Positive D-dimer EXAM: CT ANGIOGRAPHY CHEST WITH CONTRAST TECHNIQUE: Multidetector CT imaging of the chest was performed using the standard protocol during bolus administration of intravenous contrast. Multiplanar CT image reconstructions and MIPs were obtained to evaluate the vascular anatomy. CONTRAST:  ISOVUE-370 IOPAMIDOL (ISOVUE-370) INJECTION 76% COMPARISON:  Chest x-ray 02/08/2017 FINDINGS:  Cardiovascular: Satisfactory opacification of the pulmonary arteries to the segmental level. No evidence of pulmonary embolism. Normal heart size. No pericardial effusion. Nonaneurysmal aorta. Minimal aortic atherosclerosis. Mediastinum/Nodes: No enlarged mediastinal, hilar, or axillary lymph nodes. Thyroid gland, trachea, and esophagus demonstrate no significant findings. Lungs/Pleura: Moderate emphysema. No acute pulmonary  infiltrate or effusion. Upper Abdomen: No acute abnormality. Musculoskeletal: No chest wall abnormality. No acute or significant osseous findings. Review of the MIP images confirms the above findings. IMPRESSION: 1. Negative for acute pulmonary embolus. 2. Moderate emphysema.  No focal pulmonary infiltrate. Aortic Atherosclerosis (ICD10-I70.0) and Emphysema (ICD10-J43.9). Electronically Signed   By: Jasmine PangKim  Fujinaga M.D.   On: 02/09/2018 03:47     Labs:   Basic Metabolic Panel: Recent Labs  Lab 02/08/18 2206 02/10/18 0305  NA 137 141  K 3.8 3.7  CL 106 114*  CO2 25 22  GLUCOSE 89 122*  BUN 14 13  CREATININE 0.89 0.94  CALCIUM 9.0 8.9   GFR Estimated Creatinine Clearance: 64.6 mL/min (by C-G formula based on SCr of 0.94 mg/dL). Liver Function Tests: No results for input(s): AST, ALT, ALKPHOS, BILITOT, PROT, ALBUMIN in the last 168 hours. No results for input(s): LIPASE, AMYLASE in the last 168 hours. No results for input(s): AMMONIA in the last 168 hours. Coagulation profile No results for input(s): INR, PROTIME in the last 168 hours.  CBC: Recent Labs  Lab 02/08/18 2206 02/10/18 0305  WBC 7.1 6.1  HGB 13.6 12.2  HCT 39.8 37.9  MCV 95.0 98.2  PLT 245 241   Cardiac Enzymes: Recent Labs  Lab 02/08/18 2206 02/09/18 1443  TROPONINI <0.03 <0.03   BNP: Invalid input(s): POCBNP CBG: No results for input(s): GLUCAP in the last 168 hours. D-Dimer Recent Labs    02/08/18 2206  DDIMER 0.66*   Hgb A1c Recent Labs    02/10/18 0305  HGBA1C 5.1   Lipid Profile Recent Labs    02/10/18 0305  CHOL 191  HDL 30*  LDLCALC 125*  TRIG 181*  CHOLHDL 6.4   Thyroid function studies Recent Labs    02/09/18 1635  TSH 1.034   Anemia work up No results for input(s): VITAMINB12, FOLATE, FERRITIN, TIBC, IRON, RETICCTPCT in the last 72 hours. Microbiology No results found for this or any previous visit (from the past 240 hour(s)).   Discharge Instructions:    Discharge Instructions    Diet - low sodium heart healthy   Complete by:  As directed    Increase activity slowly   Complete by:  As directed      Allergies as of 02/10/2018      Reactions   Amoxicillin Anaphylaxis   Has patient had a PCN reaction causing immediate rash, facial/tongue/throat swelling, SOB or lightheadedness with hypotension: Yes Has patient had a PCN reaction causing severe rash involving mucus membranes or skin necrosis: No Has patient had a PCN reaction that required hospitalization: No Has patient had a PCN reaction occurring within the last 10 years: Yes If all of the above answers are "NO", then may proceed with Cephalosporin use.   Adhesive [tape] Hives   Aloe Hives      Medication List    STOP taking these medications   oxyCODONE-acetaminophen 5-325 MG tablet Commonly known as:  PERCOCET/ROXICET   sodium chloride 0.9 % SOLN 100 mL with DAPTOmycin 500 MG SOLR 300 mg     TAKE these medications   atorvastatin 20 MG tablet Commonly known as:  LIPITOR Take 1 tablet (20 mg  total) by mouth daily.   BC HEADACHE POWDER PO Take 1 Package by mouth daily as needed (for pain).      Follow-up Information    [facility]. Go on 03/15/2018.   Why:  appointment at 2:30. Please bring your ID and arrive 15 minutes early. Because you have this appointment you may use the [facility] at [place].  Contact information: [place] [place] [phone]       [facility] Follow up.   Why:  You may use the DISCOUNT PHARMACY at this location at discharge from the hospital.  Contact information: [address] [place] [phone]           Time coordinating discharge: 25 min  Signed:  Joseph ArtJessica U Reda Gettis  [facility] 02/10/2018, 4:37 PM

## 2018-02-10 NOTE — Progress Notes (Signed)
Discharge instructions given to patient. Prescription given. All questions answered. IV removed, clean and intact. Telemetry removed. Husband to escort home.  Versie StarksHanna  Ximena Todaro, RN

## 2018-02-10 NOTE — Progress Notes (Signed)
Progress Note  Patient Name: Brianna Bowman Date of Encounter: 02/10/2018  Primary Cardiologist: No primary care provider on file.   Subjective   No recurrent chest pain. Ready to go home. Denies SOB or palpitations  Inpatient Medications    Scheduled Meds: . nicotine  21 mg Transdermal Daily  . nitroGLYCERIN       Continuous Infusions: . sodium chloride 75 mL/hr at 02/09/18 2307   PRN Meds: acetaminophen, albuterol, gi cocktail, morphine injection, nitroGLYCERIN, ondansetron (ZOFRAN) IV   Vital Signs    Vitals:   02/10/18 1251 02/10/18 1345 02/10/18 1400 02/10/18 1439  BP: (!) 85/53 94/65 (!) 96/59 97/82  Pulse: 75 62 (!) 58   Resp:      Temp:      TempSrc:      SpO2:  100% 98%   Weight:      Height:        Intake/Output Summary (Last 24 hours) at 02/10/2018 1530 Last data filed at 02/10/2018 1220 Gross per 24 hour  Intake 240 ml  Output -  Net 240 ml   Filed Weights   02/08/18 2130 02/09/18 1219  Weight: 60.2 kg 58.9 kg    Telemetry    NSR - Personally Reviewed  Physical Exam   GEN: No acute distress.   Neck: No JVD, no carotid bruits Cardiac: RRR, no murmurs, rubs, or gallops.  Respiratory: Clear to auscultation bilaterally, no wheezes/ rales/ rhonchi GI: NABS, Soft, nontender, non-distended  MS: No edema; No deformity. Neuro:  Nonfocal, moving all extremities spontaneously Psych: Normal affect   Labs    Chemistry Recent Labs  Lab 02/08/18 2206 02/10/18 0305  NA 137 141  K 3.8 3.7  CL 106 114*  CO2 25 22  GLUCOSE 89 122*  BUN 14 13  CREATININE 0.89 0.94  CALCIUM 9.0 8.9  GFRNONAA >60 >60  GFRAA >60 >60  ANIONGAP 6 5     Hematology Recent Labs  Lab 02/08/18 2206 02/10/18 0305  WBC 7.1 6.1  RBC 4.19 3.86*  HGB 13.6 12.2  HCT 39.8 37.9  MCV 95.0 98.2  MCH 32.5 31.6  MCHC 34.2 32.2  RDW 13.6 13.7  PLT 245 241    Cardiac Enzymes Recent Labs  Lab 02/08/18 2206 02/09/18 1443  TROPONINI <0.03 <0.03   No results for  input(s): TROPIPOC in the last 168 hours.   BNPNo results for input(s): BNP, PROBNP in the last 168 hours.   DDimer  Recent Labs  Lab 02/08/18 2206  DDIMER 0.66*     Radiology    Dg Chest 2 View  Result Date: 02/08/2018 CLINICAL DATA:  Lt cp w/ pain in lt breast, shoulder and arm x 1 hr, similar episode 4 days ago, smoker//a.c. EXAM: CHEST - 2 VIEW COMPARISON:  Chest x-ray dated 04/28/2012. FINDINGS: The heart size and mediastinal contours are within normal limits. Both lungs are clear. No pleural effusion or pneumothorax seen. The visualized skeletal structures are unremarkable. IMPRESSION: No active cardiopulmonary disease. Electronically Signed   By: Bary Richard M.D.   On: 02/08/2018 21:50   Ct Angio Chest Pe W And/or Wo Contrast  Result Date: 02/09/2018 CLINICAL DATA:  Positive D-dimer EXAM: CT ANGIOGRAPHY CHEST WITH CONTRAST TECHNIQUE: Multidetector CT imaging of the chest was performed using the standard protocol during bolus administration of intravenous contrast. Multiplanar CT image reconstructions and MIPs were obtained to evaluate the vascular anatomy. CONTRAST:  ISOVUE-370 IOPAMIDOL (ISOVUE-370) INJECTION 76% COMPARISON:  Chest x-ray 02/08/2017 FINDINGS:  Cardiovascular: Satisfactory opacification of the pulmonary arteries to the segmental level. No evidence of pulmonary embolism. Normal heart size. No pericardial effusion. Nonaneurysmal aorta. Minimal aortic atherosclerosis. Mediastinum/Nodes: No enlarged mediastinal, hilar, or axillary lymph nodes. Thyroid gland, trachea, and esophagus demonstrate no significant findings. Lungs/Pleura: Moderate emphysema. No acute pulmonary infiltrate or effusion. Upper Abdomen: No acute abnormality. Musculoskeletal: No chest wall abnormality. No acute or significant osseous findings. Review of the MIP images confirms the above findings. IMPRESSION: 1. Negative for acute pulmonary embolus. 2. Moderate emphysema.  No focal pulmonary infiltrate.  Aortic Atherosclerosis (ICD10-I70.0) and Emphysema (ICD10-J43.9). Electronically Signed   By: Jasmine PangKim  Fujinaga M.D.   On: 02/09/2018 03:47   Ct Coronary Morph W/cta Cor W/score W/ca W/cm &/or Wo/cm  Addendum Date: 02/10/2018   ADDENDUM REPORT: 02/10/2018 15:10 CLINICAL DATA:  Chest pain EXAM: Cardiac CTA MEDICATIONS: Sub lingual nitro. 4mg  and lopressor 19mg  TECHNIQUE: The patient was scanned on a Siemens 192 slice scanner. Gantry rotation speed was 270 msecs. Collimation was .9mm. A 100 kV prospective scan was triggered in the descending thoracic aorta at 111 HU's with 5% padding centered around 78% of the R-R interval. Average HR during the scan was 64 bpm. The 3D data set was interpreted on a dedicated work station using MPR, MIP and VRT modes. A total of 80 cc of contrast was used. FINDINGS: Non-cardiac: See separate report from [facility]. No significant findings on limited lung and soft tissue windows. Calcium Score: Less than one with isolated area in mid LAD Coronary Arteries: Right dominant with no anomalies LM: Normal LAD: Less than 30% mixed plaque in mid LAD IM: Less than 30% soft plaque in proximal vessel D1: Normal D2: Normal Circumflex: Less than 30% soft plaque in proximal and mid vessel OM1: Normal OM2: Normal RCA: Normal PDA: Normal PLA: Normal IMPRESSION: 1. Calcium score less than 1 isolated area in mid LAD 2.  Normal aortic root 2.9 cm 3.  Non obstructive CAD see description above Charlton HawsPeter Nishan Electronically Signed   By: Charlton HawsPeter  Nishan M.D.   On: 02/10/2018 15:10   Result Date: 02/10/2018 EXAM: OVER-READ INTERPRETATION  CT CHEST The following report is an over-read performed by radiologist Dr. Charlett NoseKevin Dover of [facility] on 02/10/2018. This over-read does not include interpretation of cardiac or coronary anatomy or pathology. The coronary CTA interpretation by the cardiologist is attached. COMPARISON:  02/09/2018 FINDINGS: Vascular: Heart is normal in size.  Aorta is  normal caliber. Mediastinum/Nodes: No adenopathy in the lower mediastinum or hila. Lungs/Pleura: Dependent atelectasis in the lower lobes. No effusions. Upper Abdomen: Imaging into the upper abdomen shows no acute findings. Musculoskeletal: Chest wall soft tissues are unremarkable. No acute bony abnormality. IMPRESSION: No acute or significant extracardiac abnormality. Electronically Signed: By: Charlett NoseKevin  Dover M.D. On: 02/10/2018 14:19    Cardiac Studies   Coronary CTA 02/10/18: FINDINGS: Non-cardiac: See separate report [facility]. No significant findings on limited lung and soft tissue windows.  Calcium Score: Less than one with isolated area in mid LAD  Coronary Arteries: Right dominant with no anomalies  LM: Normal  LAD: Less than 30% mixed plaque in mid LAD  IM: Less than 30% soft plaque in proximal vessel  D1: Normal  D2: Normal  Circumflex: Less than 30% soft plaque in proximal and mid vessel  OM1: Normal  OM2: Normal  RCA: Normal  PDA: Normal  PLA: Normal  IMPRESSION: 1. Calcium score less than 1 isolated area in mid LAD  2.  Normal aortic root 2.9 cm  3.  Non obstructive CAD see description above  Patient Profile     47 y.o. female with a PMH of tobacco abuse who was seen by cardiology for the evaluation of chest pain  Assessment & Plan    1. Chest pain: patient presented with a two episode of chest pain in the past week with both typical/atypcial features. She has never had an ischemic evaluation in the past. Initial EKG with NSR and diffuse submm STD reflective of baseline wander; repeat EKG with NSR no STE/D and no TWI. Trop is negative x2. Ddimer was elevated to 0.66; CTA chest without PE but revealed emphysematous changes. Coronary CTA revealed less than 30% stenosis in the LAD and Cx, otherwise normal coronaries.  - Recommend aggressive risk factor modification. LDL was 125 - will start atorvastatin 20mg  daily. Blood  pressure was well controlled and HgbA1C was 5.1. Continue to encourage smoking cessation  2. Dyslipidemia: LDL 125 this admission - Will start atorvastatin 20mg  daily  3. Tobacco abuse: smokes 1ppd. Nicotine patch in place. Counseled on cessation. - Continue to encourage smoking cessation  [facility] will sign off.   Medication Recommendations:  Atorvastatin 20mg  daily Other recommendations (labs, testing, etc):  None Follow up as an outpatient:  No need to follow with cardiology. Would benefit from PCP care going forward.    For questions or updates, please contact [facility] Please consult [email or web address] for contact info under Cardiology/STEMI.      Signed, Beatriz StallionKrista M. Lorina Duffner, PA-C  02/10/2018, 3:30 PM   [phone]

## 2018-02-10 NOTE — Progress Notes (Signed)
Awaiting CTA coronaries today-- if WNL, hope for d/c later today. Marlin CanaryJessica Salena Ortlieb DO

## 2018-02-10 NOTE — Progress Notes (Signed)
Pt for Ct cornonary. BP 85/53, HR 67. Pt asymptomatic.  Brianna BolderKrista Kroegar PA-C paged. Orders for 250cc bolus and to give the 50mg  of metoprolol. Will continue to monitor.  Brianna StarksHanna  Chesney Klimaszewski, RN

## 2018-02-10 NOTE — Progress Notes (Signed)
Heart rate in CT 55-58

## 2018-02-10 NOTE — Care Management Note (Signed)
Case Management Note  Patient Details  Name: Brianna Bowman MRN: [id number] Date of Birth: 1970-11-26  Subjective/Objective:          Chest Pain          Action/Plan:  First available appointment that could be made with local indegent clinics is September 23 at 2:30, added to AVS. [facility].   Will need MATCH.   Expected Discharge Date:  02/10/18               Expected Discharge Plan:  Home/Self Care  In-House Referral:     Discharge planning Services  CM Consult, Follow-up appt scheduled  Post Acute Care Choice:    Choice offered to:     DME Arranged:    DME Agency:     HH Arranged:    HH Agency:     Status of Service:  In process, will continue to follow  If discussed at Long Length of Stay Meetings, dates discussed:    Additional Comments:  Lawerance SabalDebbie Sabian Kuba, RN 02/10/2018, 2:43 PM

## 2018-02-10 NOTE — Progress Notes (Signed)
Spoke with CT-- CT will try to get pt for CT scan around 11:30-12:30. Several pts on schedule today- unsure of specific time. CT to instruct RN on when to given metoprolol. Will continue to monitor.  Versie StarksHanna  Mouhamadou Gittleman, RN

## 2018-02-10 NOTE — Care Management (Signed)
Patient provided with MATCH letter.  

## 2018-03-15 ENCOUNTER — Inpatient Hospital Stay (INDEPENDENT_AMBULATORY_CARE_PROVIDER_SITE_OTHER): Payer: Medicaid Other | Admitting: Physician Assistant

## 2019-04-11 IMAGING — DX DG CHEST 2V
2 series · 2 of 2 positions shown · non-contrast
Comparison: Chest x-ray dated 04/28/2012.

CLINICAL DATA: Lt cp w/ pain in lt breast, shoulder and arm x 1 hr,
similar episode 4 days ago, smoker//a.c.

EXAM:
CHEST - 2 VIEW

[chest pa]
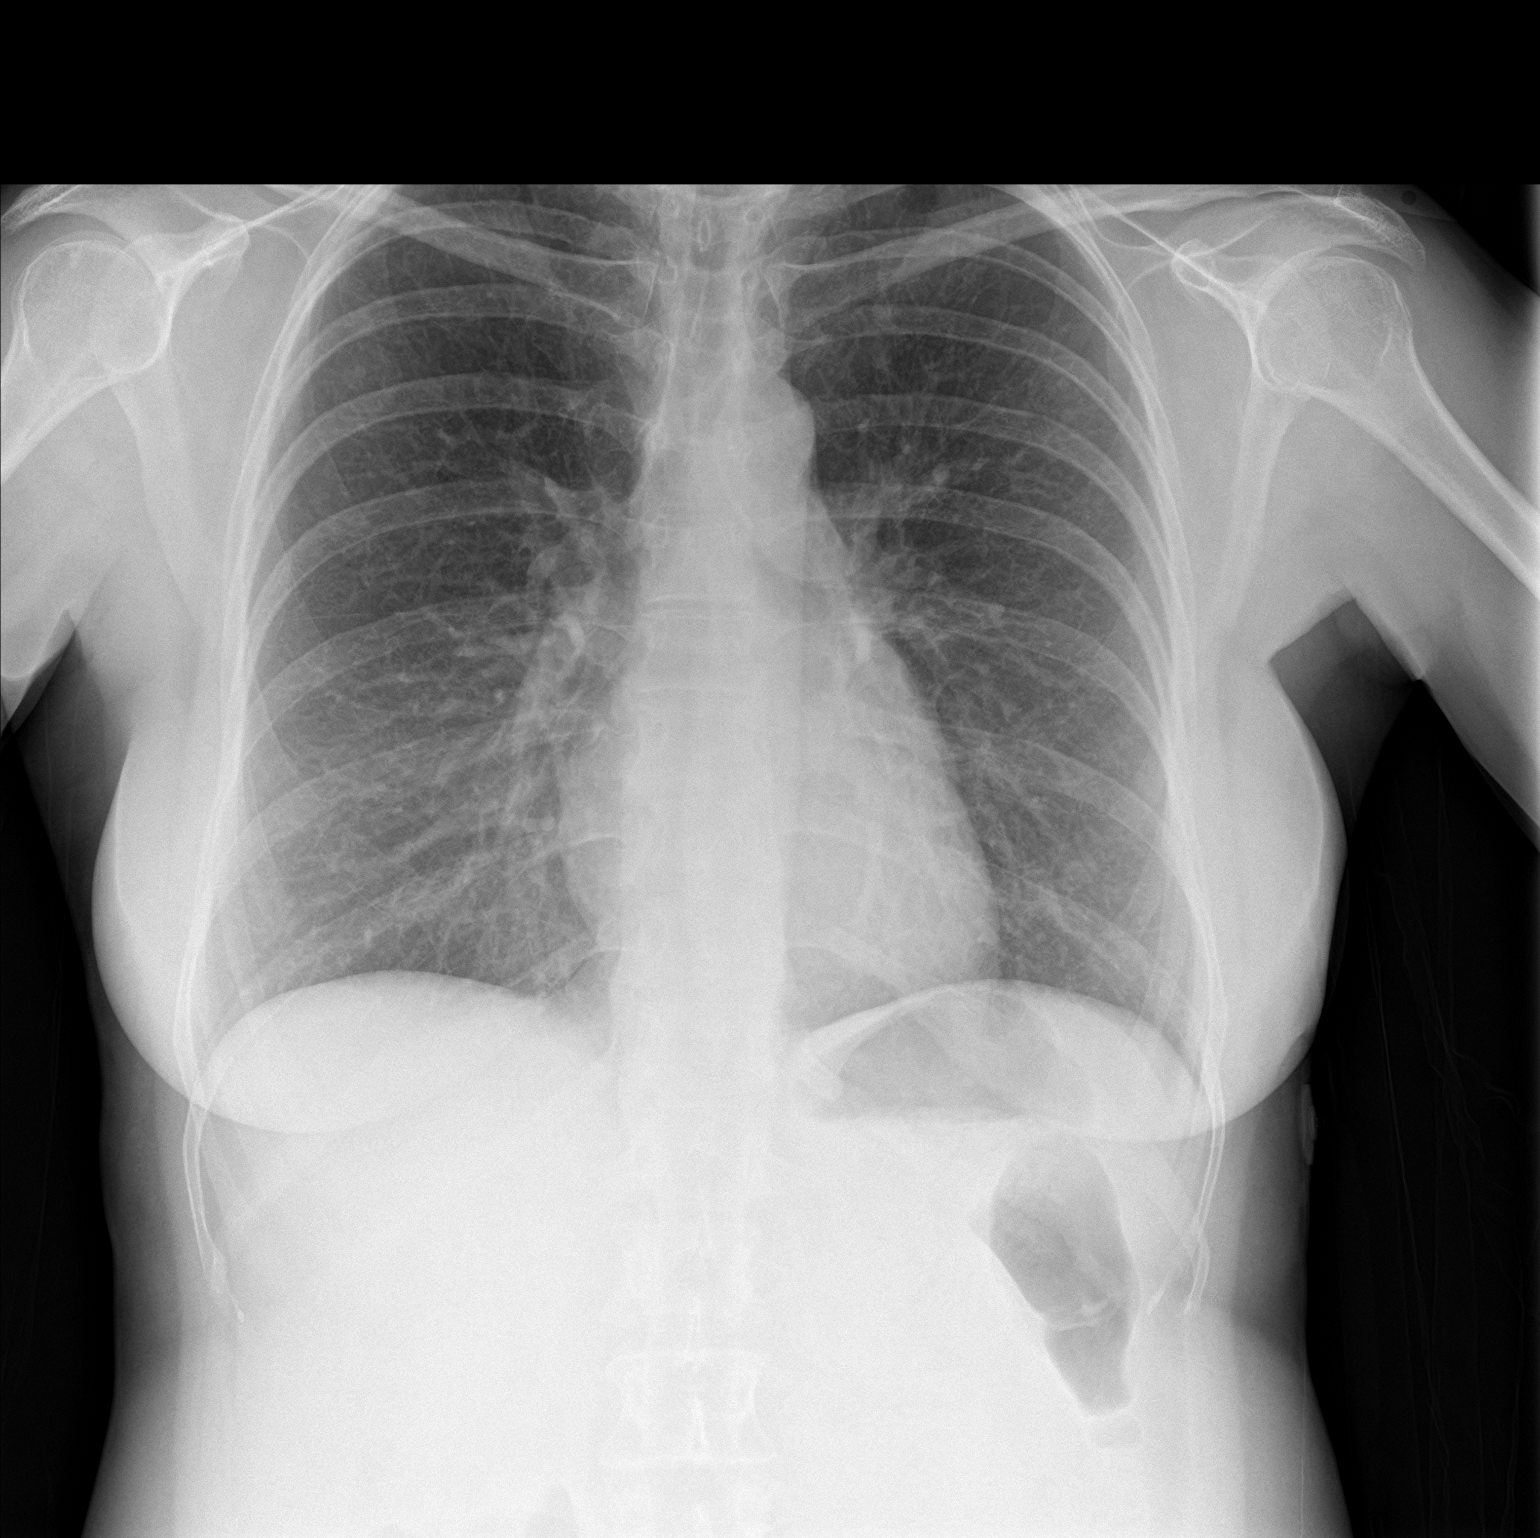

[chest lat]
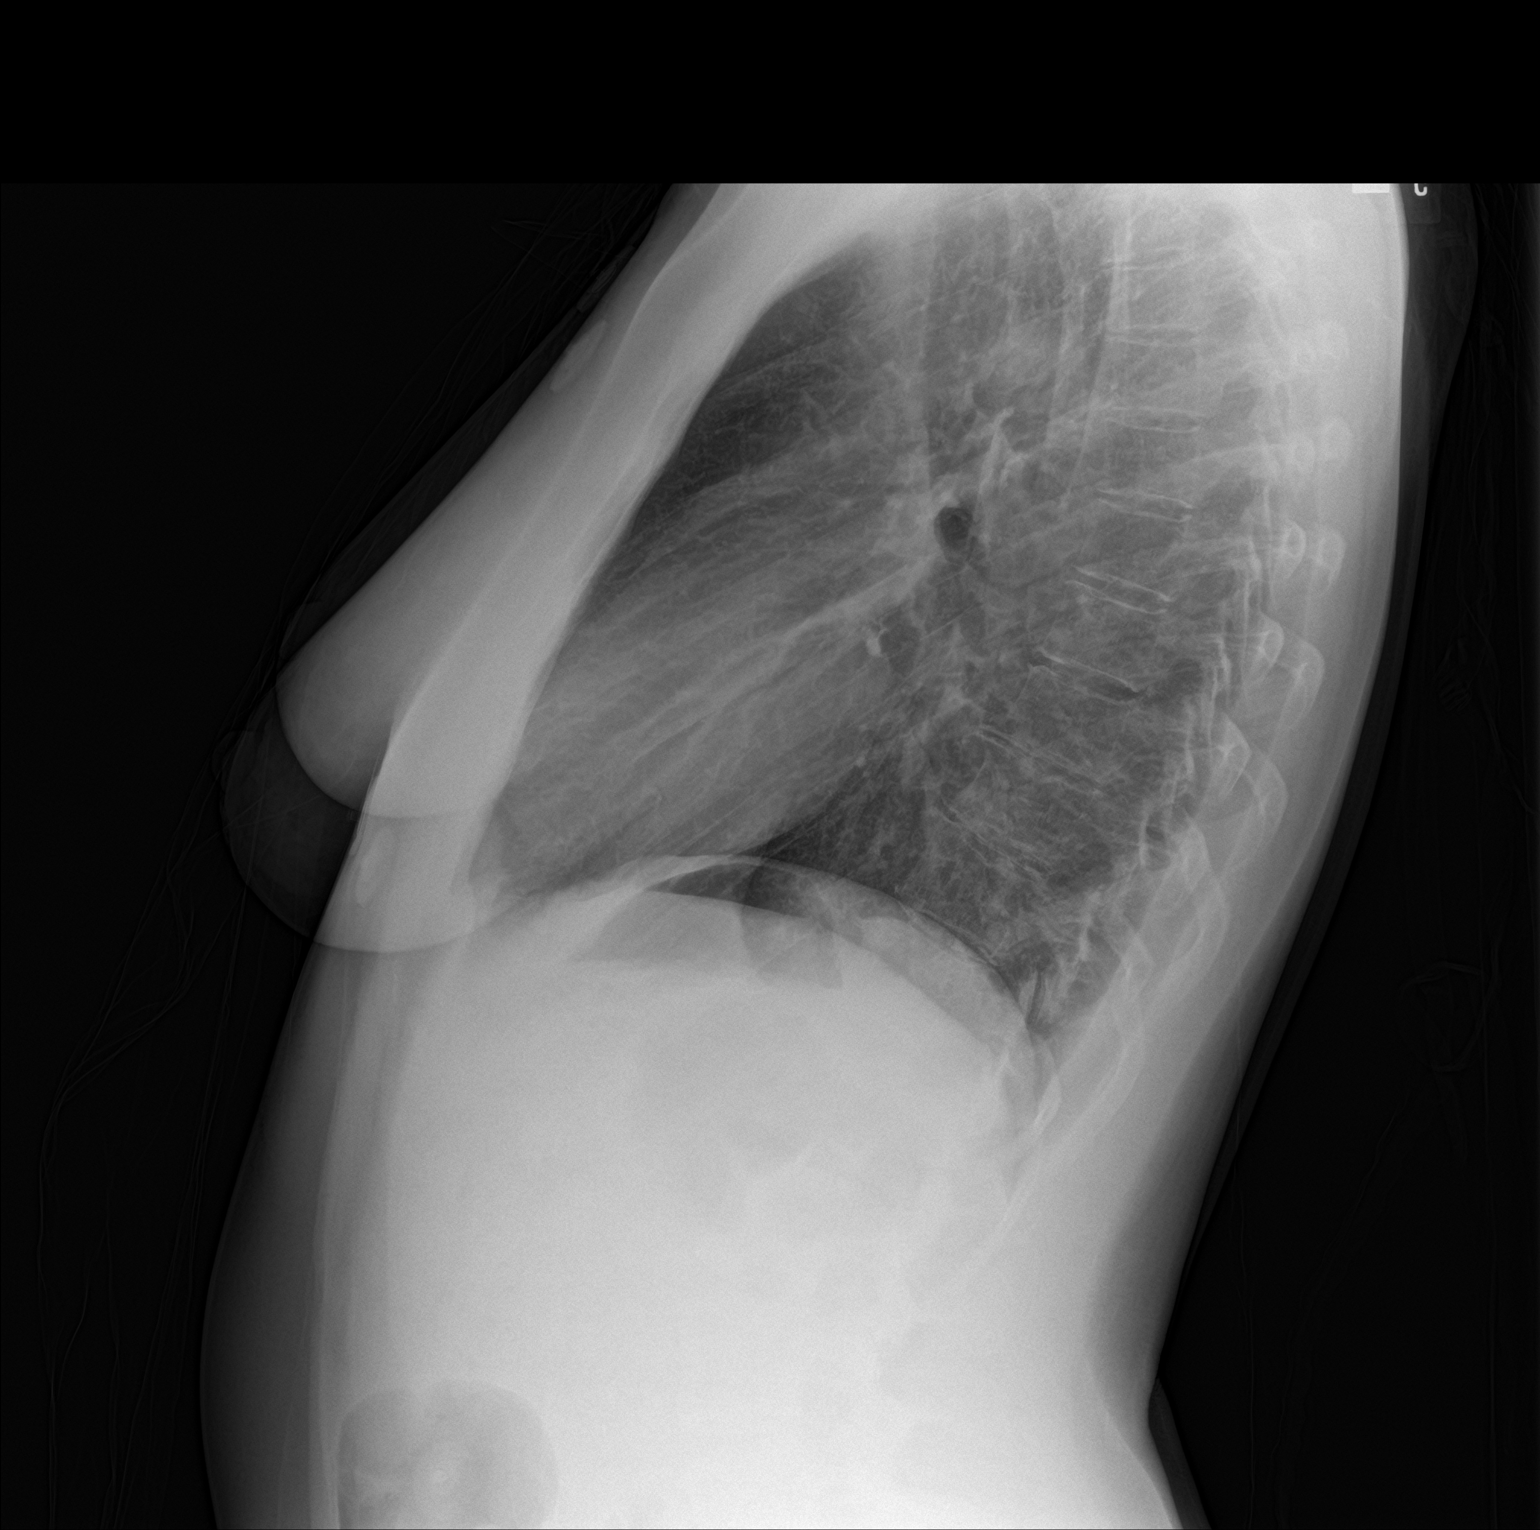

[2 of 2 positions shown; findings below may reference images not displayed]

FINDINGS: The heart size and mediastinal contours are within normal limits.
Both lungs are clear. No pleural effusion or pneumothorax seen. The
visualized skeletal structures are unremarkable.
IMPRESSION: No active cardiopulmonary disease.

## 2022-06-23 HISTORY — PX: OTHER SURGICAL HISTORY: SHX169

## 2022-08-12 ENCOUNTER — Other Ambulatory Visit: Payer: Self-pay

## 2022-08-12 ENCOUNTER — Ambulatory Visit: Payer: 59 | Admitting: Student

## 2022-08-12 ENCOUNTER — Encounter: Payer: Self-pay | Admitting: Student

## 2022-08-12 VITALS — BP 129/69 | HR 95 | Temp 97.4°F | Ht 63.0 in | Wt 131.5 lb

## 2022-08-12 DIAGNOSIS — H538 Other visual disturbances: Secondary | ICD-10-CM

## 2022-08-12 DIAGNOSIS — G8929 Other chronic pain: Secondary | ICD-10-CM

## 2022-08-12 DIAGNOSIS — Z1159 Encounter for screening for other viral diseases: Secondary | ICD-10-CM | POA: Insufficient documentation

## 2022-08-12 DIAGNOSIS — Z1231 Encounter for screening mammogram for malignant neoplasm of breast: Secondary | ICD-10-CM | POA: Insufficient documentation

## 2022-08-12 DIAGNOSIS — R030 Elevated blood-pressure reading, without diagnosis of hypertension: Secondary | ICD-10-CM

## 2022-08-12 DIAGNOSIS — M25572 Pain in left ankle and joints of left foot: Secondary | ICD-10-CM

## 2022-08-12 DIAGNOSIS — Z1211 Encounter for screening for malignant neoplasm of colon: Secondary | ICD-10-CM

## 2022-08-12 DIAGNOSIS — Z131 Encounter for screening for diabetes mellitus: Secondary | ICD-10-CM

## 2022-08-12 DIAGNOSIS — Z23 Encounter for immunization: Secondary | ICD-10-CM

## 2022-08-12 DIAGNOSIS — F411 Generalized anxiety disorder: Secondary | ICD-10-CM

## 2022-08-12 DIAGNOSIS — E785 Hyperlipidemia, unspecified: Secondary | ICD-10-CM

## 2022-08-12 DIAGNOSIS — Z13220 Encounter for screening for lipoid disorders: Secondary | ICD-10-CM

## 2022-08-12 MED ORDER — MELOXICAM 7.5 MG PO TABS: 7.5000 mg | ORAL_TABLET | Freq: Every day | ORAL | 2 refills | Status: AC | PRN

## 2022-08-12 MED ORDER — DICLOFENAC SODIUM 1 % EX GEL: 2.0000 g | Freq: Four times a day (QID) | CUTANEOUS | 3 refills | Status: AC

## 2022-08-12 NOTE — Assessment & Plan Note (Addendum)
Patient endorses 4-year history of significant panic attacks.  Patient states these are random, not associated with anything.  She describes it as a chest tightness, jaw aching, and right arm numbing episode.  She states it lasts about 5 minutes, but feels like hours.  She feels a sense of impending doom.  She denies any syncopal episodes.  She reports she gets shortness of breath at this time as well.  It happens about 3-4 times a month, and has not increased in frequency.  She denies any new stressors, and does not associate anything with this.  She states these are very random.  She states to abort these, she sometimes takes lorazepam which she gets from her mother's medication.  She denies any family history of arrhythmias, or hyperthyroidism.  This is likely panic attacks, will like to rule out tachyarrhythmias as well as thyroid etiology.  Less likely drug-induced.  I do want patient to keep a log about when these happen, to see if this is associated with anything.  I do not think an EKG would help, as these are so far spaced out.  Patient could benefit from a cardiac monitor such as Zio patch or implantable loop recorder.  Plan: -TSH and free T4 pending -Referral to cardiology made -Follow-up in about 1 month -Have patient monitor with journal log  Addendum: Thyroid function tests within normal limits.

## 2022-08-12 NOTE — Assessment & Plan Note (Addendum)
Patient does have a history of hyperlipidemia.  Most recent lipid panel over 4 years ago showing total cholesterol 191, triglycerides 181, LDL 125, HDL 30.  Will repeat studies today.  Patient does state that she will try to make lifestyle changes if numbers have worsened or stayed the same.  Plan: -Obtain lipid panel  Addendum: Lipid panel significantly elevated. Will recommend patient start a high intensity statin. Will need to have some shared decision making as she expressed during our encounter that she did not want to start a statin.   08/13/22 @ 1345: Patient was able to be communicated with via phone call and she stated that she will try to avoid medicine at this time and try lifestyle modifications before a statin. I counseled her on the importance of controlling cholesterol as this can put her at risk for MI as well as stroke. Patient is understanding about this and states that she will try to eat better and try to exercise more.

## 2022-08-12 NOTE — Assessment & Plan Note (Signed)
Updated Tdap.

## 2022-08-12 NOTE — Assessment & Plan Note (Signed)
Referral made for colonoscopy.

## 2022-08-12 NOTE — Assessment & Plan Note (Addendum)
Obtaining hepatitis C screening  Addendum: Negative screen

## 2022-08-12 NOTE — Progress Notes (Addendum)
CC: Anxiety state  HPI:  [name] is a 52 y.o. female with a past medical history of hypertension, ADHD, hyperlipidemia who presents to establish care as well as discuss panic attacks, left ankle pain, and blurry vision.  Of note, patient is a new patient, and more history obtained below.  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 To left ankle, Disp: 4 g, Rfl: 3   meloxicam (MOBIC) 7.5 MG tablet, Take 1 tablet (7.5 mg total) by mouth daily as needed for pain., Disp: 30 tablet, Rfl: 2  Review of Systems:    Constitutional: Patient endorses anxiety attacks Eye: Patient endorses blurry vision MSK: Patient endorses left ankle pain  Physical Exam:  Vitals:   08/12/22 1545  BP: 129/69  Pulse: 95  Temp: (!) 97.4 F (36.3 C)  TempSrc: Oral  SpO2: 100%  Weight: 131 lb 8 oz (59.6 kg)  Height: 5' 3"$  (1.6 m)    General: Patient is sitting comfortably in the room  Eyes: Pupils equal and reactive to light, EOM intact  Head: Normocephalic, atraumatic  Neck: Nontender, supple, no thyroid nodules or goiters appreciated Cardio: Regular rate and rhythm, no murmurs, rubs or gallops. 2+ pulses to bilateral upper and lower extremities  Pulmonary:  Clear to ausculation bilaterally with no rales, rhonchi, and crackles  MSK: 5/5 strength noted to bilateral lower extremities.  There is some tenderness noted to left lateral malleolus, with no obvious deformities or rashes noted.  Great sensation.  2+ pulses.   Assessment & Plan:   Anxiety state Patient endorses 4-year history of significant panic attacks.  Patient states these are random, not associated with anything.  She describes it as a chest tightness, jaw aching, and right arm numbing episode.  She states it lasts about 5 minutes, but feels like hours.  She feels a sense of impending doom.  She denies any syncopal episodes.  She reports she gets shortness of breath at this time as well.  It happens about 3-4 times a month, and has not increased in frequency.  She denies any new stressors, and does not associate anything with this.  She states these are very random.  She states to abort these, she sometimes takes lorazepam which she gets from her mother's medication.  She denies any family history of arrhythmias, or hyperthyroidism.  This is likely panic attacks, will like to rule out tachyarrhythmias as well as thyroid etiology.  Less likely drug-induced.  I do want patient to keep a log about when these happen, to see if this is associated with anything.  I do not think an EKG would help, as these are so far spaced out.  Patient could benefit from  a cardiac monitor such as Zio patch or implantable loop recorder.  Plan: -TSH and free T4 pending -Referral to cardiology made -Follow-up in about 1 month -Have patient monitor with journal log  Blurry vision Patient endorses blurry vision in her right eye.  Patient reports that this suddenly occurred about 7 to 8 months ago, and her right eye never regained proper vision.  Patient does wear corrective lenses.  She denies any trauma to her eye.  Her most recent eye exam was about a year ago, and patient was not told she had glaucoma or cataracts.   Patient has never had ophthalmology exam, which she could benefit from.  This could be related to hypertension, diabetes, or could be macular degeneration.  Plan: -Monitor -Referral to ophthalmology made  Ankle pain, chronic Patient has a history of chronic left ankle pain since 2013.  Patient was told that she has degenerative arthritis.  Did not see imaging suggestive of this on chart review.  Patient does have a history of an I&D due to infection in her left ankle requiring removal of hardware.  Patient has since had this electric/dull pain noted to her left ankle.  She denies any new traumas, fevers, chills, or falls recently.  Patient does report that this pain is about a 5/10 in intensity.  She does occasionally take meloxicam for this pain.  She states that she recently just completed a 5 mile hunger walk, and it aggravated her pain.  On exam, there is no obvious deformities, and there is full active and passive range of motion with some pain noted.  There is no obvious tenderness to palpation.  2+ pedal pulses noted.  Likely this is not a new fracture or any tendinopathy, likely is arthritis.  Do not think imaging would change management at this moment.  Will continue with supportive care.  Plan: -Voltaren gel -Meloxicam as needed -Continue to monitor  Colon cancer screening Referral made for colonoscopy.  Elevated blood pressure reading Patient did have elevated blood pressure reading today of systolics of [id number].  This could be related to smoking, could also be primary hypertension.  Plan: -Obtain BMP  Encounter for screening mammogram for malignant neoplasm of breast Referral made for mammogram.  Need for hepatitis C screening test Obtaining hepatitis C screening  Screening for diabetes mellitus Obtaining diabetes screening.  Hyperlipidemia Patient does have a history of hyperlipidemia.  Most recent lipid panel over 4 years ago showing total cholesterol 191, triglycerides  181, LDL 125, HDL 30.  Will repeat studies today.  Patient does state that she will try to make lifestyle changes if numbers have worsened or stayed the same.  Plan: -Obtain lipid panel  Need for diphtheria-tetanus-pertussis (Tdap) vaccine Updated Tdap.    Patient discussed with Dr. Lottie Mussel  Leigh Aurora, DO PGY-1 Internal Medicine Resident  Pager: [phone]

## 2022-08-12 NOTE — Assessment & Plan Note (Addendum)
Obtaining diabetes screening.  Addendum: -A1c 5.6. Continue with lifestyle changes.

## 2022-08-12 NOTE — Assessment & Plan Note (Signed)
Referral made for mammogram.

## 2022-08-12 NOTE — Assessment & Plan Note (Signed)
Patient did have elevated blood pressure reading today of systolics of Q000111Q.  This could be related to smoking, could also be primary hypertension.  Plan: -Obtain BMP  Addendum: BMP within normal limits. No concerns at this time.

## 2022-08-12 NOTE — Assessment & Plan Note (Signed)
Patient has a history of chronic left ankle pain since 2013.  Patient was told that she has degenerative arthritis.  Did not see imaging suggestive of this on chart review.  Patient does have a history of an I&D due to infection in her left ankle requiring removal of hardware.  Patient has since had this electric/dull pain noted to her left ankle.  She denies any new traumas, fevers, chills, or falls recently.  Patient does report that this pain is about a 5/10 in intensity.  She does occasionally take meloxicam for this pain.  She states that she recently just completed a 5 mile hunger walk, and it aggravated her pain.  On exam, there is no obvious deformities, and there is full active and passive range of motion with some pain noted.  There is no obvious tenderness to palpation.  2+ pedal pulses noted.  Likely this is not a new fracture or any tendinopathy, likely is arthritis.  Do not think imaging would change management at this moment.  Will continue with supportive care.  Plan: -Voltaren gel -Meloxicam as needed -Continue to monitor

## 2022-08-12 NOTE — Assessment & Plan Note (Signed)
Patient endorses blurry vision in her right eye.  Patient reports that this suddenly occurred about 7 to 8 months ago, and her right eye never regained proper vision.  Patient does wear corrective lenses.  She denies any trauma to her eye.  Her most recent eye exam was about a year ago, and patient was not told she had glaucoma or cataracts.  Patient has never had ophthalmology exam, which she could benefit from.  This could be related to hypertension, diabetes, or could be macular degeneration.  Plan: -Monitor -Referral to ophthalmology made

## 2022-08-12 NOTE — Patient Instructions (Addendum)
Cherrie Gauze you for allowing me to take part in your care today.  Here are your instructions.  1.  Regarding her ankle pain, I am going to write you for some meloxicam.  Please take this as needed for very strong pains.  I also will write you for some Voltaren gel.  2.  Regarding your blurry vision, I have referred you to ophthalmology.  Please await phone call.  3.  Regarding your panic attack, I have referred you to cardiology.  Please await phone call.  I have also drawn some labs, please await my phone call for results.  4.  I have also referred you for colonoscopy and mammogram.  Please await phone call for setting this up.    5.  I have given a Tdap vaccine to today, please understand that this can make you slightly sore.  Take Tylenol if needed.  6.  Please follow-up in 1 month, and at that time we can discuss more about your lab results, as well as address any other concerns that you may have.  7.  Please call us if you need any other help.   Thank you, Dr. Posey Pronto  If you have any other questions please contact the internal medicine clinic at [phone]

## 2022-08-13 ENCOUNTER — Encounter: Payer: Self-pay | Admitting: Student

## 2022-08-13 LAB — LIPID PANEL
Chol/HDL Ratio: 7.5 ratio — ABNORMAL HIGH (ref 0.0–4.4)
Cholesterol, Total: 284 mg/dL — ABNORMAL HIGH (ref 100–199)
HDL: 38 mg/dL — ABNORMAL LOW (ref >39)
LDL Chol Calc (NIH): 211 mg/dL — ABNORMAL HIGH (ref 0–99)
Triglycerides: 180 mg/dL — ABNORMAL HIGH (ref 0–149)
VLDL Cholesterol Cal: 35 mg/dL (ref 5–40)

## 2022-08-13 LAB — BMP8+ANION GAP
Anion Gap: 18 mmol/L (ref 10.0–18.0)
BUN/Creatinine Ratio: 13 (ref 9–23)
BUN: 10 mg/dL (ref 6–24)
CO2: 20 mmol/L (ref 20–29)
Calcium: 9.5 mg/dL (ref 8.7–10.2)
Chloride: 100 mmol/L (ref 96–106)
Creatinine, Ser: 0.77 mg/dL (ref 0.57–1.00)
Glucose: 123 mg/dL — ABNORMAL HIGH (ref 70–99)
Potassium: 3.8 mmol/L (ref 3.5–5.2)
Sodium: 138 mmol/L (ref 134–144)
eGFR: 94 mL/min/{1.73_m2} (ref >59)

## 2022-08-13 LAB — TSH+FREE T4
Free T4: 1.2 ng/dL (ref 0.82–1.77)
TSH: 1.57 u[IU]/mL (ref 0.450–4.500)

## 2022-08-13 LAB — HCV AB W REFLEX TO QUANT PCR: HCV Ab: NONREACTIVE

## 2022-08-13 LAB — HEMOGLOBIN A1C
Est. average glucose Bld gHb Est-mCnc: 114 mg/dL
Hgb A1c MFr Bld: 5.6 % (ref 4.8–5.6)

## 2022-08-13 LAB — HCV INTERPRETATION

## 2022-08-13 NOTE — Progress Notes (Signed)
Internal Medicine Clinic Attending  Case discussed with Dr. Posey Pronto  At the time of the visit.  We reviewed the resident's history and exam and pertinent patient test results.  I agree with the assessment, diagnosis, and plan of care documented in the resident's note.     Ddx for patient's episodes of palpitations includes panic attacks vs arrhythmia. I think it will be helpful for her to keep a log of potential triggers (alcohol? Stress?) and set her up for cardiac monitoring.

## 2022-08-13 NOTE — Progress Notes (Signed)
Patient lipid panel significantly elevated. Patient could benefit from starting a high intensity statin with LDL greater than 190 and total cholesterol 284. Kidney function and electrolytes within normal limits. Thyroid studies within normal limits as well. A1c is 5.6 so patient is not diabetic. Hep C screening was negative. Will recommend patient get on a high intensity statin.

## 2022-08-15 ENCOUNTER — Encounter: Payer: Self-pay | Admitting: Student

## 2022-08-21 ENCOUNTER — Encounter: Payer: Self-pay | Admitting: Student

## 2022-08-22 ENCOUNTER — Ambulatory Visit: Payer: 59 | Admitting: Student

## 2022-08-22 ENCOUNTER — Other Ambulatory Visit: Payer: Self-pay

## 2022-08-22 ENCOUNTER — Encounter: Payer: Self-pay | Admitting: Student

## 2022-08-22 VITALS — BP 116/70 | HR 92 | Temp 97.7°F | Ht 63.0 in | Wt 136.4 lb

## 2022-08-22 DIAGNOSIS — T7840XA Allergy, unspecified, initial encounter: Secondary | ICD-10-CM | POA: Diagnosis not present

## 2022-08-22 NOTE — Patient Instructions (Signed)
Ms.Thula L Maddux, it was a pleasure seeing you today!  Today we discussed: - I think you had an allergic reaction to the Upnourish medication. I would not taking anything further that contains any shellfish products. Please follow-up with Dr. Posey Pronto as you discussed at your last visit.   Follow-up:  as needed    Please make sure to arrive 15 minutes prior to your next appointment. If you arrive late, you may be asked to reschedule.   We look forward to seeing you next time. Please call our clinic at [phone] if you have any questions or concerns. The best time to call is Monday-Friday from 9am-4pm, but there is someone available 24/7. If after hours or the weekend, call the main hospital number and ask for the Internal Medicine Resident On-Call. If you need medication refills, please notify your pharmacy one week in advance and they will send [place] a request.  Thank you for letting us take part in your care. Wishing you the best!  Thank you, Sanjuan Dame, MD

## 2022-08-22 NOTE — Progress Notes (Signed)
   CC: facial edema  HPI:  Brianna Bowman is a 52 y.o. person with medical history as below presenting to [facility] for facial edema.  Please see problem-based list for further details, assessments, and plans.  Past Medical History:  Diagnosis Date   Anemia    with pregnancy   Anxiety    due to condition   Depression    Headache(784.0)    history of   PONV (postoperative nausea and vomiting)    with epidural anad 04/28/13 BP low and took longer to wake after surgery 04/28/2012   Review of Systems:  As per HPI  Physical Exam:  Vitals:   08/22/22 0900  BP: 116/70  Pulse: 92  Temp: 97.7 F (36.5 C)  TempSrc: Oral  SpO2: 100%  Weight: 136 lb 6.4 oz (61.9 kg)  Height: '5\' 3"'$  (1.6 m)   General: Resting comfortably in no acute distress HENT: Normocephalic, atraumatic. No edema in cheeks, eyelids, or lips appreciated. No erythema, hives, or raised areas.  CV: Regular rate, rhythm. No murmurs appreciated. Pulm: Normal work of breathing on room air. Clear to auscultation bilaterally.  Neuro: Awake, alert, conversing appropriately.  Psych: Normal mood, affect, speech.   Assessment & Plan:   Allergic reaction Patient is presenting for three days of facial edema. She reports prior to this she started taking an over the counter supplement to help with her cholesterol. In addition to this pill she has also been taking fish oil. She does eat fish frequently, but does report previous time where she had a mild allergic reaction to lobster. She describes "puffiness" throughout her face and mild erythematous rash on her face, but denies any itching, pain, difficulties breathing. She trialed Benadryl and over the counter steroid cream, but these did not help. She reports her symptoms have now resolved. Upon inspection of the over the counter supplement, it appears this does have shellfish extract in it. Discussed with Brianna Bowman that this is likely the culprit.  Discussed with her to avoid  shellfish containing products in the future.   - Avoid shellfish & extracts - Return precautions given   Patient discussed with Dr.  Maurilio Lovely, MD Internal Medicine PGY-3 Pager: [phone]

## 2022-08-22 NOTE — Assessment & Plan Note (Signed)
Patient is presenting for three days of facial edema. She reports prior to this she started taking an over the counter supplement to help with her cholesterol. In addition to this pill she has also been taking fish oil. She does eat fish frequently, but does report previous time where she had a mild allergic reaction to lobster. She describes "puffiness" throughout her face and mild erythematous rash on her face, but denies any itching, pain, difficulties breathing. She trialed Benadryl and over the counter steroid cream, but these did not help. She reports her symptoms have now resolved. Upon inspection of the over the counter supplement, it appears this does have shellfish extract in it. Discussed with Brianna Bowman that this is likely the culprit.  Discussed with her to avoid shellfish containing products in the future.   - Avoid shellfish & extracts - Return precautions given

## 2022-08-23 ENCOUNTER — Other Ambulatory Visit: Payer: Self-pay

## 2022-08-23 ENCOUNTER — Emergency Department (HOSPITAL_COMMUNITY): Payer: 59

## 2022-08-23 ENCOUNTER — Encounter (HOSPITAL_COMMUNITY): Payer: Self-pay | Admitting: *Deleted

## 2022-08-23 ENCOUNTER — Emergency Department (HOSPITAL_COMMUNITY)
Admission: EM | Admit: 2022-08-23 | Discharge: 2022-08-23 | Disposition: A | Discharge status: Home / Self Care | Payer: 59 | Attending: Emergency Medicine | Admitting: Emergency Medicine

## 2022-08-23 DIAGNOSIS — S8991XA Unspecified injury of right lower leg, initial encounter: Secondary | ICD-10-CM | POA: Diagnosis present

## 2022-08-23 DIAGNOSIS — S82851A Displaced trimalleolar fracture of right lower leg, initial encounter for closed fracture: Secondary | ICD-10-CM | POA: Diagnosis not present

## 2022-08-23 DIAGNOSIS — X501XXA Overexertion from prolonged static or awkward postures, initial encounter: Secondary | ICD-10-CM | POA: Insufficient documentation

## 2022-08-23 DIAGNOSIS — Y9389 Activity, other specified: Secondary | ICD-10-CM | POA: Diagnosis not present

## 2022-08-23 MED ORDER — OXYCODONE-ACETAMINOPHEN 5-325 MG PO TABS
1.0000 | ORAL_TABLET | Freq: Once | ORAL | Status: AC
  Administered 2022-08-23: 1 via ORAL
  Filled 2022-08-23: qty 1

## 2022-08-23 MED ORDER — FENTANYL CITRATE PF 50 MCG/ML IJ SOSY
50.0000 ug | PREFILLED_SYRINGE | Freq: Once | INTRAMUSCULAR | Status: AC
  Administered 2022-08-23: 50 ug via INTRAVENOUS
  Filled 2022-08-23: qty 1

## 2022-08-23 MED ORDER — ONDANSETRON HCL 4 MG/2ML IJ SOLN: 4.0000 mg | Freq: Once | INTRAMUSCULAR | Status: AC

## 2022-08-23 MED ORDER — ETOMIDATE 2 MG/ML IV SOLN
0.1000 mg/kg | Freq: Once | INTRAVENOUS | Status: AC
  Administered 2022-08-23: 5 mg via INTRAVENOUS
  Filled 2022-08-23: qty 10

## 2022-08-23 MED ORDER — ONDANSETRON HCL 4 MG/2ML IJ SOLN
INTRAMUSCULAR | Status: AC
  Administered 2022-08-23: 4 mg via INTRAVENOUS
  Filled 2022-08-23: qty 2

## 2022-08-23 MED ORDER — FENTANYL CITRATE (PF) 100 MCG/2ML IJ SOLN
INTRAMUSCULAR | Status: AC | PRN
  Administered 2022-08-23: 50 ug via INTRAVENOUS

## 2022-08-23 MED ORDER — FENTANYL CITRATE PF 50 MCG/ML IJ SOSY
PREFILLED_SYRINGE | INTRAMUSCULAR | Status: AC
  Filled 2022-08-23: qty 1

## 2022-08-23 MED ORDER — OXYCODONE-ACETAMINOPHEN 5-325 MG PO TABS: 1.0000 | ORAL_TABLET | Freq: Four times a day (QID) | ORAL | 0 refills | Status: DC | PRN

## 2022-08-23 MED ORDER — SODIUM CHLORIDE 0.9 % IV BOLUS
1000.0000 mL | Freq: Once | INTRAVENOUS | Status: AC
  Administered 2022-08-23: 1000 mL via INTRAVENOUS

## 2022-08-23 NOTE — Progress Notes (Signed)
Orthopedic Tech Progress Note Patient Details:  Brianna Bowman [birth year] [id number]  Ortho Devices Type of Ortho Device: Post (short leg) splint, Stirrup splint Ortho Device/Splint Location: rle Ortho Device/Splint Interventions: Ordered, Application, Adjustment  I applied splint post reduction with drs assist. Patient was in recliner. Post Interventions Patient Tolerated: Well Instructions Provided: Care of device, Adjustment of device  Karolee Stamps 08/23/2022, 7:46 PM

## 2022-08-23 NOTE — Progress Notes (Signed)
Orthopedic Tech Progress Note Patient Details:  Brianna Bowman [birth year] [id number]  Ortho Devices Type of Ortho Device: Crutches Ortho Device/Splint Location: rle Ortho Device/Splint Interventions: Ordered, Application, Adjustment   Post Interventions Patient Tolerated: Well Instructions Provided: Care of device, Adjustment of device  Karolee Stamps 08/23/2022, 11:29 PM

## 2022-08-23 NOTE — ED Provider Notes (Signed)
[facility] Provider Note   CSN: [id number] Arrival date & time: 08/23/22  1623     History  Chief Complaint  Patient presents with   Ankle Pain    Brianna Bowman is a 52 y.o. female.  The history is provided by the patient and medical records. No language interpreter was used.  Ankle Pain    52 year old female significant history of anxiety, depression, anemia, headache, chronic ankle pain presenting for evaluation of right ankle injury.  Patient reports today she was at the playground going on a slide with her son.  He stopped abruptly and she tried to slow down but in the process her right ankle got caught on the ledge and pulled back.  She felt a pop follows with pain about the ankle.  States she has to shift ankle back in place but unable to bear weight afterward.  She is now complaining of throbbing moderate pain about her ankle with slight tingling sensation.  She denies any other injury no foot pain no knee pain or hip pain.  She denies any prior injury to the same ankle but has broken her left ankle many years ago.  She did receive her Percocet prior to arrival which did not provide adequate relief.  Home Medications Prior to Admission medications   Medication Sig Start Date End Date Taking? Authorizing Provider  diclofenac Sodium (VOLTAREN ARTHRITIS PAIN) 1 % GEL Apply 2 g topically 4 (four) times daily. To left ankle 08/12/22   Leigh Aurora, DO  meloxicam (MOBIC) 7.5 MG tablet Take 1 tablet (7.5 mg total) by mouth daily as needed for pain. 08/12/22 11/10/22  Leigh Aurora, DO      Allergies    Amoxicillin, Adhesive [tape], Aloe, and Shellfish allergy    Review of Systems   Review of Systems  All other systems reviewed and are negative.   Physical Exam Updated Vital Signs BP 93/74 (BP Location: Left Arm)   Pulse (!) 105   Temp 98.2 F (36.8 C) (Oral)   Resp 20   Ht '5\' 3"'$  (1.6 m)   Wt 61.9 kg   LMP 08/23/2022   SpO2 97%    BMI 24.17 kg/m  Physical Exam Vitals and nursing note reviewed.  Constitutional:      General: She is not in acute distress.    Appearance: She is well-developed.  HENT:     Head: Atraumatic.  Eyes:     Conjunctiva/sclera: Conjunctivae normal.  Pulmonary:     Effort: Pulmonary effort is normal.  Musculoskeletal:        General: Tenderness (Right ankle: Ankle is in a Sam splint.  Tenderness noted to both medial and lateral mild lateral region with obvious closed deformity and medial skin tenting.  Decreased ROM secondary to pain.  Intact distal pedal pulses.  Brisk cap refill to toes) present.     Cervical back: Neck supple.  Skin:    Findings: No rash.  Neurological:     Mental Status: She is alert.  Psychiatric:        Mood and Affect: Mood normal.     ED Results / Procedures / Treatments   Labs (all labs ordered are listed, but only abnormal results are displayed) Labs Reviewed - No data to display  EKG None  Radiology DG Ankle Complete Right  Result Date: 08/23/2022 CLINICAL DATA:  Postreduction EXAM: RIGHT ANKLE - COMPLETE 3 VIEW COMPARISON:  X-ray earlier 08/23/2022 FINDINGS: Postreduction of dislocation  of the ankle. There is also significantly improved alignment of the trimalleolar fractures. Overlapping cast in place obscuring underlying bone detail. Imaging was obtained to aid in treatment IMPRESSION: Postreduction Electronically Signed   By: Jill Side M.D.   On: 08/23/2022 19:50   DG Ankle Complete Right  Result Date: 08/23/2022 CLINICAL DATA:  Pain after injury EXAM: RIGHT ANKLE - COMPLETE 3 VIEW COMPARISON:  None Available. FINDINGS: Fracture/dislocation noted. There is a oblique fracture of the distal fibular metaphysis, transverse fracture of the medial malleolus and a posterior distal tibial fracture. Fracture fragments are displaced. Preserved bone mineralization. Overlapping bandages. IMPRESSION: Fracture/dislocation of the right ankle as described above.  Trimalleolar fracture as well. Electronically Signed   By: Jill Side M.D.   On: 08/23/2022 17:53    Procedures .Ortho Injury Treatment  Date/Time: 08/23/2022 7:48 PM  Performed by: Domenic Moras, PA-C Authorized by: Domenic Moras, PA-C   Consent:    Consent obtained:  Written   Consent given by:  Patient   Risks discussed:  Irreducible dislocation, recurrent dislocation, restricted joint movement, nerve damage and fracture   Alternatives discussed:  Alternative treatmentInjury location: ankle Location details: right ankle Injury type: fracture-dislocation Fracture type: trimalleolar Pre-procedure neurovascular assessment: neurovascularly intact Pre-procedure distal perfusion: normal Pre-procedure neurological function: normal Pre-procedure range of motion: reduced  Patient sedated: Yes. Refer to sedation procedure documentation for details of sedation. Manipulation performed: yes Reduction successful: yes X-ray confirmed reduction: yes Immobilization: splint Splint type: short leg Splint Applied by: Ortho Tech Supplies used: Ortho-Glass Post-procedure neurovascular assessment: post-procedure neurovascularly intact Post-procedure distal perfusion: normal Post-procedure neurological function: normal Post-procedure range of motion: improved   .Critical Care  Performed by: Domenic Moras, PA-C Authorized by: Domenic Moras, PA-C   Critical care provider statement:    Critical care time (minutes):  30   Critical care was time spent personally by me on the following activities:  Development of treatment plan with patient or surrogate, discussions with consultants, evaluation of patient's response to treatment, examination of patient, ordering and review of laboratory studies, ordering and review of radiographic studies, ordering and performing treatments and interventions, pulse oximetry, re-evaluation of patient's condition and review of old charts     Medications Ordered in  ED Medications  fentaNYL (SUBLIMAZE) 50 MCG/ML injection (has no administration in time range)  oxyCODONE-acetaminophen (PERCOCET/ROXICET) 5-325 MG per tablet 1 tablet (1 tablet Oral Given 08/23/22 1643)  oxyCODONE-acetaminophen (PERCOCET/ROXICET) 5-325 MG per tablet 1 tablet (1 tablet Oral Given 08/23/22 1805)  sodium chloride 0.9 % bolus 1,000 mL (0 mLs Intravenous Stopped 08/23/22 2047)  etomidate (AMIDATE) injection 6.2 mg (5 mg Intravenous Given 08/23/22 1930)  fentaNYL (SUBLIMAZE) injection 50 mcg (50 mcg Intravenous Given 08/23/22 1925)  ondansetron (ZOFRAN) injection 4 mg (4 mg Intravenous Given 08/23/22 1929)  fentaNYL (SUBLIMAZE) injection (50 mcg Intravenous Given 08/23/22 1936)    ED Course/ Medical Decision Making/ A&P                             Medical Decision Making Amount and/or Complexity of Data Reviewed Radiology: ordered.  Risk Prescription drug management.   BP 93/74 (BP Location: Left Arm)   Pulse (!) 105   Temp 98.2 F (36.8 C) (Oral)   Resp 20   Ht '5\' 3"'$  (1.6 m)   Wt 61.9 kg   LMP 08/23/2022   SpO2 97%   BMI 24.17 kg/m   68:36 PM   52 year old female significant  history of anxiety, depression, anemia, headache, chronic ankle pain presenting for evaluation of right ankle injury.  Patient reports today she was at the playground going on a slide with her son.  He stopped abruptly and she tried to slow down but in the process her right ankle got caught on the ledge and pulled back.  She felt a pop follows with pain about the ankle.  States she has to shift ankle back in place but unable to bear weight afterward.  She is now complaining of throbbing moderate pain about her ankle with slight tingling sensation.  She denies any other injury no foot pain no knee pain or hip pain.  She denies any prior injury to the same ankle but has broken her left ankle many years ago.  She did receive her Percocet prior to arrival which did not provide adequate relief.  On exam patient  is resting comfortably appears to be in no acute discomfort.  Right ankle is in a Sam splint.  She does have tenderness to both medial and lateral region with obvious closed deformity and crepitus noted.  She has intact distal pedal pulse with brisk cap refill.  Her knee and hip are nontender.  6:21 PM X-ray of right ankle obtained independently viewed interpreted by me and I agree with radiologist interpretation.  X-ray demonstrate evidence of a trimalleolar fracture.  This is a closed injury and patient is neurovascular intact.  She will need to have ankle reduced.  At this time current vital signs include a blood pressure of 93/74, heart rate of 105.  Will give IV fluid, will perform procedural sedation with etomidate 0.1 mg/kg.  Additional pain medication given.  Patient's last meal was yesterday.  Appreciate consultation from orthopedist on-call Dr. Laurance Flatten who requested for ankle to be reduced and placed in a short leg splint.  After reduction, depending on the improvement and patient's preference, patient can follow-up outpatient or be admitted for surgical repair tomorrow.  8:54 PM Successful reduction of right ankle.  Patient's exhibits intact sensation and pulses status post splint placement.  Orthopedist Dr. Laurance Flatten did see and evaluate patient and gave patient option of being admitted for surgical repair tomorrow or patient can follow-up outpatient tomorrow for further management.  Patient's preference is to go home with close follow-up.  Will provide crutches, recommend nonweightbearing, will prescribe opiate pain medication and gave return precaution.  Patient voiced understanding and agrees with plan.        Final Clinical Impression(s) / ED Diagnoses Final diagnoses:  Closed displaced trimalleolar fracture of lower leg, right, initial encounter    Rx / DC Orders ED Discharge Orders          Ordered    oxyCODONE-acetaminophen (PERCOCET) 5-325 MG tablet  Every 6 hours PRN         08/23/22 2100              Domenic Moras, PA-C 08/23/22 2100    Godfrey Pick, MD 08/24/22 1620

## 2022-08-23 NOTE — H&P (Signed)
Orthopedic Surgery H&P Note  Assessment: Patient is a 52 y.o. female with right, closed ankle fracture/dislocation   Plan: -Will need operative fixation, patient did not want to stay overnight so will plan to do on outpatient basis. Discussed that that she needs to return to do it as an inpatient if she feels her ankle pop, has worsening pain, develops numbness or tingling in the foot/ankle -Okay for diet from ortho perspective -DVT ppx: aspirin '81mg'$  BID -Antibiotics: none, will need clindamycin prior to incision -Weight bearing status: NWB RLE in splint -Pain control -Will have surgery scheduler call her to discuss date/time of surgery   Discussed recommendation for operative intervention in the form of right ankle fracture ORIF. Explained the risks of this procedure included, but were not limited to: nonunion, malunion, hardware failure, infection, bleeding, stiffness, hardware malposition, need for additional procedures, deep vein thrombosis, pulmonary embolism, and death. I explained that her risks would be higher, particularly for infection, wound dehiscence, and nonunion because of her smoking status. The benefits of this procedure would be to promote fracture healing by providing stability and to heal the fracture in the appropriate alignment. The alternatives of this surgery would be to treat the fracture with immobilization in a splint/brace/cast or to do no intervention. The patient's questions were answered to her satisfaction. After this discussion, patient elected to proceed with surgery. Informed consent was obtained.   ___________________________________________________________________________   Chief complaint: right ankle pain  History:  Patient is a 52 y.o. female who was going down a slide on playground equipment when she tried to use her foot to slow down but it got caught and she felt her ankle pop. Noted acute onset of right ankle pain. No pain elsewhere. She was taken  to the emergency department where she was diagnosed with a right ankle fracture/dislocation. A reduction was performed. She states she is still having significant ankle pain. No pain in any other extremity. Denies paresthesias and numbness.   Review of systems: General: denies fevers and chills, myalgias Neurologic: denies recent changes in vision, slurred speech Abdomen: denies nausea, vomiting, hematemesis Respiratory: denies cough, shortness of breath  Past medical history:  Anxiety Depression HLD  Allergies: amoxicillin, adhesive tape, shellfish   Past surgical history:  Left ankle fracture ORIF complicated by infection that led to hardware removal Left femur fracture ORIF Mandible fracture ORIF   Social history: Reports use of nicotine-containing products (cigarettes, vaping, smokeless, etc.) - actively smokes Alcohol use: rare Denies use of recreational drugs  Family history: -reviewed and not pertinent to ankle fracture   Physical Exam:  General: no acute distress, appears stated age Neurologic: alert, answering questions appropriately, following commands Cardiovascular: regular rate, no cyanosis Respiratory: unlabored breathing on room air, symmetric chest rise Psychiatric: appropriate affect, normal cadence to speech  MSK:   -Bilateral upper extremities  No tenderness to palpation over extremity, no gross deformity Fires deltoid, biceps, triceps, wrist extensors, wrist flexors, finger extensors, finger flexors  AIN/PIN/IO intact  Palpable radial pulse  Sensation intact to light touch in median/ulnar/radial/axillary nerve distributions  Hand warm and well perfused  -Left lower extremity  No tenderness to palpation over extremity, no gross deformity Fires hip flexors, quadriceps, hamstrings, tibialis anterior, gastrocnemius and soleus, extensor hallucis longus Plantarflexes and dorsiflexes toes Sensation intact to light touch in sural, saphenous, tibial, deep  peroneal, and superficial peroneal nerve distributions Foot warm and well perfused, palpable DP pulse  -Right lower extremity  No tenderness to palpation over extremity, except  over the foot and ankle, no open wounds Fires hip flexors, quadriceps, hamstrings, extensor hallucis longus. Does not fire tibialis anterior, gastrocnemius, or soleus due to pain Plantarflexes and dorsiflexes toes Sensation intact to light touch in sural, saphenous, tibial, deep peroneal, and superficial peroneal nerve distributions Foot warm and well perfused, palpable DP pulse  Imaging: XR of the right ankle from 08/23/2022 was independently reviewed and interpreted, showing a spiral, comminuted lateral malleolus fracture and a transverse medial malleolus fracture. The ankle is dislocated laterally. There was a subsequent film that showed reduction and splint placement with the talus underneath the tibia. Fractures with improved alignment.    Patient name: Brianna Bowman Patient MRN: [id number] Date: 08/23/22

## 2022-08-23 NOTE — Discharge Instructions (Addendum)
You have been evaluated for your ankle injury.  Please call and follow-up closely with orthopedics provider tomorrow for surgical management of your injury.  Do not bear any weight, take pain medication as needed, return if you have concern.

## 2022-08-23 NOTE — ED Triage Notes (Signed)
The pt injured her rt ankle sliding down a slide approx 30 minutes ago  she reports that the ankle was in an akward position afterward and she had to push it back staright  good pulse  lmp now

## 2022-08-23 NOTE — ED Notes (Signed)
Ice elevate

## 2022-08-23 NOTE — Progress Notes (Signed)
Pre-operative Scores  VAS leg - 9 SF-36:  -Physical functioning: 0  -Role limitations due to physical health: 0  -Role limitations due to emotional problems: 33.3  -Energy/fatigue: 35  -Emotional well-being: 80  -Social functioning: 25  -Pain: 0  -General health: [place], MD Orthopedic Surgeon

## 2022-08-23 NOTE — ED Notes (Signed)
Pt moved to 26 for sedation for reduction. RN, Hassan Rowan assumed care of patient. And will return to hallway 21 upon return to [facility].

## 2022-08-24 NOTE — ED Provider Notes (Signed)
.  Sedation  Date/Time: 08/23/2022 8:00 PM  Performed by: Godfrey Pick, MD Authorized by: Godfrey Pick, MD   Consent:    Consent obtained:  Verbal and written   Consent given by:  Patient   Risks discussed:  Allergic reaction, prolonged hypoxia resulting in organ damage, inadequate sedation, respiratory compromise necessitating ventilatory assistance and intubation, nausea and vomiting Universal protocol:    Immediately prior to procedure, a time out was called: yes     Patient identity confirmed:  Verbally with patient and arm band Indications:    Procedure performed:  Dislocation reduction   Procedure necessitating sedation performed by:  Different physician Pre-sedation assessment:    Time since last food or drink:  3 hours   ASA classification: class 2 - patient with mild systemic disease     Mouth opening:  3 or more finger widths   Thyromental distance:  3 finger widths   Mallampati score:  II - soft palate, uvula, fauces visible   Neck mobility: normal     Pre-sedation assessments completed and reviewed: airway patency, cardiovascular function, hydration status, mental status, nausea/vomiting, pain level and respiratory function     Pre-sedation assessment completed:  08/23/2022 7:30 PM Immediate pre-procedure details:    Reassessment: Patient reassessed immediately prior to procedure     Reviewed: vital signs     Verified: bag valve mask available, emergency equipment available, intubation equipment available, IV patency confirmed and oxygen available   Procedure details (see MAR for exact dosages):    Preoxygenation:  Room air   Sedation:  Etomidate   Intended level of sedation: moderate (conscious sedation)   Analgesia:  Fentanyl   Intra-procedure monitoring:  Cardiac monitor, blood pressure monitoring, continuous capnometry, continuous pulse oximetry, frequent LOC assessments and frequent vital sign checks   Intra-procedure events: none     Total Provider sedation time  (minutes):  10 Post-procedure details:    Post-sedation assessment completed:  08/23/2022 7:50 PM   Attendance: Constant attendance by certified staff until patient recovered     Recovery: Patient returned to pre-procedure baseline     Post-sedation assessments completed and reviewed: airway patency, cardiovascular function, hydration status, mental status, nausea/vomiting, pain level and respiratory function     Patient is stable for discharge or admission: yes     Procedure completion:  Tolerated well, no immediate complications     Godfrey Pick, MD 08/24/22 [phone]

## 2022-08-25 ENCOUNTER — Telehealth: Payer: Self-pay | Admitting: Orthopedic Surgery

## 2022-08-25 MED ORDER — OXYCODONE HCL 5 MG PO TABS: 5.0000 mg | ORAL_TABLET | ORAL | 0 refills | Status: AC | PRN

## 2022-08-25 NOTE — Telephone Encounter (Signed)
Patient has been scheduled for surgery on Friday 08/29/22. Patient was only given 10 Oxycodone 5/325, and will run out before surgery. Patient is requesting a refill to be sent to [facility] on [address] in [place]. Please advise.

## 2022-08-25 NOTE — Progress Notes (Signed)
Internal Medicine Clinic Attending ? ?Case discussed with Dr. Braswell  At the time of the visit.  We reviewed the resident?s history and exam and pertinent patient test results.  I agree with the assessment, diagnosis, and plan of care documented in the resident?s note.  ?

## 2022-08-25 NOTE — Telephone Encounter (Signed)
Patient's spouse left a message stating they were to get a call first thing this morning to schedule patients surgery. Is patient suppose to be scheduling surgery, or an appointment to be seen here for follow up of ankle fracture? Please advise.

## 2022-08-25 NOTE — Telephone Encounter (Signed)
I called and advised that Dr. Laurance Flatten sent in pain meds to her pharmacy

## 2022-08-27 ENCOUNTER — Telehealth: Payer: Self-pay | Admitting: Orthopedic Surgery

## 2022-08-27 NOTE — Telephone Encounter (Signed)
Received call from [place] with [facility] needing orders put in. She advised patient is having surgery Friday August 29, 2022.

## 2022-08-27 NOTE — Pre-Procedure Instructions (Addendum)
Surgical Instructions    Your procedure is scheduled on Friday, August 29, 2022.  Report to [facility] Main Entrance "A" at 1:45 p.m., then check in with the Admitting office.  Call this number if you have problems the morning of surgery:  [phone]   If you have any questions prior to your surgery date call [phone]: Open Monday-Friday 8am-4pm If you experience any cold or flu symptoms such as cough, fever, chills, shortness of breath, etc. between now and your scheduled surgery, please notify us at the above number     Remember:  Do not eat after midnight the night before your surgery  You may drink clear liquids until 12:45 pm the morning of your surgery.   Clear liquids allowed are: Water, Non-Citrus Juices (without pulp), Carbonated Beverages, Clear Tea, Black Coffee ONLY (NO MILK, CREAM OR POWDERED CREAMER of any kind), and Gatorade   Enhanced Recovery after Surgery for Orthopedics Enhanced Recovery after Surgery is a protocol used to improve the stress on your body and your recovery after surgery.  Patient Instructions  The day of surgery (if you do NOT have diabetes):  Drink ONE (1) Pre-Surgery Clear Ensure by  12:45 pm, the day of surgery   This drink was given to you during your hospital  pre-op appointment visit. Nothing else to drink after completing the  Pre-Surgery Clear Ensure.          If you have questions, please contact your surgeon's office.     Take these medicines the morning of surgery with A SIP OF WATER:   oxyCODONE (ROXICODONE) -if needed     As of today, STOP taking any Aspirin (unless otherwise instructed by your surgeon) Aleve, Naproxen, Ibuprofen, Motrin, Advil, Goody's, BC's, all herbal medications, fish oil, and all vitamins, meloxicam (MOBIC) and diclofenac Sodium (VOLTAREN ARTHRITIS PAIN) 1 % GEL   Follow your surgeon's instructions on when to stop Aspirin.  If no instructions were given by your surgeon then you will need to call the  office to get those instructions.             Do not wear jewelry or makeup. Do not wear lotions, powders, perfumes or deodorant. Do not shave 48 hours prior to surgery.  Do not bring valuables to the hospital. Do not wear nail polish, gel polish, artificial nails, or any other type of covering on natural nails (fingers and toes) If you have artificial nails or gel coating that need to be removed by a nail salon, please have this removed prior to surgery. Artificial nails or gel coating may interfere with anesthesia's ability to adequately monitor your vital signs.  [facility] is not responsible for any belongings or valuables.    Do NOT Smoke (Tobacco/Vaping)  24 hours prior to your procedure  If you use a CPAP at night, you may bring your mask for your overnight stay.   Contacts, glasses, hearing aids, dentures or partials may not be worn into surgery, please bring cases for these belongings   For patients admitted to the hospital, discharge time will be determined by your treatment team.   Patients discharged the day of surgery will not be allowed to drive home, and someone needs to stay with them for 24 hours.   SURGICAL WAITING ROOM VISITATION Patients having surgery or a procedure may have no more than 2 support people in the waiting area - these visitors may rotate.   Children under the age of 43 must have an adult  with them who is not the patient. If the patient needs to stay at the hospital during part of their recovery, the visitor guidelines for inpatient rooms apply. Pre-op nurse will coordinate an appropriate time for 1 support person to accompany patient in pre-op.  This support person may not rotate.   Please refer to [email or web address] for the visitor guidelines for Inpatients (after your surgery is over and you are in a regular room).    Special instructions:    Oral Hygiene is also important to reduce your risk of  infection.  Remember - BRUSH YOUR TEETH THE MORNING OF SURGERY WITH YOUR REGULAR TOOTHPASTE   [facility]- Preparing For Surgery  Before surgery, you can play an important role. Because skin is not sterile, your skin needs to be as free of germs as possible. You can reduce the number of germs on your skin by washing with CHG (chlorahexidine gluconate) Soap before surgery.  CHG is an antiseptic cleaner which kills germs and bonds with the skin to continue killing germs even after washing.     Please do not use if you have an allergy to CHG or antibacterial soaps. If your skin becomes reddened/irritated stop using the CHG.  Do not shave (including legs and underarms) for at least 48 hours prior to first CHG shower. It is OK to shave your face.  Please follow these instructions carefully.     Shower the NIGHT BEFORE SURGERY and the MORNING OF SURGERY with CHG Soap.   If you chose to wash your hair, wash your hair first as usual with your normal shampoo. After you shampoo, rinse your hair and body thoroughly to remove the shampoo.  Then [employer] and genitals (private parts) with your normal soap and rinse thoroughly to remove soap.  After that Use CHG Soap as you would any other liquid soap. You can apply CHG directly to the skin and wash gently with a scrungie or a clean washcloth.   Apply the CHG Soap to your body ONLY FROM THE NECK DOWN.  Do not use on open wounds or open sores. Avoid contact with your eyes, ears, mouth and genitals (private parts). Wash Face and genitals (private parts)  with your normal soap.   Wash thoroughly, paying special attention to the area where your surgery will be performed.  Thoroughly rinse your body with warm water from the neck down.  DO NOT shower/wash with your normal soap after using and rinsing off the CHG Soap.  Pat yourself dry with a CLEAN TOWEL.  Wear CLEAN PAJAMAS to bed the night before surgery  Place CLEAN SHEETS on your bed the night before  your surgery  DO NOT SLEEP WITH PETS.   Day of Surgery:  Take a shower with CHG soap. Wear Clean/Comfortable clothing the morning of surgery Do not apply any deodorants/lotions.   Remember to brush your teeth WITH YOUR REGULAR TOOTHPASTE.    If you received a COVID test during your pre-op visit, it is requested that you wear a mask when out in public, stay away from anyone that may not be feeling well, and notify your surgeon if you develop symptoms. If you have been in contact with anyone that has tested positive in the last 10 days, please notify your surgeon.    Please read over the following fact sheets that you were given.

## 2022-08-28 ENCOUNTER — Telehealth: Payer: Self-pay | Admitting: Orthopedic Surgery

## 2022-08-28 ENCOUNTER — Other Ambulatory Visit (HOSPITAL_COMMUNITY): Payer: 59

## 2022-08-28 ENCOUNTER — Inpatient Hospital Stay (HOSPITAL_COMMUNITY)
Admission: RE | Admit: 2022-08-28 | Discharge: 2022-08-28 | Disposition: A | Discharge status: Home / Self Care | Payer: 59 | Source: Ambulatory Visit

## 2022-08-28 NOTE — Telephone Encounter (Addendum)
Orthopedic Surgery Telephone Note  Patient had an ankle fracture dislocation.  Underwent closed reduction and splinting at the ER.  She talk to me on the phone today because she has concerns about undergoing operative management.  I told her my recommendation would be for operative stabilization even with the current reduction.  She had an ankle fracture on the contralateral side that got infected and required a PICC line and eventual hardware removal.  She is concerned that this will happen again because she is a smoker and I had informed her that her risk of infection would be higher.  I told her that even with her smoking I would still recommend operative stabilization to allow for earlier weightbearing and to reduce the risk of posttraumatic arthritis.  I went over the risks/benefits of surgery and the risks/benefits of nonoperative treatment. I told her my expectation with non-operative treatment is not anatomic alignment and she may develop persistent pain in that ankle. After this discussion, patient wanted to proceed with nonoperative treatment.  Her surgery for tomorrow will be canceled.  I told her I need to see her in the office tomorrow morning then.  I will likely remove her splint, place a new one and then check her alignment with x-rays.  I told her to show up at 815 tomorrow.  All patient questions were answered to her satisfaction.  Callie Fielding, MD Orthopedic Surgeon

## 2022-08-28 NOTE — Telephone Encounter (Signed)
Patient left a message 08/27/22 requesting a call back. She has some questions regarding surgery.

## 2022-08-29 ENCOUNTER — Ambulatory Visit (HOSPITAL_COMMUNITY): Admission: RE | Admit: 2022-08-29 | Payer: 59 | Source: Home / Self Care | Admitting: Orthopedic Surgery

## 2022-08-29 ENCOUNTER — Ambulatory Visit (INDEPENDENT_AMBULATORY_CARE_PROVIDER_SITE_OTHER): Payer: 59

## 2022-08-29 ENCOUNTER — Encounter: Payer: Self-pay | Admitting: Orthopedic Surgery

## 2022-08-29 ENCOUNTER — Ambulatory Visit (INDEPENDENT_AMBULATORY_CARE_PROVIDER_SITE_OTHER): Payer: 59 | Admitting: Orthopedic Surgery

## 2022-08-29 ENCOUNTER — Encounter (HOSPITAL_COMMUNITY): Admission: RE | Payer: Self-pay | Source: Home / Self Care

## 2022-08-29 DIAGNOSIS — S82851A Displaced trimalleolar fracture of right lower leg, initial encounter for closed fracture: Secondary | ICD-10-CM

## 2022-08-29 DIAGNOSIS — M79661 Pain in right lower leg: Secondary | ICD-10-CM

## 2022-08-29 SURGERY — OPEN REDUCTION INTERNAL FIXATION (ORIF) ANKLE FRACTURE
Anesthesia: General | Site: Ankle | Laterality: Right

## 2022-08-30 NOTE — Progress Notes (Signed)
Orthopedic Surgery Progress Note   Assessment: Patient is a 52 y.o. female with right ankle fracture/dislocation Date of injury: 08/23/2022   Plan: -Patient declined operative management -NWB RLE in splint -Reduction and well padded splint application in office today (see procedure note below) -Pain control: oxycodone -Return to office in 1 week, XRs at next visit: AP/mortise/lateral ankle   Right ankle procedure note: Patient had taken narcotics prior to arrival to clinic so this was used for pain control. Her ER placed splint was removed. Her ankle appeared displaced laterally. A reduction maneuver was performed and then a well padded plaster splint (short leg) was applied to the ankle while pressure was held to mold it to her ankle. Post reduction films in the splint were obtained in the office that showed a satisfactory reduction. I informed her that her ankle was no perfectly reduced but I did not think I could get it perfectly reduced and maintain that in a splint. That is why operative management had been discussed. She expressed understanding and wanted to continue with non-operative treatment. She tolerated the procedure well.  ___________________________________________________________________________  Subjective: Patient injured her ankle on a slide on 08/23/2022. Operative management had been discussed with her and the rationale for that recommendation. She had a bad experience with her last ankle fracture and wanted to treat this non-operatively. She has been in a splint that the ER made. Having pain over the anterior tibia but pain around the ankle is well controlled. Denies paresthesias and numbness.    Physical Exam:  General: no acute distress, appears stated age Neurologic: alert, answering questions appropriately, following commands Respiratory: unlabored breathing on room air, symmetric chest rise Psychiatric: appropriate affect, normal cadence to speech  MSK:   -Right  lower extremity  No tenderness to palpation over extremity, except over the ankle and distal 1/3 tibia. Ankle laterally displaced on inspection once splint removed Fires hip flexors, quadriceps, hamstrings, tibialis anterior, gastrocnemius and soleus, extensor hallucis longus Plantarflexes and dorsiflexes toes Sensation intact to light touch in sural, saphenous, tibial, deep peroneal, and superficial peroneal nerve distributions Foot warm and well perfused   Patient name: Brianna Bowman Patient MRN: [id number] Date: 08/30/22

## 2022-09-01 ENCOUNTER — Ambulatory Visit (INDEPENDENT_AMBULATORY_CARE_PROVIDER_SITE_OTHER): Payer: 59 | Admitting: Orthopedic Surgery

## 2022-09-01 DIAGNOSIS — S82891D Other fracture of right lower leg, subsequent encounter for closed fracture with routine healing: Secondary | ICD-10-CM | POA: Diagnosis not present

## 2022-09-01 MED ORDER — HYDROXYZINE PAMOATE 100 MG PO CAPS: 100.0000 mg | ORAL_CAPSULE | Freq: Three times a day (TID) | ORAL | 0 refills | Status: DC | PRN

## 2022-09-01 NOTE — Progress Notes (Signed)
Orthopedic Surgery Post-operative Office Visit  Procedure: Right ankle closed reduction and application of short leg splint Date of Injury: 08/23/2022  Assessment: Patient is a 52 y.o. who has a right ankle fracture/dislocation and is having pain around her splint   Plan: -Patient still wanting to proceed with nonoperative management so no acute operative plans -Loosened anterior aspect of splint where she was having pain, looked over the medial malleolus and did not see any skin changes or ulceration.  After losing a splint, patient said pain was better -Nonweightbearing right lower extremity in splint -Prescribed Atarax for her anxiety -Pain management: Oxycodone and Tylenol -Return to office in 1 weeks, x-rays needed at next visit: right ankle AP/lateral/mortise  ___________________________________________________________________________   Subjective: Since patient was seen on 08/29/2022, she has been having pain around her ankle.  She feels it over the anterior aspect of the ankle and over the medial malleolus.  She has been elevating above the level of the heart as frequently as she can.  She feels a lot of anxiety and she feels that that is contributing to her pain and concern about the ankle.  She also has claustrophobia and feels stuck in the splint.  She is not having any heel or lateral malleolus pain.  Denies paresthesia numbness.  Objective:  General: no acute distress, appropriate affect Neurologic: alert, answering questions appropriately, following commands Respiratory: unlabored breathing on room air Skin: The anterior aspect of the ankle was free of any compression, the splint has stirrups and there was no plaster material over the anterior ankle, the anterior aspect of the stirrups was loosened, no soft tissue breakdown was seen over the anterior medial malleolus, the posterior aspect of the medial malleolus was not well-visualized, adequate padding was noted  MSK  (RLE):  Tender to palpation over the anterior ankle  Cap refill less than 2 seconds in the toes  Sensation intact to light touch over the dorsal and plantar aspect of the toes  Plantar flexes and dorsiflexes toes  No new imaging obtained at today's visit   Patient name: Brianna Bowman Patient MRN: [id number] Date of visit: 09/01/22

## 2022-09-03 ENCOUNTER — Encounter: Payer: Self-pay | Admitting: Orthopedic Surgery

## 2022-09-03 MED ORDER — LORAZEPAM 1 MG PO TABS: 1.0000 mg | ORAL_TABLET | Freq: Three times a day (TID) | ORAL | 0 refills | Status: DC

## 2022-09-05 ENCOUNTER — Other Ambulatory Visit: Payer: Self-pay | Admitting: Orthopedic Surgery

## 2022-09-05 ENCOUNTER — Other Ambulatory Visit (INDEPENDENT_AMBULATORY_CARE_PROVIDER_SITE_OTHER): Payer: 59

## 2022-09-05 ENCOUNTER — Ambulatory Visit (INDEPENDENT_AMBULATORY_CARE_PROVIDER_SITE_OTHER): Payer: 59 | Admitting: Orthopedic Surgery

## 2022-09-05 DIAGNOSIS — S82891D Other fracture of right lower leg, subsequent encounter for closed fracture with routine healing: Secondary | ICD-10-CM

## 2022-09-05 MED ORDER — OXYCODONE HCL 5 MG PO TABS: 5.0000 mg | ORAL_TABLET | Freq: Four times a day (QID) | ORAL | 0 refills | Status: DC | PRN

## 2022-09-05 MED ORDER — OXYCODONE HCL 5 MG PO TABS: 5.0000 mg | ORAL_TABLET | ORAL | 0 refills | Status: AC | PRN

## 2022-09-05 MED ORDER — OXYCODONE HCL 5 MG PO TABS: 5.0000 mg | ORAL_TABLET | ORAL | 0 refills | Status: DC | PRN

## 2022-09-05 NOTE — Progress Notes (Addendum)
Orthopedic Surgery Post-operative Office Visit   Procedure: Right ankle closed reduction and application of short leg splint Date of Injury: 08/23/2022   Assessment: Patient is a 52 y.o. who has a right ankle fracture/dislocation. Pain improved since splint was adjusted earlier this week     Plan: -No acute operative plans -Nonweightbearing right lower extremity in splint -Prescribed oxycodone. She should continue to use tylenol with this oxycodone -Again explained, that her reduction is not perfect but I do not expect it to maintain perfect reduction in a splint since this was a fracture dislocation.  I told her that this may result in posttraumatic arthritis.  She may also get a nonunion.  She expressed understanding and wants to continue with nonoperative management. -Return to office in 1 week, x-rays needed at next visit: right ankle AP/lateral/mortise   ___________________________________________________________________________     Subjective: Patient injured her ankle while on a slide on 08/23/2022. Since she was last seen, ankle pain has gotten better.  She is not having a pain around the splint.  Is having trouble getting around with crutches because her left ankle has significant pain and it from her prior surgery on that side.  She has been mostly getting around with wheelchair.  Denies paresthesia numbness.   Objective:   General: no acute distress, appropriate affect Neurologic: alert, answering questions appropriately, following commands Respiratory: unlabored breathing on room air Skin: The anterior aspect of the ankle was free of any compression, the splint has stirrups and there was no plaster material over the anterior ankle, the anterior aspect of the stirrups was loosened, no soft tissue breakdown was seen over the anterior medial malleolus, the posterior aspect of the medial malleolus was not well-visualized, adequate padding was noted   MSK (RLE):             No  tenderness to palpation at areas outside of the splint  Splint intact and in place             Cap refill less than 2 seconds in the toes             Sensation intact to light touch over the dorsal and plantar aspect of the toes             Plantar flexes and dorsiflexes toes   XRs of the right ankle taken 09/05/2022 were independently reviewed and interpreted, showing a bimalleolar ankle fracture.  Alignment appears maintained since last films on 08/29/2022.  The talus is underneath the tibia on both AP and lateral views.  There is a small step-off seen near the medial malleolus on the mortise view. No new fracture seen.     Patient name: Brianna Bowman Patient MRN: [id number] Date of visit: 09/05/22

## 2022-09-05 NOTE — Addendum Note (Signed)
Addended by: Ileene Rubens on: 09/05/2022 01:12 PM   Modules accepted: Level of Service

## 2022-09-05 NOTE — Addendum Note (Signed)
Addended by: Ileene Rubens on: 09/05/2022 12:28 PM   Modules accepted: Orders

## 2022-09-05 NOTE — Addendum Note (Signed)
Addended by: Ileene Rubens on: 09/05/2022 10:47 AM   Modules accepted: Orders

## 2022-09-05 NOTE — Addendum Note (Signed)
Addended by: Ileene Rubens on: 09/05/2022 01:12 PM   Modules accepted: Orders

## 2022-09-10 ENCOUNTER — Ambulatory Visit: Payer: 59 | Admitting: Student

## 2022-09-10 ENCOUNTER — Encounter: Payer: Self-pay | Admitting: Student

## 2022-09-10 VITALS — BP 100/60 | HR 102 | Temp 97.9°F | Ht 63.5 in

## 2022-09-10 DIAGNOSIS — R319 Hematuria, unspecified: Secondary | ICD-10-CM | POA: Diagnosis not present

## 2022-09-10 DIAGNOSIS — S82851A Displaced trimalleolar fracture of right lower leg, initial encounter for closed fracture: Secondary | ICD-10-CM

## 2022-09-10 DIAGNOSIS — F430 Acute stress reaction: Secondary | ICD-10-CM | POA: Diagnosis not present

## 2022-09-10 DIAGNOSIS — F1190 Opioid use, unspecified, uncomplicated: Secondary | ICD-10-CM

## 2022-09-10 DIAGNOSIS — R82998 Other abnormal findings in urine: Secondary | ICD-10-CM

## 2022-09-10 DIAGNOSIS — H538 Other visual disturbances: Secondary | ICD-10-CM

## 2022-09-10 MED ORDER — DULOXETINE HCL 30 MG PO CPEP: 30.0000 mg | ORAL_CAPSULE | Freq: Every day | ORAL | 2 refills | Status: DC

## 2022-09-10 MED ORDER — BUSPIRONE HCL 5 MG PO TABS: 5.0000 mg | ORAL_TABLET | Freq: Three times a day (TID) | ORAL | 1 refills | Status: DC

## 2022-09-10 NOTE — Assessment & Plan Note (Signed)
Assessment: Right ankle fracture and dislocation on 08/23/2022. She is s/p closed reduction. With prior surgical complications and long term she is not wanting surgical intervention. She is continuing to follow with Dr. Laurance Flatten of orthopedics who discussed with her the risks of non-operative management.   She is continuing to have some discomfort and pain, she and her partner note she is taking the oxycodone as prescribed. She has follow up with Dr. Laurance Flatten in the coming weeks to assess her progress. Encouraged her to continue ongoing discussions with Dr. Laurance Flatten especially about the risks of non-operative management.   Plan: - continue to follow with orthopedics

## 2022-09-10 NOTE — Assessment & Plan Note (Signed)
Assessment: Endorses pink colored urine when wiping. This occurred two days ago, denies being able to tell if any changes in toilet water color. This has since resolved and has not occurred since. Denies bloody stools. She has had this in the past with prior kidney and bladder infections. She has noticed increased urinary frequency. With these symptoms will check UA and urine culture.   Plan: - UA and urine culture - treat if UA concerning for infection

## 2022-09-10 NOTE — Patient Instructions (Addendum)
Thank you, Ms.Chelcy L Beckers for allowing [place] to provide your care today. Today we discussed .    Ankle fracture Please follow up with orthopedics. If you are having difficulty controlling your pain or with taking the pain medications, please come see [place] sooner.   Pap smear We will follow up with this once your ankle is better  Stress reaction I am so sorry you are going through this and having a flair up of your anxiety. I will refer you to counseling. If your symptoms persist we can consider additional medications  Blood in urine Next time you have blood in your urine please call our clinic and present that day to give [place] a urine sample  I have ordered the following labs for you:  Lab Orders  No laboratory test(s) ordered today      Referrals ordered today:   Referral Orders  No referral(s) requested today     I have ordered the following medication/changed the following medications:   Stop the following medications: Medications Discontinued During This Encounter  Medication Reason   hydrOXYzine (VISTARIL) 100 MG capsule      Start the following medications: No orders of the defined types were placed in this encounter.    Follow up:  1 month follow up     Should you have any questions or concerns please call the internal medicine clinic at [phone].    Sanjuana Letters, D.O. [place]

## 2022-09-10 NOTE — Assessment & Plan Note (Signed)
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 After her prescription there were times where she was purchasing opioids from others. Since then she has been stable and without further use. She recently fractured her right foot and is attempting to manage without any surgical intervention. She was recently prescribed oxycodone by Dr. Laurance Flatten orthopedics.   We discussed she has nay increased cravings or desire to inappropriately use or purchase opioids to present to our clinic for discussion in regards to opioid use disorder and consideration of buprenorphine-naloxone.   Plan: - monitor for any signs of opioid misuse

## 2022-09-10 NOTE — Progress Notes (Signed)
CC: follow up right ankle fracture  HPI:  [name] is a 52 y.o. female living with a history stated below and presents today for right ankle fracture. Please see problem based assessment and plan for additional details.  Past Medical History:  Diagnosis Date   Anemia    with pregnancy   Anxiety    due to condition   Depression    Headache(784.0)    history of   PONV (postoperative nausea and vomiting)    with epidural anad 04/28/13 BP low and took longer to wake after surgery 04/28/2012    Current Outpatient Medications on File Prior to Visit  Medication Sig Dispense Refill   aspirin EC 81 MG tablet Take 81-325 mg by mouth every 6 (six) hours as needed for moderate pain. Swallow whole.     diclofenac Sodium (VOLTAREN ARTHRITIS PAIN) 1 % GEL Apply 2 g topically 4 (four) times daily. To left ankle 4 g 3   LORazepam (ATIVAN) 1 MG tablet Take 1 tablet (1 mg total) by mouth every 8 (eight) hours. 30 tablet 0   meloxicam (MOBIC) 7.5 MG tablet Take 1 tablet (7.5 mg total) by mouth daily as needed for pain. 30 tablet 2   oxyCODONE (ROXICODONE) 5 MG immediate release tablet Take 1 tablet (5 mg total) by mouth every 4 (four) hours as needed for up to 5 days for severe pain. 30 tablet 0   No current facility-administered medications on file prior to visit.    Review of Systems: ROS negative except for what is noted on the assessment and plan.  Vitals:   09/10/22 1536  BP: 100/60  Pulse: (!) 102  Temp: 97.9 F (36.6 C)  TempSrc: Oral  SpO2: 99%  Height: 5' 3.5" (1.613 m)    Physical Exam: Constitutional:  in no acute distress HENT: normocephalic atraumatic, mucous membranes moist Eyes: conjunctiva non-erythematous Neck: supple Cardiovascular: regular rate and rhythm, no m/r/g Pulmonary/Chest: normal work of breathing on room air, lungs clear to auscultation bilaterally Abdominal: soft, non-tender, non-distended MSK: normal bulk and tone Neurological: alert &  oriented x 3, left face with decreased sensation. Decreased peripheral vision. No facial asymmetry. No uvular deviation or tongue fasciculations. No pronator drift. 5/5 strength in bilateral upper and lower extremities, normal gait Skin: warm and dry Psych: tearful at times  Assessment & Plan:   Opioid use Assessment: Endorses history of opoid use disorder after prior left foot fracture and infection. After her prescription there were times where she was purchasing opioids from others. Since then she has been stable and without further use. She recently fractured her right foot and is attempting to manage without any surgical intervention. She was recently prescribed oxycodone by Dr. Laurance Flatten orthopedics.   We discussed she has nay increased cravings or desire to inappropriately use or purchase opioids to present to our clinic for discussion in regards to opioid use disorder and consideration of buprenorphine-naloxone.   Plan: - monitor for any signs of opioid misuse  Closed displaced trimalleolar fracture of lower leg, right, initial encounter Assessment: Right ankle fracture and dislocation on 08/23/2022. She is s/p closed reduction. With prior surgical complications and long term she is not wanting surgical intervention. She is continuing to follow with Dr. Laurance Flatten of orthopedics who discussed with her the risks of non-operative management.   She is continuing to have some discomfort and pain, she and her partner note she is taking the oxycodone as prescribed. She has follow up with Dr.  Moore in the coming weeks to assess her progress. Encouraged her to continue ongoing discussions with Dr. Laurance Flatten especially about the risks of non-operative management.   Plan: - continue to follow with orthopedics  Blurry vision Assessment: Hx of blurry vision in right eye that occurred suddenly months ago. She has not yet followed up with an eye specialist. She notes that 6 days ago she had sudden blurry  vision of her left eye. She has decreased peripheral vision. She denies any pain with eye movements.  Neurological exam is positive for decreased sensation in her left cheek. Visual field deficits in peripheral visions.Central vision intact. She notes blurriness in her peripheral vision, central vision is clear at 12 oclock position only. No facial asymmetry, tongue fasciculations, or uvular deviation. Her speech is clear and normal. No pronator drift. Upper and lower extremity strength is 5/5.   Differential includes acute CVA vs TIA vs retinal detachment. Recommended she present to the ED for further imaging and work up .   Plan: - patient sent to ED for evaluation of acute blurry vision and left facial numbness - outside of window for TPA, sx onset 6 days ago  Acute stress reaction Assessment: Increased depression and anxiety since fracturing right ankle. She denies similar symptoms prior to this event. Likely acute stress reaction. She denies history of anxiety/depression but on chart review there is mention of this. Will start buspar for acute symptoms as well as duloxetine for long term management. She is currently being prescribed lorazepam by her orthopedist, was unable to tolerate atarax.   Plan: - buspar 5 mg TID, duloextine 30 mg daily - [facility] referral - would avoid further benzodiazepine with her requiring opioids and prior OUD  Pink-colored urine Assessment: Endorses pink colored urine when wiping. This occurred two days ago, denies being able to tell if any changes in toilet water color. This has since resolved and has not occurred since. Denies bloody stools. She has had this in the past with prior kidney and bladder infections. She has noticed increased urinary frequency. With these symptoms will check UA and urine culture.   Plan: - UA and urine culture - treat if UA concerning for infection  Patient discussed with Dr. Butch Penny, D.O. [facility], PGY-3 Phone: [phone] Date 09/10/2022 Time 9:26 PM

## 2022-09-10 NOTE — Assessment & Plan Note (Signed)
Assessment: Hx of blurry vision in right eye that occurred suddenly months ago. She has not yet followed up with an eye specialist. She notes that 6 days ago she had sudden blurry vision of her left eye. She has decreased peripheral vision. She denies any pain with eye movements.  Neurological exam is positive for decreased sensation in her left cheek. Visual field deficits in peripheral visions.Central vision intact. She notes blurriness in her peripheral vision, central vision is clear at 12 oclock position only. No facial asymmetry, tongue fasciculations, or uvular deviation. Her speech is clear and normal. No pronator drift. Upper and lower extremity strength is 5/5.   Differential includes acute CVA vs TIA vs retinal detachment. Recommended she present to the ED for further imaging and work up .   Plan: - patient sent to ED for evaluation of acute blurry vision and left facial numbness - outside of window for TPA, sx onset 6 days ago

## 2022-09-10 NOTE — Assessment & Plan Note (Signed)
Assessment: Increased depression and anxiety since fracturing right ankle. She denies similar symptoms prior to this event. Likely acute stress reaction. She denies history of anxiety/depression but on chart review there is mention of this. Will start buspar for acute symptoms as well as duloxetine for long term management. She is currently being prescribed lorazepam by her orthopedist, was unable to tolerate atarax.   Plan: - buspar 5 mg TID, duloextine 30 mg daily - [facility] referral - would avoid further benzodiazepine with her requiring opioids and prior OUD

## 2022-09-11 ENCOUNTER — Emergency Department (HOSPITAL_COMMUNITY): Payer: 59

## 2022-09-11 ENCOUNTER — Other Ambulatory Visit: Payer: Self-pay

## 2022-09-11 ENCOUNTER — Emergency Department (HOSPITAL_COMMUNITY)
Admission: EM | Admit: 2022-09-11 | Discharge: 2022-09-11 | Disposition: A | Discharge status: Home / Self Care | Payer: 59 | Attending: Emergency Medicine | Admitting: Emergency Medicine

## 2022-09-11 ENCOUNTER — Other Ambulatory Visit (INDEPENDENT_AMBULATORY_CARE_PROVIDER_SITE_OTHER): Payer: 59

## 2022-09-11 ENCOUNTER — Encounter (HOSPITAL_COMMUNITY): Payer: Self-pay

## 2022-09-11 ENCOUNTER — Ambulatory Visit (INDEPENDENT_AMBULATORY_CARE_PROVIDER_SITE_OTHER): Payer: 59 | Admitting: Orthopedic Surgery

## 2022-09-11 DIAGNOSIS — S82891D Other fracture of right lower leg, subsequent encounter for closed fracture with routine healing: Secondary | ICD-10-CM | POA: Diagnosis not present

## 2022-09-11 DIAGNOSIS — R42 Dizziness and giddiness: Secondary | ICD-10-CM | POA: Insufficient documentation

## 2022-09-11 DIAGNOSIS — R519 Headache, unspecified: Secondary | ICD-10-CM | POA: Insufficient documentation

## 2022-09-11 DIAGNOSIS — H538 Other visual disturbances: Secondary | ICD-10-CM | POA: Diagnosis not present

## 2022-09-11 DIAGNOSIS — Z7982 Long term (current) use of aspirin: Secondary | ICD-10-CM | POA: Insufficient documentation

## 2022-09-11 LAB — I-STAT CHEM 8, ED
BUN: 7 mg/dL (ref 6–20)
Calcium, Ion: 1.27 mmol/L (ref 1.15–1.40)
Chloride: 105 mmol/L (ref 98–111)
Creatinine, Ser: 0.7 mg/dL (ref 0.44–1.00)
Glucose, Bld: 89 mg/dL (ref 70–99)
HCT: 42 % (ref 36.0–46.0)
Hemoglobin: 14.3 g/dL (ref 12.0–15.0)
Potassium: 4.2 mmol/L (ref 3.5–5.1)
Sodium: 140 mmol/L (ref 135–145)
TCO2: 25 mmol/L (ref 22–32)

## 2022-09-11 LAB — CBC
HCT: 43.1 % (ref 36.0–46.0)
Hemoglobin: 13.8 g/dL (ref 12.0–15.0)
MCH: 31.5 pg (ref 26.0–34.0)
MCHC: 32 g/dL (ref 30.0–36.0)
MCV: 98.4 fL (ref 80.0–100.0)
Platelets: 347 10*3/uL (ref 150–400)
RBC: 4.38 MIL/uL (ref 3.87–5.11)
RDW: 13.2 % (ref 11.5–15.5)
WBC: 5.4 10*3/uL (ref 4.0–10.5)
nRBC: 0 % (ref 0.0–0.2)

## 2022-09-11 LAB — COMPREHENSIVE METABOLIC PANEL
ALT: 25 U/L (ref 0–44)
AST: 24 U/L (ref 15–41)
Albumin: 3.9 g/dL (ref 3.5–5.0)
Alkaline Phosphatase: 57 U/L (ref 38–126)
Anion gap: 7 (ref 5–15)
BUN: 6 mg/dL (ref 6–20)
CO2: 25 mmol/L (ref 22–32)
Calcium: 9.5 mg/dL (ref 8.9–10.3)
Chloride: 106 mmol/L (ref 98–111)
Creatinine, Ser: 0.72 mg/dL (ref 0.44–1.00)
GFR, Estimated: >60 mL/min (ref >60)
Glucose, Bld: 98 mg/dL (ref 70–99)
Potassium: 4.1 mmol/L (ref 3.5–5.1)
Sodium: 138 mmol/L (ref 135–145)
Total Bilirubin: 0.5 mg/dL (ref 0.3–1.2)
Total Protein: 7 g/dL (ref 6.5–8.1)

## 2022-09-11 LAB — URINALYSIS, COMPLETE
Bilirubin, UA: NEGATIVE
Glucose, UA: NEGATIVE
Ketones, UA: NEGATIVE
Nitrite, UA: POSITIVE — AB
Protein,UA: NEGATIVE
RBC, UA: NEGATIVE
Specific Gravity, UA: 1.01 (ref 1.005–1.030)
Urobilinogen, Ur: 0.2 mg/dL (ref 0.2–1.0)
pH, UA: 6 (ref 5.0–7.5)

## 2022-09-11 LAB — DIFFERENTIAL
Abs Immature Granulocytes: 0.02 10*3/uL (ref 0.00–0.07)
Basophils Absolute: 0.1 10*3/uL (ref 0.0–0.1)
Basophils Relative: 1 %
Eosinophils Absolute: 0.1 10*3/uL (ref 0.0–0.5)
Eosinophils Relative: 2 %
Immature Granulocytes: 0 %
Lymphocytes Relative: 33 %
Lymphs Abs: 1.8 10*3/uL (ref 0.7–4.0)
Monocytes Absolute: 0.3 10*3/uL (ref 0.1–1.0)
Monocytes Relative: 6 %
Neutro Abs: 3.1 10*3/uL (ref 1.7–7.7)
Neutrophils Relative %: 58 %

## 2022-09-11 LAB — MICROSCOPIC EXAMINATION
Casts: NONE SEEN /lpf
RBC, Urine: NONE SEEN /hpf (ref 0–2)

## 2022-09-11 LAB — ETHANOL: Alcohol, Ethyl (B): <10 mg/dL (ref <10)

## 2022-09-11 LAB — I-STAT BETA HCG BLOOD, ED (MC, WL, AP ONLY): I-stat hCG, quantitative: <5 m[IU]/mL (ref <5)

## 2022-09-11 LAB — APTT: aPTT: 30 seconds (ref 24–36)

## 2022-09-11 LAB — PROTIME-INR
INR: 1 (ref 0.8–1.2)
Prothrombin Time: 12.9 seconds (ref 11.4–15.2)

## 2022-09-11 LAB — CBG MONITORING, ED: Glucose-Capillary: 79 mg/dL (ref 70–99)

## 2022-09-11 MED ORDER — NICOTINE 21 MG/24HR TD PT24
21.0000 mg | MEDICATED_PATCH | Freq: Once | TRANSDERMAL | Status: DC
  Administered 2022-09-11: 21 mg via TRANSDERMAL
  Filled 2022-09-11: qty 1

## 2022-09-11 MED ORDER — IBUPROFEN 400 MG PO TABS
600.0000 mg | ORAL_TABLET | Freq: Once | ORAL | Status: AC
  Administered 2022-09-11: 600 mg via ORAL
  Filled 2022-09-11: qty 1

## 2022-09-11 MED ORDER — KETOROLAC TROMETHAMINE 15 MG/ML IJ SOLN: 15.0000 mg | Freq: Once | INTRAMUSCULAR | Status: DC

## 2022-09-11 MED ORDER — ACETAMINOPHEN 325 MG PO TABS
650.0000 mg | ORAL_TABLET | Freq: Once | ORAL | Status: AC
  Administered 2022-09-11: 650 mg via ORAL
  Filled 2022-09-11: qty 2

## 2022-09-11 MED ORDER — GADOBUTROL 1 MMOL/ML IV SOLN
6.0000 mL | Freq: Once | INTRAVENOUS | Status: AC | PRN
  Administered 2022-09-11: 6 mL via INTRAVENOUS

## 2022-09-11 MED ORDER — SODIUM CHLORIDE 0.9% FLUSH: 3.0000 mL | Freq: Once | INTRAVENOUS | Status: DC

## 2022-09-11 MED ORDER — METOCLOPRAMIDE HCL 5 MG/ML IJ SOLN: 10.0000 mg | Freq: Once | INTRAMUSCULAR | Status: DC

## 2022-09-11 MED ORDER — LORAZEPAM 1 MG PO TABS
1.0000 mg | ORAL_TABLET | Freq: Once | ORAL | Status: AC
  Administered 2022-09-11: 1 mg via ORAL
  Filled 2022-09-11: qty 1

## 2022-09-11 MED ORDER — OXYCODONE-ACETAMINOPHEN 5-325 MG PO TABS
1.0000 | ORAL_TABLET | Freq: Once | ORAL | Status: AC
  Administered 2022-09-11: 1 via ORAL
  Filled 2022-09-11: qty 1

## 2022-09-11 NOTE — ED Provider Notes (Signed)
Patient signed out to me at 1600 by Dr. Tomi Bamberger pending neurology consult.  In short this is a 52 year old female who presented to the emergency department with headaches and vision changes.  The patient reported several years ago she woke up 1 morning with vision loss in the right eye.  She states that it feels like there is a Saran wrap over her vision but she did not have insurance at that time and was unable to see an ophthalmologist.  She states that about 6 days ago when she woke up she had blurred vision in the center of her left eye.  She states that she has associated headaches however reports that she is currently on Percocets for her ankle fracture and she thinks that she may be getting rebound headaches when the Percocets wear off.  She reported left-sided facial numbness to triage but denies any current numbness or weakness to me.  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  Neurology will be consulted for further recommendations.  Clinical Course as of 09/11/22 2014  Thu Sep 11, 2022  1326 Labs reviewed.  No significant abnormalities. [id number] CT scan without acute findings. 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 [JK]  50 Dr. Rory Percy of neurology who recommended MRI brain w/ contrast to further evaluate for demyelinating disease and MRI orbits. [VK]  2011 Patient's MRI brain with contrast and MRI orbits showed no new abnormalities.  I spoke with Dr. Leonel Ramsay with neurology who stated that if the patient had a no evidence of afferent pupillary defect that she would be stable for discharge home with outpatient ophthalmology follow-up.  On my reevaluation of the patient her pupils are equal and reactive bilaterally with no evidence of afferent pupillary defect.  The patient is stable for discharge and will be given ophthalmology  follow-up. [VK]    Clinical Course User Index [JK] Dorie Rank, MD [VK] Kemper Durie, DO      Kemper Durie, [place] 09/11/22 2014

## 2022-09-11 NOTE — ED Notes (Addendum)
Error in charting.

## 2022-09-11 NOTE — Progress Notes (Signed)
rthopedic Surgery Post-operative Office Visit   Procedure: Right ankle closed reduction and application of short leg splint Date of Injury: 08/23/2022 (~3 weeks from injury)   Assessment: Patient is a 52 y.o. who has a right ankle fracture/dislocation who is maintaining alignment in the splint and pain is decreasing with time     Plan: -No acute operative plans -Nonweightbearing right lower extremity in splint -Tentatively planning to transition her to a boot at our 7-week appointment, will be nonweightbearing for 3 months -Pain control: Weaning oxycodone, Tylenol -Return to office in 2 weeks, x-rays needed at next visit: right ankle AP/lateral/mortise   ___________________________________________________________________________     Subjective: Patient injured her ankle while on a slide on 08/23/2022. Since she was last seen, ankle pain has improved.  She is noticing less pain.  She is only taking 1 oxycodone per day.  She feels comfortable in the splint.  Denies paresthesias and numbness.   Objective:   General: no acute distress, appropriate affect Neurologic: alert, answering questions appropriately, following commands Respiratory: unlabored breathing on room air Skin: The anterior aspect of the ankle was free of any compression, the splint has stirrups and there was no plaster material over the anterior ankle, the anterior aspect of the stirrups was loosened, no soft tissue breakdown was seen over the anterior medial malleolus, the posterior aspect of the medial malleolus was not well-visualized, adequate padding was noted   MSK (RLE):             No tenderness to palpation at areas outside of the splint             Splint intact and in place             Cap refill less than 2 seconds in the toes             Sensation intact to light touch over the dorsal and plantar aspect of the toes             Plantar flexes and dorsiflexes toes   XRs of the right ankle taken 09/11/2022 were  independently reviewed and interpreted, showing a bimalleolar ankle fracture.  Alignment appears maintained since last films on 09/05/2022.  Talus is under the tibia on both AP and lateral views.  Medial malleolus is displaced which is best seen on the mortise view.  Appears to be a stable amount of displacement from prior films.  No new fracture seen.     Patient name: Brianna Bowman Patient MRN: [id number] Date of visit: 09/11/22

## 2022-09-11 NOTE — ED Triage Notes (Addendum)
Pt c/o HA, nausea, dizziness, left sided facial numbness, blurred vision of left eyex6d. Pt states left sided facial numbness has resolved, but the rest of the sx, pt still has.

## 2022-09-11 NOTE — Discharge Instructions (Signed)
You were seen in the emergency department for your blurred vision.  Your workup here showed no signs of stroke or obvious explanation for your blurred vision.  You should follow-up with the ophthalmologist to have your vision rechecked and for further evaluation to determine the cause of your blurred vision.  You should return to the emergency department if you have complete loss of vision, severe eye pain, numbness or weakness on one side of your body compared to the other or any other new or concerning symptoms.

## 2022-09-11 NOTE — ED Provider Notes (Signed)
[facility] Provider Note   CSN: [id number] Arrival date & time: 09/11/22  1113     History  Chief Complaint  Patient presents with   Dizziness   Headache    Brianna Bowman is a 52 y.o. female.   Dizziness Associated symptoms: headaches   Headache Associated symptoms: dizziness      Patient has history of depression anemia anxiety headache who presents to the ED with complaints of dizziness visual disturbance some trouble with her balance.  Patient states the symptoms have been ongoing about a week.  She feels like her vision is somewhat blurry and cloudy.  She also feels that her balance has been off for a bit.  About a week ago she started having blurriness in her left eye and felt like it was worse.  She felt like her peripheral vision decreased.  She then started neuro some trouble in her right side.  Patient denies any focal numbness or weakness.  She is not having any trouble with her speech.  She saw her doctor in the office yesterday and they recommended coming to the ED for evaluation of a stroke.  Patient states she had other things to do so she ended up coming today.  Home Medications Prior to Admission medications   Medication Sig Start Date End Date Taking? Authorizing Provider  aspirin EC 81 MG tablet Take 81-325 mg by mouth every 6 (six) hours as needed for moderate pain. Swallow whole.    [provider]  busPIRone (BUSPAR) 5 MG tablet Take 1 tablet (5 mg total) by mouth 3 (three) times daily. 09/10/22   Katsadouros, Vasilios, MD  diclofenac Sodium (VOLTAREN ARTHRITIS PAIN) 1 % GEL Apply 2 g topically 4 (four) times daily. To left ankle 08/12/22   Leigh Aurora, DO  DULoxetine (CYMBALTA) 30 MG capsule Take 1 capsule (30 mg total) by mouth daily. 09/10/22 09/10/23  Riesa Pope, MD  LORazepam (ATIVAN) 1 MG tablet Take 1 tablet (1 mg total) by mouth every 8 (eight) hours. 09/03/22   Callie Fielding, MD   meloxicam (MOBIC) 7.5 MG tablet Take 1 tablet (7.5 mg total) by mouth daily as needed for pain. 08/12/22 11/10/22  Leigh Aurora, DO      Allergies    Amoxicillin, Adhesive [tape], Aloe, and Shellfish allergy    Review of Systems   Review of Systems  Neurological:  Positive for dizziness and headaches.    Physical Exam Updated Vital Signs BP 100/66   Pulse 67   Temp 97.9 F (36.6 C) (Oral)   Resp 14   Ht 1.613 m (5' 3.5")   Wt 61.9 kg   LMP 08/23/2022   SpO2 100%   BMI 23.79 kg/m  Physical Exam Vitals and nursing note reviewed.  Constitutional:      General: She is not in acute distress.    Appearance: She is well-developed.  HENT:     Head: Normocephalic and atraumatic.     Right Ear: External ear normal.     Left Ear: External ear normal.  Eyes:     General: No scleral icterus.       Right eye: No discharge.        Left eye: No discharge.     Conjunctiva/sclera: Conjunctivae normal.  Neck:     Trachea: No tracheal deviation.  Cardiovascular:     Rate and Rhythm: Normal rate and regular rhythm.  Pulmonary:     Effort: Pulmonary effort  is normal. No respiratory distress.     Breath sounds: Normal breath sounds. No stridor. No wheezing or rales.  Abdominal:     General: Bowel sounds are normal. There is no distension.     Palpations: Abdomen is soft.     Tenderness: There is no abdominal tenderness. There is no guarding or rebound.  Musculoskeletal:        General: No tenderness or deformity.     Cervical back: Neck supple.  Skin:    General: Skin is warm and dry.     Findings: No rash.  Neurological:     General: No focal deficit present.     Mental Status: She is alert.     Cranial Nerves: No cranial nerve deficit, dysarthria or facial asymmetry.     Sensory: No sensory deficit.     Motor: No abnormal muscle tone or seizure activity.     Coordination: Coordination normal. Finger-Nose-Finger Test normal.     Comments: Decreased superior field of vision, pt  states it is diminished, normal finger to nose exam, no pronator drift  Psychiatric:        Mood and Affect: Mood normal.     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 EXAM: MRI HEAD AND ORBITS WITH CONTRAST TECHNIQUE: Multiplanar, multiecho pulse sequences of the brain and surrounding structures were obtained with intravenous contrast. Multiplanar, multiecho pulse sequences of the orbits and surrounding structures were obtained including fat saturation techniques, after the intravenous contrast administration. CONTRAST:  66mL GADAVIST GADOBUTROL 1 MMOL/ML IV SOLN COMPARISON:  Same day MRI head. FINDINGS: MRI HEAD FINDINGS Brain: No substantial change from same day MRI head. Please see that study for description of white matter changes. No evidence of acute hemorrhage, convincing evidence of acute infarct, mass lesion or midline shift. Postcontrast imaging was also performed with out evidence of pathologic enhancement. Vascular: Major arterial flow voids are maintained at the skull base. Skull and upper cervical spine: Normal marrow signal. Other: No mastoid effusions. MRI ORBITS FINDINGS Orbits: No traumatic or inflammatory finding. Globes, optic nerves, orbital fat, extraocular muscles, vascular structures, and lacrimal glands are normal. No pathologic enhancement. Visualized sinuses: Clear. Soft tissues: Negative. IMPRESSION: MRI head: No substantial change from same day MRI head. Please see that study for description of white matter changes. No associated pathologic enhancement. MRI  orbits: Unremarkable MRI of the orbits.  No acute abnormality. Electronically Signed   By: Margaretha Sheffield M.D.   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 EXAM: MRI HEAD AND ORBITS WITH CONTRAST TECHNIQUE: Multiplanar, multiecho pulse sequences of the brain and surrounding structures were obtained with intravenous contrast. Multiplanar, multiecho pulse sequences of the orbits and surrounding structures were obtained including fat saturation techniques, after the intravenous contrast administration. CONTRAST:  67mL GADAVIST GADOBUTROL 1 MMOL/ML IV SOLN COMPARISON:  Same day MRI head. FINDINGS: MRI HEAD FINDINGS Brain: No substantial change from same day MRI head. Please see  that study for description of white matter changes. No evidence of acute hemorrhage, convincing evidence of acute infarct, mass lesion or midline shift. Postcontrast imaging was also performed with out evidence of pathologic enhancement. Vascular: Major arterial flow voids are maintained at the skull base. Skull and upper cervical spine: Normal marrow signal. Other: No mastoid effusions. MRI ORBITS FINDINGS Orbits: No traumatic or inflammatory finding. Globes, optic nerves, orbital fat, extraocular muscles, vascular structures, and lacrimal glands are normal. No pathologic enhancement. Visualized sinuses: Clear. Soft tissues: Negative. IMPRESSION: MRI head: No substantial change from same day MRI head. Please see that study for description of white matter changes. No associated pathologic enhancement. MRI orbits: Unremarkable MRI of the orbits.  No acute abnormality. Electronically Signed   By: Margaretha Sheffield M.D.   On: 09/11/2022 19:21   MR BRAIN WO CONTRAST  Result Date: 09/11/2022 CLINICAL DATA:  Neuro deficit, acute, stroke suspected EXAM: MRI HEAD WITHOUT CONTRAST TECHNIQUE: Multiplanar, multiecho pulse sequences  of the brain and surrounding structures were obtained without intravenous contrast. COMPARISON:  None Available. FINDINGS: Brain: No convincing evidence of acute infarct. Areas of mild DWI hyperintensity in the white matter do not have correlate ADC hypointensity in therefore favored represent artifactual T2 shine through. Areas of T2/FLAIR hyperintensity in the white matter are advanced for age, including subcortical T2/FLAIR hyperintensity in the occipital lobes bilaterally. No evidence of acute hemorrhage, mass lesion, midline shift or hydrocephalus. Vascular: Major arterial flow voids are maintained at the skull base. Skull and upper cervical spine: Normal marrow signal. Sinuses/Orbits: Clear sinuses. No acute orbital findings. IMPRESSION: 1. Age advanced moderate T2/FLAIR hyperintensity in the periventricular and subcortical white matter. This finding is nonspecific but can be seen in the setting of accelerated chronic microvascular ischemia, a demyelinating process such as multiple sclerosis, vasculitis, chronic migraines, or as the sequelae of a prior infectious or inflammatory process. 2. Additionally, symmetric subcortical T2/FLAIR hyperintensity in the occipital lobes bilaterally. This could reflect the same process as impression #1, but also could be secondary to posterior reversible encephalopathy (PRES) in the correct clinical setting. Electronically Signed   By: Margaretha Sheffield M.D.   On: 09/11/2022 16:25   CT HEAD WO CONTRAST  Result Date: 09/11/2022 CLINICAL DATA:  Headache, dizziness, blurred vision in the left eye, left-sided facial numbness EXAM: CT HEAD WITHOUT CONTRAST TECHNIQUE: Contiguous axial images were obtained from the base of the skull through the vertex without intravenous contrast. RADIATION DOSE REDUCTION: This exam was performed according to the departmental dose-optimization program which includes automated exposure control, adjustment of the mA and/or kV according to patient  size and/or use of iterative reconstruction technique. COMPARISON:  12/11/2011 FINDINGS: Brain: No evidence of acute infarction, hemorrhage, mass, mass effect, or midline shift. No hydrocephalus or extra-axial fluid collection. Vascular: No hyperdense vessel. Skull: Negative for fracture or focal lesion. Sinuses/Orbits: No acute finding. Other: The mastoid air cells are well aerated. IMPRESSION: No acute intracranial process. Electronically Signed   By: Merilyn Baba M.D.   On: 09/11/2022 12:45    Procedures Procedures    Medications Ordered in ED Medications  sodium chloride flush (NS) 0.9 % injection 3 mL (has no administration in time range)  nicotine (NICODERM CQ - dosed in mg/24 hours) patch 21 mg (21 mg Transdermal Patch Applied 09/11/22 1706)  acetaminophen (TYLENOL) tablet 650 mg (650 mg Oral Given 09/11/22 1347)  oxyCODONE-acetaminophen (PERCOCET/ROXICET) 5-325 MG per tablet 1 tablet (1 tablet Oral Given 09/11/22 1703)  ibuprofen (ADVIL) tablet 600 mg (  600 mg Oral Given 09/11/22 1703)  LORazepam (ATIVAN) tablet 1 mg (1 mg Oral Given 09/11/22 1808)  gadobutrol (GADAVIST) 1 MMOL/ML injection 6 mL (6 mLs Intravenous Contrast Given 09/11/22 1832)    ED Course/ Medical Decision Making/ A&P Clinical Course as of 09/11/22 1956  Thu Sep 11, 2022  1326 Labs reviewed.  No significant abnormalities. [id number] CT scan without acute findings. [JK]  77 MRI shows age advanced findings suggestive of accelerated chronic microvascular ischemia or edema myelinating process. [JK]  69 Dr. Rory Percy of neurology who recommended MRI brain w/ contrast to further evaluate for demyelinating disease and MRI orbits. [VK]    Clinical Course User Index [JK] Dorie Rank, MD [VK] Kemper Durie, DO                             Medical Decision Making Amount and/or Complexity of Data Reviewed Labs: ordered. Radiology: ordered.  Risk OTC drugs. Prescription drug management.   Pt presented with several  days of vision changes.  Sx concerning for possible stroke, MS.   Ocular etiology also a concern.  No other focal deficits noted on exam.  CT without acute findings.  MRI ordered for further evaluation.  MRI notable ischemia, vs demyelinating process.  Plan on discussion with neurology regarding further workup.  Case turned over at shift change.        Final Clinical Impression(s) / ED Diagnoses Final diagnoses:  None    Rx / DC Orders ED Discharge Orders     None         Dorie Rank, MD 09/11/22 [phone]

## 2022-09-12 ENCOUNTER — Encounter: Payer: 59 | Admitting: Orthopedic Surgery

## 2022-09-12 LAB — URINE CULTURE

## 2022-09-12 MED ORDER — NITROFURANTOIN MONOHYD MACRO 100 MG PO CAPS: 100.0000 mg | ORAL_CAPSULE | Freq: Two times a day (BID) | ORAL | 0 refills | Status: AC

## 2022-09-12 NOTE — Addendum Note (Signed)
Addended by: Riesa Pope on: 09/12/2022 04:16 PM   Modules accepted: Orders

## 2022-09-12 NOTE — ED Notes (Signed)
Pt provided with AVS.  Education complete; all questions answered.  Pt leaving ED in stable condition at this time via wheelchair with all belongings.

## 2022-09-12 NOTE — Addendum Note (Signed)
Addended by: Charise Killian on: 09/12/2022 08:57 AM   Modules accepted: Level of Service

## 2022-09-12 NOTE — Progress Notes (Signed)
Addendum: UA with leukocytes, nitrites, WBCS and many bacteria. Culture with over 100,000 of gram negative rods, final grew e. Coli with no resistance. Will send in 100 mg Macrobid BID for 5 days.   Follow up abnormal MRI findings with non-specific hyperintensity's. Neurology did not see any reason for admission to have her follow up with ophthalmology. Will also place referral to neurology for further evaluation.

## 2022-09-12 NOTE — Progress Notes (Signed)
Internal Medicine Clinic Attending  Case discussed with Dr. Katsadouros  At the time of the visit.  We reviewed the resident's history and exam and pertinent patient test results.  I agree with the assessment, diagnosis, and plan of care documented in the resident's note.  

## 2022-09-19 ENCOUNTER — Encounter: Payer: Self-pay | Admitting: Orthopedic Surgery

## 2022-09-19 ENCOUNTER — Encounter: Payer: Self-pay | Admitting: Student

## 2022-09-21 NOTE — Progress Notes (Unsigned)
[facility]  PATIENT: Brianna Bowman DOB: 1970-07-22  REFERRING DOCTOR OR PCP: Leigh Aurora DO; Charise Killian, MD SOURCE: Patient, emergency room notes, imaging and lab reports, MRI images personally reviewed.  _________________________________   HISTORICAL  CHIEF COMPLAINT:  Chief Complaint  Patient presents with   Room 10    Pt is here with her Husband. Pt states that she gets 2 migraines per week. Pt states that she has nausea with her migraines. Pt states that she has vertigo. Pt states she can lay down and the room feels like its spinning. Pt states that she has light and sound sensitivity.     HISTORY OF PRESENT ILLNESS:  Brianna Bowman, at [facility] for neurologic consultation regarding her visual changes and headaches and abnormal brain MRI.  She is a 52 year old woman who presented to the emergency room 09/11/2022 with central left visual field changes and headache for the preceding 6 days.    Since discharge, she continue to have headache.  A couple days later she started to note positional   dizziness (vertigo > lightheaded) is worse.   When she stands she feels dizzy.    She also feels vertigo that worsens if she stands and sometimes rolling over in bed.   Of note, several years ago she had similar visual changes on the right but did not seek medical attention.   She did not have other symptoms at that time.     She has migraines 3-4 times a month. With migraines, she has Nausea, photo and phonophobia.    Pain is pounding.  When one occurs she sometimes takes [place] with benefit.   Sumatriptan had not helped.    Cymbalta and NSAIDs have not helped prevent the migraines  She just saw Dr. Katy Fitch of ophthalmology and she was diagnosed with cataracts but was told no internal eye abnormality.  She broke her right ankle going down a slide.       Her father had MS and PGM had ALS.   She smokes.   BP is usually normal or low but never high.      In 2007, she reportedly had a reaction to anesthetics during delivery and her temperature shot up to 105.  She does not recall being told that blood pressure was elevated or that she had eclampsia.  Imaging: MRI of the brain with and without 09/11/2022 shows extensive fairly symmetric T2/FLAIR hyperintense foci predominantly in the subcortical and deep white matter of the cerebral hemispheres.  No callososeptal or infratentorial foci were noted.  Normal enhancement pattern.  MRI of the orbits 09/11/2022 was normal.   Laboratory: Urinalysis and culture showed E. coli UTI.  CMP, CBC and differential were normal.    REVIEW OF SYSTEMS: Constitutional: No fevers, chills, sweats, or change in appetite Eyes: No visual changes, double vision, eye pain Ear, nose and throat: No hearing loss, ear pain, nasal congestion, sore throat Cardiovascular: No chest pain, palpitations Respiratory:  No shortness of breath at rest or with exertion.   No wheezes GastrointestinaI: No nausea, vomiting, diarrhea, abdominal pain, fecal incontinence Genitourinary:  No dysuria, urinary retention or frequency.  No nocturia. Musculoskeletal:  No neck pain, back pain.  She currently has an ankle fracture on the right and had 1 on the left a few years ago. Integumentary: No rash, pruritus, skin lesions Neurological: as above Psychiatric: No depression at this time.  Has some anxiety.  This is worsened over the last 2 weeks. Endocrine:  No palpitations, diaphoresis, change in appetite, change in weigh or increased thirst Hematologic/Lymphatic:  No anemia, purpura, petechiae. Allergic/Immunologic: No itchy/runny eyes, nasal congestion, recent allergic reactions, rashes  ALLERGIES: Allergies  Allergen Reactions   Amoxicillin Anaphylaxis    Has patient had a PCN reaction causing immediate rash, facial/tongue/throat swelling, SOB or lightheadedness with hypotension: Yes Has patient had a PCN reaction causing severe rash  involving mucus membranes or skin necrosis: No Has patient had a PCN reaction that required hospitalization: No Has patient had a PCN reaction occurring within the last 10 years: Yes If all of the above answers are "NO", then may proceed with Cephalosporin use.    Adhesive [Tape] Hives   Aloe Hives   Shellfish Allergy Rash    Lobster    HOME MEDICATIONS:  Current Outpatient Medications:    aspirin EC 81 MG tablet, Take 81-325 mg by mouth every 6 (six) hours as needed for moderate pain. Swallow whole., Disp: , Rfl:    diclofenac Sodium (VOLTAREN ARTHRITIS PAIN) 1 % GEL, Apply 2 g topically 4 (four) times daily. To left ankle, Disp: 4 g, Rfl: 3   LORazepam (ATIVAN) 1 MG tablet, Take 1 tablet (1 mg total) by mouth every 8 (eight) hours., Disp: 30 tablet, Rfl: 0   meloxicam (MOBIC) 7.5 MG tablet, Take 1 tablet (7.5 mg total) by mouth daily as needed for pain., Disp: 30 tablet, Rfl: 2   Ubrogepant (UBRELVY) 100 MG TABS, One po qd prn migrain, Disp: 10 tablet, Rfl: 5   busPIRone (BUSPAR) 5 MG tablet, Take 1 tablet (5 mg total) by mouth 3 (three) times daily. (Patient not taking: Reported on 09/22/2022), Disp: 90 tablet, Rfl: 1   DULoxetine (CYMBALTA) 30 MG capsule, Take 1 capsule (30 mg total) by mouth daily. (Patient not taking: Reported on 09/22/2022), Disp: 30 capsule, Rfl: 2  PAST MEDICAL HISTORY: Past Medical History:  Diagnosis Date   Anemia    with pregnancy   Anxiety    due to condition   Depression    Headache(784.0)    history of   PONV (postoperative nausea and vomiting)    with epidural anad 04/28/13 BP low and took longer to wake after surgery 04/28/2012    PAST SURGICAL HISTORY: Past Surgical History:  Procedure Laterality Date   FRACTURE SURGERY     L femur   HARDWARE REMOVAL Left 11/01/2012   Procedure: HARDWARE REMOVAL;  Surgeon: Marybelle Killings, MD;  Location: [place];  Service: Orthopedics;  Laterality: Left;  Left Ankle Removal Fibula Plate and 10 screws, Irrigation and  Debridement   MANDIBLE FRACTURE SURGERY     ORIF ANKLE FRACTURE  04/28/2012   Procedure: OPEN REDUCTION INTERNAL FIXATION (ORIF) ANKLE FRACTURE;  Surgeon: Marybelle Killings, MD;  Location: [facility];  Service: Orthopedics;  Laterality: Left;  Open reduction internal fixation left ankle bimalleolar fracture    FAMILY HISTORY: Family History  Problem Relation Age of Onset   Multiple sclerosis Father    Heart attack Maternal Grandfather        Died in his 91s    SOCIAL HISTORY: Social History   Socioeconomic History   Marital status: Legally Separated    Spouse name: Not on file   Number of children: Not on file   Years of education: Not on file   Highest education level: Not on file  Occupational History   Not on file  Tobacco Use   Smoking status: Every Day    Packs/day: 1.00  Years: 20.00    Additional pack years: 0.00    Total pack years: 20.00    Types: Cigarettes   Smokeless tobacco: Never  Substance and Sexual Activity   Alcohol use: Yes    Comment: occ   Drug use: Never   Sexual activity: Not on file  Other Topics Concern   Not on file  Social History Narrative   Right Handed   1 cup of Coffee per day   2 Cans of soda   Social Determinants of Health   Financial Resource Strain: Low Risk  (08/12/2022)   Overall Financial Resource Strain (CARDIA)    Difficulty of Paying Living Expenses: Not hard at all  Food Insecurity: No Food Insecurity (08/12/2022)   Hunger Vital Sign    Worried About Running Out of Food in the Last Year: Never true    Ran Out of Food in the Last Year: Never true  Transportation Needs: No Transportation Needs (08/12/2022)   PRAPARE - Hydrologist (Medical): No    Lack of Transportation (Non-Medical): No  Physical Activity: Sufficiently Active (08/12/2022)   Exercise Vital Sign    Days of Exercise per Week: 7 days    Minutes of Exercise per Session: 60 min  Stress: Stress Concern Present (08/12/2022)   [place]    Feeling of Stress : Very much  Social Connections: Moderately Isolated (08/12/2022)   Social Connection and Isolation Panel [NHANES]    Frequency of Communication with Friends and Family: More than three times a week    Frequency of Social Gatherings with Friends and Family: More than three times a week    Attends Religious Services: Never    Marine scientist or Organizations: No    Attends Archivist Meetings: Never    Marital Status: Married  Human resources officer Violence: Not At Risk (08/12/2022)   Humiliation, Afraid, Rape, and Kick questionnaire    Fear of Current or Ex-Partner: No    Emotionally Abused: No    Physically Abused: No    Sexually Abused: No       PHYSICAL EXAM  Vitals:   09/22/22 1136  BP: 111/73  Pulse: 92    There is no height or weight on file to calculate BMI.   General: The patient is well-developed and well-nourished and in no acute distress  HEENT:  Head is [place]/AT.  Sclera are anicteric.    Neck: No carotid bruits are noted.  The neck is nontender.  Cardiovascular: The heart has a regular rate and rhythm with a normal S1 and S2. There were no murmurs, gallops or rubs.    Skin: Extremities are without rash or  edema.  Musculoskeletal/extremities: Right ankle was casted\  Neurologic Exam  Mental status: The patient is alert and oriented x 3 at the time of the examination. The patient has apparent normal recent and remote memory, with an apparently normal attention span and concentration ability.   Speech is normal.  Cranial nerves: Extraocular movements are full. Pupils are equal, round, and reactive to light and accomodation.  Visual fields are full.  Facial symmetry is present. There is good facial sensation to soft touch bilaterally.Facial strength is normal.  Trapezius and sternocleidomastoid strength is normal. No dysarthria is noted.  The tongue is midline,  and the patient has symmetric elevation of the soft palate. No obvious hearing deficits are noted.  Weber sign did not localize.  Motor:  Muscle bulk is normal.   Tone is normal. Strength is  5 / 5 in all 4 extremities.   Sensory: Sensory testing is intact to pinprick, soft touch and vibration sensation in all 4 extremities.  Coordination: Cerebellar testing reveals good finger-nose-finger and heel-to-shin bilaterally.  Gait and station: Station is normal.   Gait is normal. Tandem gait is normal. Romberg is negative.   Reflexes: Deep tendon reflexes are symmetric and normal bilaterally (right leg not tested due to fracture).   Plantar responses are flexor.  Dix-Hallpike maneuver did not elicit nystagmus or vertigo.    DIAGNOSTIC DATA (LABS, IMAGING, TESTING) - I reviewed patient records, labs, notes, testing and imaging myself where available.  Lab Results  Component Value Date   WBC 5.4 09/11/2022   HGB 14.3 09/11/2022   HCT 42.0 09/11/2022   MCV 98.4 09/11/2022   PLT 347 09/11/2022      Component Value Date/Time   NA 140 09/11/2022 1228   NA 138 08/12/2022 1647   K 4.2 09/11/2022 1228   CL 105 09/11/2022 1228   CO2 25 09/11/2022 1130   GLUCOSE 89 09/11/2022 1228   BUN 7 09/11/2022 1228   BUN 10 08/12/2022 1647   CREATININE 0.70 09/11/2022 1228   CALCIUM 9.5 09/11/2022 1130   PROT 7.0 09/11/2022 1130   ALBUMIN 3.9 09/11/2022 1130   AST 24 09/11/2022 1130   ALT 25 09/11/2022 1130   ALKPHOS 57 09/11/2022 1130   BILITOT 0.5 09/11/2022 1130   GFRNONAA >60 09/11/2022 1130   GFRAA >60 02/10/2018 0305   Lab Results  Component Value Date   CHOL 284 (H) 08/12/2022   HDL 38 (L) 08/12/2022   LDLCALC 211 (H) 08/12/2022   TRIG 180 (H) 08/12/2022   CHOLHDL 7.5 (H) 08/12/2022   Lab Results  Component Value Date   HGBA1C 5.6 08/12/2022   No results found for: "VITAMINB12" Lab Results  Component Value Date   TSH 1.570 08/12/2022       ASSESSMENT AND PLAN  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 Nieder is a 52 year old woman who presented to the emergency room with visual changes and headache 2 weeks ago and developed vertigo a couple days after discharge.  The MRI of the brain showed extensive T2/FLAIR hyperintense changes in the cerebral hemispheres.  There is no infratentorial change.  The pattern is unusual as it is fairly symmetric and involves the occipital lobes greater than parietal and posterior frontal lobes mostly.  It is not typical for demyelination as would be seen with multiple sclerosis.  She has not had neurologic dysfunction and a genetic leukodystrophy, though possible, is not very likely.  The pattern is not typical for CADASIL.  As ophthalmologic evaluation did not show retinal vasculopathy, Susac's. disease is unlikely to be an explanation.  The greater involvement of the occipital lobes would be worrisome for PRES.  She did note an episode of hyperthermia after receiving some medication during delivery in 2007 but does not recall being told that her blood pressure was elevated and does not recall being told that she had eclampsia.  She also reports that she was slow in talking though motor skills were normal.  It is possible that a neonatal injury or problems or her 2007 pregnancy could be related to the white matter changes.  Because the MRI is atypical  for MS and she does not have symptoms consistent with MS, I will hold off on checking a lumbar puncture.  I would like to recheck an MRI later this year or early next year to determine if there is progression.  Although the vertigo is positional, the Dix-Hallpike maneuver was negative.  I did instruct her on the Brandt-Daroff exercises.   I do not think meclizine would be too helpful.  For migraine she should take Ubrelvy near the onset as that seems to be effective for  her.  If the frequency increases we will need to consider prophylactic treatment.  Advised to start the prescribed buspirone for her anxiety.  Vies to quit smoking and we discussed that since the MRI of the brain shows "damage" that it is important that she eliminate risk factors that may cause more issues over time for the brain.  She will return to see me in 8 months or sooner if there are new or worsening neurologic symptoms.  Thank you for asking me to see Ms. Picone.  Please let me know if I can be of further assistance with her or other patients.   Frimy Uffelman A. Felecia Shelling, MD, [facility] [id number], [id number] PM Certified in Neurology, Clinical Neurophysiology, Sleep Medicine and Neuroimaging  [facility] [address], [place], [place] [address] [phone]

## 2022-09-22 ENCOUNTER — Encounter: Payer: Self-pay | Admitting: Neurology

## 2022-09-22 ENCOUNTER — Ambulatory Visit: Payer: 59 | Admitting: Neurology

## 2022-09-22 VITALS — BP 111/73 | HR 92

## 2022-09-22 DIAGNOSIS — R9082 White matter disease, unspecified: Secondary | ICD-10-CM | POA: Diagnosis not present

## 2022-09-22 DIAGNOSIS — R42 Dizziness and giddiness: Secondary | ICD-10-CM | POA: Diagnosis not present

## 2022-09-22 DIAGNOSIS — G43709 Chronic migraine without aura, not intractable, without status migrainosus: Secondary | ICD-10-CM | POA: Diagnosis not present

## 2022-09-22 DIAGNOSIS — G3180 Leukodystrophy, unspecified: Secondary | ICD-10-CM

## 2022-09-22 DIAGNOSIS — Z720 Tobacco use: Secondary | ICD-10-CM

## 2022-09-22 MED ORDER — UBRELVY 100 MG PO TABS: ORAL_TABLET | ORAL | 5 refills | Status: DC

## 2022-09-25 ENCOUNTER — Ambulatory Visit (INDEPENDENT_AMBULATORY_CARE_PROVIDER_SITE_OTHER): Payer: 59 | Admitting: Orthopedic Surgery

## 2022-09-25 ENCOUNTER — Other Ambulatory Visit (INDEPENDENT_AMBULATORY_CARE_PROVIDER_SITE_OTHER): Payer: 59

## 2022-09-25 DIAGNOSIS — S82891D Other fracture of right lower leg, subsequent encounter for closed fracture with routine healing: Secondary | ICD-10-CM

## 2022-09-25 MED ORDER — LORAZEPAM 1 MG PO TABS: 1.0000 mg | ORAL_TABLET | Freq: Three times a day (TID) | ORAL | 0 refills | Status: DC | PRN

## 2022-09-25 MED ORDER — OXYCODONE HCL 5 MG PO TABS: 2.5000 mg | ORAL_TABLET | ORAL | 0 refills | Status: AC | PRN

## 2022-09-25 NOTE — Progress Notes (Signed)
Referring-Brianna Allena Katz, DO Reason for referral-chest tightness  HPI: 52 year old female for evaluation of chest pain at request of Modena Slater, DO.  Cardiac CTA August 2019 showed calcium score less than 1 isolated area in the mid LAD, nonobstructive coronary disease including less than 30% in the mid LAD, less than 30% in the ramus intermedius and less than 30% in the proximal and mid circumflex..  Patient states she has had panic attacks for approximately 4 years.  They are sudden in onset with pain in her jaw followed by pain in her left breast area radiating to her left upper extremity.  There is associated dyspnea and diaphoresis.  Symptoms typically last up to 15 minutes and resolve spontaneously.  She otherwise has dyspnea with more vigorous activities but not routine activities.  No orthopnea, PND or pedal edema.  She does not have exertional chest pain.  Note these are the symptoms she had that prompted the above cardiac CTA.  Cardiology now asked to evaluate.  Current Outpatient Medications  Medication Sig Dispense Refill   diclofenac Sodium (VOLTAREN ARTHRITIS PAIN) 1 % GEL Apply 2 g topically 4 (four) times daily. To left ankle 4 g 3   LORazepam (ATIVAN) 1 MG tablet Take 1 tablet (1 mg total) by mouth every 8 (eight) hours as needed for anxiety. 20 tablet 0   meloxicam (MOBIC) 7.5 MG tablet Take 1 tablet (7.5 mg total) by mouth daily as needed for pain. 30 tablet 2   Ubrogepant (UBRELVY) 100 MG TABS One po qd prn migrain 10 tablet 5   aspirin EC 81 MG tablet Take 81-325 mg by mouth every 6 (six) hours as needed for moderate pain. Swallow whole.     busPIRone (BUSPAR) 5 MG tablet Take 1 tablet (5 mg total) by mouth 3 (three) times daily. (Patient not taking: Reported on 09/22/2022) 90 tablet 1   DULoxetine (CYMBALTA) 30 MG capsule Take 1 capsule (30 mg total) by mouth daily. (Patient not taking: Reported on 09/22/2022) 30 capsule 2   No current facility-administered medications for this  visit.    Allergies  Allergen Reactions   Amoxicillin Anaphylaxis    Has patient had a PCN reaction causing immediate rash, facial/tongue/throat swelling, SOB or lightheadedness with hypotension: Yes Has patient had a PCN reaction causing severe rash involving mucus membranes or skin necrosis: No Has patient had a PCN reaction that required hospitalization: No Has patient had a PCN reaction occurring within the last 10 years: Yes If all of the above answers are "NO", then may proceed with Cephalosporin use.    Adhesive [Tape] Hives   Aloe Hives   Shellfish Allergy Rash    Lobster     Past Medical History:  Diagnosis Date   Anemia    with pregnancy   Anxiety    due to condition   Depression    Headache(784.0)    history of   Hyperlipidemia    PONV (postoperative nausea and vomiting)    with epidural anad 04/28/13 BP low and took longer to wake after surgery 04/28/2012    Past Surgical History:  Procedure Laterality Date   FRACTURE SURGERY     L femur   HARDWARE REMOVAL Left 11/01/2012   Procedure: HARDWARE REMOVAL;  Surgeon: Eldred Manges, MD;  Location: [facility];  Service: Orthopedics;  Laterality: Left;  Left Ankle Removal Fibula Plate and 10 screws, Irrigation and Debridement   MANDIBLE FRACTURE SURGERY     ORIF ANKLE FRACTURE  04/28/2012   Procedure: OPEN REDUCTION INTERNAL FIXATION (ORIF) ANKLE FRACTURE;  Surgeon: Eldred Manges, MD;  Location: [facility] OR;  Service: Orthopedics;  Laterality: Left;  Open reduction internal fixation left ankle bimalleolar fracture    Social History   Socioeconomic History   Marital status: Married    Spouse name: Not on file   Number of children: Not on file   Years of education: Not on file   Highest education level: Not on file  Occupational History   Not on file  Tobacco Use   Smoking status: Every Day    Packs/day: 1.00    Years: 20.00    Additional pack years: 0.00    Total pack years: 20.00    Types: Cigarettes   Smokeless  tobacco: Never  Substance and Sexual Activity   Alcohol use: Yes    Comment: Occasional   Drug use: Never   Sexual activity: Not on file  Other Topics Concern   Not on file  Social History Narrative   Right Handed   1 cup of Coffee per day   2 Cans of soda   Social Determinants of Health   Financial Resource Strain: Low Risk  (08/12/2022)   Overall Financial Resource Strain (CARDIA)    Difficulty of Paying Living Expenses: Not hard at all  Food Insecurity: No Food Insecurity (08/12/2022)   Hunger Vital Sign    Worried About Running Out of Food in the Last Year: Never true    Ran Out of Food in the Last Year: Never true  Transportation Needs: No Transportation Needs (08/12/2022)   PRAPARE - Administrator, Civil Service (Medical): No    Lack of Transportation (Non-Medical): No  Physical Activity: Sufficiently Active (08/12/2022)   Exercise Vital Sign    Days of Exercise per Week: 7 days    Minutes of Exercise per Session: 60 min  Stress: Stress Concern Present (08/12/2022)   [employer] of Occupational Health - Occupational Stress Questionnaire    Feeling of Stress : Very much  Social Connections: Moderately Isolated (08/12/2022)   Social Connection and Isolation Panel [NHANES]    Frequency of Communication with Friends and Family: More than three times a week    Frequency of Social Gatherings with Friends and Family: More than three times a week    Attends Religious Services: Never    Database administrator or Organizations: No    Attends Banker Meetings: Never    Marital Status: Married  Catering manager Violence: Not At Risk (08/12/2022)   Humiliation, Afraid, Rape, and Kick questionnaire    Fear of Current or Ex-Partner: No    Emotionally Abused: No    Physically Abused: No    Sexually Abused: No    Family History  Problem Relation Age of Onset   Cancer Mother    Multiple sclerosis Father    Pneumonia Father    Heart attack Maternal  Grandfather        Died in his 20s    ROS: Pain from recent ankle fracture but no fevers or chills, productive cough, hemoptysis, dysphasia, odynophagia, melena, hematochezia, dysuria, hematuria, rash, seizure activity, orthopnea, PND, pedal edema, claudication. Remaining systems are negative.  Physical Exam:   Blood pressure 110/68, pulse 99, height 5\' 3"  (1.6 m), SpO2 98 %.  General:  Well developed/well nourished in NAD Skin warm/dry Patient not depressed No peripheral clubbing Back-normal HEENT-normal/normal eyelids Neck supple/normal carotid upstroke bilaterally; no bruits; no JVD; no  thyromegaly chest - CTA/ normal expansion CV - RRR/normal S1 and S2; no murmurs, rubs or gallops;  PMI nondisplaced Abdomen -NT/ND, no HSM, no mass, + bowel sounds, no bruit 2+ femoral pulses, no bruits Ext-no edema, chords, 2+ DP; right ankle in brace from recent fracture. Neuro-grossly nonfocal  ECG -normal sinus rhythm at a rate of 99, nonspecific ST changes.  Low voltage.  Personally reviewed  A/P  1 chest pain-symptoms are atypical.  She has had these intermittently for 4 years and previous cardiac CTA showed nonobstructive coronary disease.  She feels these may be anxiety related.  She does not have exertional chest pain.  She has dyspnea with more vigorous activities.  I will arrange an echocardiogram to assess LV function.  If normal and no wall motion abnormalities we will not pursue further ischemia evaluation at this point.  2 tobacco abuse-patient counseled on discontinuing.  3 hyperlipidemia-patient has severe hyperlipidemia.  Laboratories from February 2024 showed total cholesterol 284, triglycerides 180, HDL 38 and LDL 211.  I recommended statin therapy today.  However she declines at this point.  She would like to try diet and will consider in the future.  She will contact me if she is agreeable.  4 coronary artery disease-continue aspirin.  I again recommended a statin as outlined  above.  She will contact us if she is agreeable.   Olga Millers, MD

## 2022-09-25 NOTE — Progress Notes (Signed)
Orthopedic Surgery Post-operative Office Visit   Procedure: Right ankle closed reduction and application of short leg splint Date of Injury: 08/23/2022 (~4.5 weeks from injury)   Assessment: Patient is a 52 y.o. who has a right ankle fracture/dislocation who is maintaining alignment in the splint and pain is decreasing with time     Plan: -No acute operative plans -Nonweightbearing right lower extremity in splint -Patient was uncomfortable in the splint was having issues filling her skin with rubbing, so splint was taken down today.  I had explained that I usually keep people in a splint x6 weeks, but she wanted to transition to a boot so she was placed into a boot today -Pain control: weaning oxycodone, new prescription provided today -Will discontinue all medications at next visit and she can use over-the-counter medications -Return to office in 2 weeks, x-rays needed at next visit: right ankle AP/lateral/mortise out of boot   ___________________________________________________________________________     Subjective: Patient injured her ankle while on a slide on 08/23/2022.  She was not having much ankle pain at her last visit, but within the last week has noted pain over the sides of her ankle around the splint.  She states she feels that her skin is getting rubbed raw by the splint.  No other pain around the ankle or foot.  Denies paresthesias and numbness.   Objective:   General: no acute distress, appropriate affect Neurologic: alert, answering questions appropriately, following commands Respiratory: unlabored breathing on room air Skin: No pressure ulcer seen, no skin breakdown over the medial/lateral malleolus or the heel   MSK (RLE):             No tenderness to palpation over the ankle             Cap refill less than 2 seconds in the toes, palpable DP pulse             Sensation intact to light touch in sural/saphenous/deep peroneal/superficial peroneal/tibial nerve  distributions             Plantar flexes and dorsiflexes toes   XRs of the right ankle taken 09/25/2022 were independently reviewed and interpreted, showing a bimalleolar ankle fracture.  Talus is still under the tibia.  Minimally displaced lateral malleolus and medial malleolus fracture.  Reduction is not anatomic, but appears similar to reduction since the ER visit.  No new fracture seen.  No significant callus formation seen.     Patient name: Brianna Bowman Patient MRN: [id number] Date of visit: 09/25/22

## 2022-09-30 ENCOUNTER — Ambulatory Visit (INDEPENDENT_AMBULATORY_CARE_PROVIDER_SITE_OTHER): Payer: Self-pay | Admitting: Licensed Clinical Social Worker

## 2022-09-30 DIAGNOSIS — S82851A Displaced trimalleolar fracture of right lower leg, initial encounter for closed fracture: Secondary | ICD-10-CM

## 2022-09-30 NOTE — BH Specialist Note (Signed)
Integrated Behavioral Health via Telemedicine Visit  09/30/2022 Brianna Bowman [id number]  Number of Integrated Behavioral Health Clinician visits: No data recorded Session Start time: 1400   Session End time: 1430  Total time in minutes: 30   Referring Provider: Dickie La, MD Patient/Family location: Home [facility] Provider location: Office All persons participating in visit: [facility] and Patient Types of Service: Other (Comment)  I connected with Brianna Bowman via  Telephone or Engineer, civil (consulting)  (Video is Caregility application) and verified that I am speaking with the correct person using two identifiers. Discussed confidentiality: Yes   I discussed the limitations of telemedicine and the availability of in person appointments.  Discussed there is a possibility of technology failure and discussed alternative modes of communication if that failure occurs.  I discussed that engaging in this telemedicine visit, they consent to the provision of behavioral healthcare and the services will be billed under their insurance.  Patient and/or legal guardian expressed understanding and consented to Telemedicine visit: Yes    Assessment: Patient currently experiencing sadness due to recent injury. Patient states she is accustom to being mobile and her recent injury to her ankle has prevented her from her usual day to day activities. Patient states she is surrounded by support from her family and friends. Patient states she has everything she needs and is doing well. [facility] gave patient contact information and advised patient to contact [facility] if needed in future.  No follow up is needed at this time.    I discussed the assessment and treatment plan with the patient and/or parent/guardian. They were provided an opportunity to ask questions and all were answered. They agreed with the plan and demonstrated an understanding of the instructions.   They were advised to call back or seek  an in-person evaluation if the symptoms worsen or if the condition fails to improve as anticipated.  Christen Butter, MSW, LCSW-A She/Her Behavioral Health Clinician [facility]  Internal Medicine Center Direct Dial:[phone]  Fax [phone] Main Office Phone: [phone] [address] [place] [place], [place] [address] Website: [facility] Internal Medicine [facility]  [place], [place]  [place]

## 2022-10-02 ENCOUNTER — Encounter: Payer: Self-pay | Admitting: Cardiology

## 2022-10-02 ENCOUNTER — Ambulatory Visit: Payer: 59 | Attending: Cardiology | Admitting: Cardiology

## 2022-10-02 VITALS — BP 110/68 | HR 99 | Ht 63.0 in

## 2022-10-02 DIAGNOSIS — Z720 Tobacco use: Secondary | ICD-10-CM

## 2022-10-02 DIAGNOSIS — I2510 Atherosclerotic heart disease of native coronary artery without angina pectoris: Secondary | ICD-10-CM | POA: Diagnosis not present

## 2022-10-02 DIAGNOSIS — R072 Precordial pain: Secondary | ICD-10-CM | POA: Diagnosis not present

## 2022-10-02 DIAGNOSIS — E7800 Pure hypercholesterolemia, unspecified: Secondary | ICD-10-CM

## 2022-10-02 NOTE — Patient Instructions (Signed)
  Testing/Procedures:  Your physician has requested that you have an echocardiogram. Echocardiography is a painless test that uses sound waves to create images of your heart. It provides your doctor with information about the size and shape of your heart and how well your heart's chambers and valves are working. This procedure takes approximately one hour. There are no restrictions for this procedure. Please do NOT wear cologne, perfume, aftershave, or lotions (deodorant is allowed). Please arrive 15 minutes prior to your appointment time. [address]   Follow-Up: At [facility], you and your health needs are our priority.  As part of our continuing mission to provide you with exceptional heart care, we have created designated Provider Care Teams.  These Care Teams include your primary Cardiologist (physician) and Advanced Practice Providers (APPs -  Physician Assistants and Nurse Practitioners) who all work together to provide you with the care you need, when you need it.  We recommend signing up for the patient portal called "MyChart".  Sign up information is provided on this After Visit Summary.  MyChart is used to connect with patients for Virtual Visits (Telemedicine).  Patients are able to view lab/test results, encounter notes, upcoming appointments, etc.  Non-urgent messages can be sent to your provider as well.   To learn more about what you can do with MyChart, go to [email or web address].    Your next appointment:   12 month(s)  Provider:   BRIAN CRENSHAW MD    

## 2022-10-03 ENCOUNTER — Ambulatory Visit (HOSPITAL_COMMUNITY): Payer: 59 | Attending: Cardiology

## 2022-10-03 DIAGNOSIS — R072 Precordial pain: Secondary | ICD-10-CM

## 2022-10-03 LAB — ECHOCARDIOGRAM COMPLETE
Area-P 1/2: 3.99 cm2
Calc EF: 54.6 %
S' Lateral: 2.7 cm
Single Plane A2C EF: 56.3 %
Single Plane A4C EF: 55.7 %

## 2022-10-06 ENCOUNTER — Ambulatory Visit: Payer: 59

## 2022-10-09 ENCOUNTER — Other Ambulatory Visit (INDEPENDENT_AMBULATORY_CARE_PROVIDER_SITE_OTHER): Payer: 59

## 2022-10-09 ENCOUNTER — Ambulatory Visit (INDEPENDENT_AMBULATORY_CARE_PROVIDER_SITE_OTHER): Payer: 59 | Admitting: Orthopedic Surgery

## 2022-10-09 DIAGNOSIS — S82891D Other fracture of right lower leg, subsequent encounter for closed fracture with routine healing: Secondary | ICD-10-CM

## 2022-10-09 NOTE — Progress Notes (Signed)
Orthopedic Surgery Office Visit   Procedure: Right ankle closed reduction and application of short leg splint Date of Injury: 08/23/2022 (~6 weeks from injury)   Assessment: Patient is a 52 y.o. who has a right ankle fracture/dislocation whose alignment appears similar and is not having any pain at this time     Plan: -No acute operative plans -Nonweightbearing right lower extremity in boot -Pain control: OTC medications -Told her she can come out of the boot when sitting at a chair or in bed but otherwise should be in the boot -Encouraged her to work on range of motion when she is out of the boot -Explained that she has talar tilt and slight translation of the talus so she is at risk of posttraumatic arthritis.  She expressed understanding and wants to continue with nonoperative treatment -Return to office in 3 weeks, x-rays needed at next visit: right ankle AP/lateral/mortise out of boot   ___________________________________________________________________________     Subjective: Patient injured her ankle while on a slide on 08/23/2022.  Pain is continued to decrease.  She is not having much pain at this point.  She feels more comfortable in the boot.  No paresthesias or numbness.  Has been nonweightbearing since I have last seen her.   Objective:   General: no acute distress, appropriate affect Neurologic: alert, answering questions appropriately, following commands Respiratory: unlabored breathing on room air Skin: No pressure ulcer seen, no skin breakdown over the medial/lateral malleolus or the heel   MSK (RLE):             No tenderness to palpation over the ankle             Cap refill less than 2 seconds in the toes, palpable DP pulse             Sensation intact to light touch in sural/saphenous/deep peroneal/superficial peroneal/tibial nerve distributions             Plantar flexes and dorsiflexes toes, dorsiflexes and plantar flexes the ankle   XRs of the right ankle  taken 10/09/2022 were independently reviewed and interpreted, showing a bimalleolar ankle fracture.  No callus formation seen at this point.  Talus is under the tibia but translated slightly laterally.  Minimally displaced and laterally translated lateral malleolus.  There is a displaced medial malleolus fracture. No new fracture seen.     Patient name: Brianna Bowman Patient MRN: [id number] Date of visit: 10/09/22

## 2022-10-24 ENCOUNTER — Encounter: Payer: Self-pay | Admitting: Neurology

## 2022-10-27 ENCOUNTER — Telehealth: Payer: Self-pay | Admitting: *Deleted

## 2022-10-27 NOTE — Telephone Encounter (Signed)
Pt sent mychart message stating PA Ubrelvy needed.

## 2022-10-29 ENCOUNTER — Telehealth: Payer: Self-pay

## 2022-10-29 ENCOUNTER — Other Ambulatory Visit (HOSPITAL_COMMUNITY): Payer: Self-pay

## 2022-10-29 NOTE — Telephone Encounter (Signed)
A new telephone call encounter has been made for this PA Request-please see telephone note dated 10/29/2022.

## 2022-10-29 NOTE — Telephone Encounter (Signed)
Pharmacy Patient Advocate Encounter   Received notification from [employer] that prior authorization for Ubrelvy 100 MG is required/requested.   PA submitted on 10/29/2022 to (ins) [employer] via [employer] or [facility]) confirmation # [id number] Status is pending

## 2022-10-30 ENCOUNTER — Ambulatory Visit (INDEPENDENT_AMBULATORY_CARE_PROVIDER_SITE_OTHER): Payer: 59 | Admitting: Orthopedic Surgery

## 2022-10-30 ENCOUNTER — Other Ambulatory Visit (INDEPENDENT_AMBULATORY_CARE_PROVIDER_SITE_OTHER): Payer: 59

## 2022-10-30 DIAGNOSIS — S82891D Other fracture of right lower leg, subsequent encounter for closed fracture with routine healing: Secondary | ICD-10-CM

## 2022-10-30 NOTE — Progress Notes (Signed)
Orthopedic Surgery Office Visit   Procedure: Right ankle closed reduction and application of short leg splint Date of Injury: 08/23/2022 (~9 weeks from injury)   Assessment: Patient is a 52 y.o. who has a right ankle fracture/dislocation that has been treated non-operatively. Not having any pain at this time.      Plan: -Nonweightbearing right lower extremity in boot -Pain control: OTC medications -Encouraged her to work on dorsiflexion.  Told her that she can use a towel to gently pull her foot back towards her to work on that dorsiflexion -Went over again the fact that she has talar tilt and slight translation of the talus, so she is at risk of posttraumatic arthritis. 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 She expressed understanding and wants to continue with nonoperative treatment -Will plan to transition to weight bearing in the boot as tolerated at our next visit -Return to office in 3 weeks, x-rays needed at next visit: right ankle AP/lateral/mortise weightbearing out of boot   ___________________________________________________________________________     Subjective: Patient injured her ankle while on a slide on 08/23/2022.  She is not having any pain in her ankle at this point.  She has been nonweightbearing in the boot.  Denies paresthesias and numbness.   Objective:   General: no acute distress, appropriate affect Neurologic: alert, answering questions appropriately, following commands Respiratory: unlabored breathing on room air Skin: No pressure ulcer seen, no skin breakdown over the medial/lateral malleolus or the heel   MSK (RLE):             No tenderness to palpation over the ankle             Cap refill less than 2 seconds in the toes, palpable DP pulse             Sensation intact to light touch in sural/saphenous/deep peroneal/superficial peroneal/tibial nerve distributions              Plantar flexes and dorsiflexes toes, dorsiflexes and plantar flexes the ankle  Can dorsiflex to about 3 degrees shy of neutral   XRs of the right ankle taken 10/30/2022 were independently reviewed and interpreted, showing a bimalleolar ankle fracture.  No callus formation seen at this point.  Talus is under the tibia but translated slightly laterally.  Displaced and laterally translated lateral malleolus.  There is a displaced medial malleolus fracture. No new fracture seen.     Patient name: Brianna Bowman Patient MRN: [id number] Date of visit: 10/30/22

## 2022-11-03 NOTE — Telephone Encounter (Signed)
Pharmacy Patient Advocate Encounter  Received notification from Jari Favre that the request for prior authorization for Bernita Raisin has been denied due to See below.          Please be advised we currently do not have a Pharmacist to review denials, therefore you will need to process appeals accordingly as needed. Thanks for your support at this time.  Denial letter has been placed into the chart.

## 2022-11-04 ENCOUNTER — Other Ambulatory Visit: Payer: Self-pay | Admitting: *Deleted

## 2022-11-04 DIAGNOSIS — G43709 Chronic migraine without aura, not intractable, without status migrainosus: Secondary | ICD-10-CM

## 2022-11-04 MED ORDER — RIZATRIPTAN BENZOATE 10 MG PO TABS
10.0000 mg | ORAL_TABLET | ORAL | 5 refills | Status: DC | PRN
Start: 2022-11-04 — End: 2022-11-10

## 2022-11-10 ENCOUNTER — Other Ambulatory Visit: Payer: Self-pay | Admitting: *Deleted

## 2022-11-10 ENCOUNTER — Telehealth: Payer: Self-pay

## 2022-11-10 ENCOUNTER — Encounter: Payer: Self-pay | Admitting: *Deleted

## 2022-11-10 ENCOUNTER — Other Ambulatory Visit (HOSPITAL_COMMUNITY): Payer: Self-pay

## 2022-11-10 DIAGNOSIS — G43709 Chronic migraine without aura, not intractable, without status migrainosus: Secondary | ICD-10-CM

## 2022-11-10 MED ORDER — UBRELVY 100 MG PO TABS
ORAL_TABLET | ORAL | 5 refills | Status: DC
Start: 2022-11-10 — End: 2022-11-13

## 2022-11-10 NOTE — Telephone Encounter (Signed)
Pharmacy Patient Advocate Encounter   Received notification from [facility] that prior authorization for Ubrelvy 100MG  tablets is required/requested.   PA submitted on 11/10/2022 to (ins) [employer] via [employer] or [facility]) confirmation # [id number] Status is pending

## 2022-11-12 NOTE — Telephone Encounter (Signed)
Dr. Epimenio Foot- can abortive therapy be changed to Nurtec? This is preferred option

## 2022-11-12 NOTE — Telephone Encounter (Signed)
I will try to resubmit and will send the nurse note between you and the PT documenting the reaction to the rizatriptan. Maybe that will help get it approved.

## 2022-11-12 NOTE — Telephone Encounter (Signed)
Please advise 

## 2022-11-12 NOTE — Telephone Encounter (Signed)
Pt tried rizatriptan and sumatriptan. Unsure why this was denied? She met requirements for coverage for Ubrelvy.

## 2022-11-12 NOTE — Telephone Encounter (Signed)
Pharmacy Patient Advocate Encounter  Received notification from Brianna Bowman that the request for prior authorization for Brianna Bowman has been denied due to see below.       Please be advised we currently do not have a Pharmacist to review denials, therefore you will need to process appeals accordingly as needed. Thanks for your support at this time.   You may call or fax , to appeal.    Denial letter has been placed into the chart

## 2022-11-13 MED ORDER — NURTEC 75 MG PO TBDP: 1.0000 | ORAL_TABLET | ORAL | 5 refills | Status: DC | PRN

## 2022-11-13 NOTE — Telephone Encounter (Signed)
Submitted PA Nurtec on covermymeds. Key: [id number]. Waiting on determination from [employer].

## 2022-11-13 NOTE — Telephone Encounter (Signed)
Called pt at [phone].  LVM letting her know Brianna Bowman not covered by her insurance and Nurtec called in instead to take the place of it since it is covered. I explained directions and e-scribed rx to [facility] for her. Asked her to call back if she has any questions or difficulty picking up the Nurtec.

## 2022-11-18 NOTE — Telephone Encounter (Signed)
Received fax from Jari Favre that Nurtec approved 11/15/22-05/18/23. Auth# [facility] [place] [id number]

## 2022-11-20 ENCOUNTER — Other Ambulatory Visit (INDEPENDENT_AMBULATORY_CARE_PROVIDER_SITE_OTHER): Payer: 59

## 2022-11-20 ENCOUNTER — Ambulatory Visit (INDEPENDENT_AMBULATORY_CARE_PROVIDER_SITE_OTHER): Payer: 59 | Admitting: Orthopedic Surgery

## 2022-11-20 ENCOUNTER — Other Ambulatory Visit (HOSPITAL_COMMUNITY): Payer: Self-pay

## 2022-11-20 DIAGNOSIS — S82891D Other fracture of right lower leg, subsequent encounter for closed fracture with routine healing: Secondary | ICD-10-CM | POA: Diagnosis not present

## 2022-11-20 NOTE — Progress Notes (Signed)
Orthopedic Surgery Office Visit   Procedure: Right ankle closed reduction and application of short leg splint Date of Injury: 08/23/2022 (~3 months from injury)   Assessment: Patient is a 52 y.o. who has a right ankle fracture/dislocation that has been treated non-operatively. Not having any pain at this time.      Plan: -Weight bearing as tolerated right lower extremity in boot -Will discontinue the boot at her next visit and transition her to regular shoes -Pain control: OTC medications -Continue to work on ankle range of motion, see referral provided to her today -Return to office in 4 weeks, x-rays needed at next visit: right ankle AP/lateral/mortise weightbearing out of boot   ___________________________________________________________________________     Subjective: Patient injured her ankle while on a slide on 08/23/2022.  Patient is not having any pain in her ankle.  She has put weight on the right ankle within the last week and did not notice any pain with weightbearing activity.  She said it felt weird to have her foot touch the ground though.  Denies paresthesias and numbness.   Objective:   General: no acute distress, appropriate affect Neurologic: alert, answering questions appropriately, following commands Respiratory: unlabored breathing on room air Skin: No wounds or ulcers seen   MSK (RLE):             No tenderness to palpation over the ankle             Cap refill less than 2 seconds in the toes, palpable DP pulse             Sensation intact to light touch in sural/saphenous/deep peroneal/superficial peroneal/tibial nerve distributions             Plantar flexes and dorsiflexes toes, dorsiflexes and plantar flexes the ankle             ROM at the ankle from 3-30   XRs of the right ankle taken 11/20/2022 were independently reviewed and interpreted, showing a bimalleolar ankle fracture. No callus formation seen at this point.  Talus is under the tibia but translated  slightly laterally.  Displaced and laterally translated lateral malleolus.  There is a displaced medial malleolus fracture. Alignment appears similar to prior films.     Patient name: Brianna Bowman Patient MRN: [id number] Date of visit: 11/20/22

## 2022-12-09 ENCOUNTER — Ambulatory Visit: Payer: 59

## 2022-12-11 ENCOUNTER — Ambulatory Visit: Payer: 59 | Admitting: Orthopedic Surgery

## 2022-12-11 ENCOUNTER — Encounter: Payer: Self-pay | Admitting: Student

## 2022-12-11 ENCOUNTER — Ambulatory Visit (INDEPENDENT_AMBULATORY_CARE_PROVIDER_SITE_OTHER): Payer: 59 | Admitting: Student

## 2022-12-11 VITALS — BP 111/85 | HR 88 | Temp 98.0°F

## 2022-12-11 DIAGNOSIS — T753XXD Motion sickness, subsequent encounter: Secondary | ICD-10-CM | POA: Diagnosis not present

## 2022-12-11 DIAGNOSIS — F430 Acute stress reaction: Secondary | ICD-10-CM

## 2022-12-11 MED ORDER — MECLIZINE HCL 12.5 MG PO TABS
12.5000 mg | ORAL_TABLET | Freq: Three times a day (TID) | ORAL | 0 refills | Status: DC | PRN
Start: 2022-12-11 — End: 2023-03-12

## 2022-12-11 MED ORDER — ONDANSETRON HCL 4 MG PO TABS: 4.0000 mg | ORAL_TABLET | Freq: Every day | ORAL | 0 refills | Status: DC | PRN

## 2022-12-11 NOTE — Progress Notes (Signed)
   CC: Motion sickness  HPI:  Brianna Bowman is a 52 y.o. female with PMH as below who presents to the clinic to request medication to help with motion sickness on a cruise.  Please see assessment plan for further details.  Past Medical History:  Diagnosis Date   Anemia    with pregnancy   Anxiety    due to condition   Depression    Headache(784.0)    history of   Hyperlipidemia    PONV (postoperative nausea and vomiting)    with epidural anad 04/28/13 BP low and took longer to wake after surgery 04/28/2012   Review of Systems:   Pertinent items noted in HPI and/or A&P.  Physical Exam:  Vitals:   12/11/22 1521  BP: 111/85  Pulse: 88  Temp: 98 F (36.7 C)  TempSrc: Oral  SpO2: 100%    Constitutional: Well-appearing adult female in a motorized chair. In no acute distress. HEENT: Normocephalic, atraumatic, Sclera non-icteric, PERRL, EOM intact.  Upon attempted Dix-Hallpike and head impulse test there was no significant nystagmus but patient did report dizziness prior and during exam. Pulm:Normal work of breathing on room air. ZOX:WRUEAVWU for extremity edema. Skin:Warm and dry. Neuro:Alert and oriented x3. No focal deficit noted. Psych:Pleasant mood and affect.   Assessment & Plan:   Motion sickness Patient with history of motion sickness with recurrent nausea and vomiting and triggers that include riding in the car, riding in her motorized chair, and boat travel.  She has tried scopolamine patch, Dramamine, diphenhydramine without benefit.  She presents before going on a 7-day cruise.  On a previous cruise she was given a medication for her friend that was for nausea and started with ON.  She also had an injectable medication given on the cruise by the medical staff.  These medications significantly improved her symptoms.  History and physical mildly concerning for BPPV but unable to adequately perform Dix-Hallpike although dizziness was elicited just from getting up onto  the table.  Review of ECGs as recently as 09/2022 shows no significant QT prolongation.  She is also on as needed lorazepam.  Discussed at length the interactions of lorazepam, ondansetron, and meclizine.  She agrees to not take these medications together. - Short courses of ondansetron 4 mg daily as needed, meclizine 12.5 mg 3 times daily as needed, retry scopolamine patch    Patient discussed with Dr. Alfonzo Beers, DO Internal Medicine Center Internal Medicine Resident PGY-1 Pager: [phone]

## 2022-12-11 NOTE — Patient Instructions (Signed)
  Thank you, [name], for allowing [place] to provide your care today. Today we discussed . . .  > Motion Sickness       -I have prescribed a couple medications to help with your motion sickness on the cruise.  Zofran (ondansetron) is an antinausea medication that you can take once daily.  I am also prescribing Antivert (meclizine) which is a vertigo medication that can work for motion sickness but he can take 2-3 times a day at least 8 hours apart.  Please be careful with these medications and your lorazepam.  I would recommend that you do not take these medications together and that you either take Zofran, the Antivert, or the lorazepam.   Start the following medications: Meds ordered this encounter  Medications   meclizine (ANTIVERT) 12.5 MG tablet    Sig: Take 1 tablet (12.5 mg total) by mouth 3 (three) times daily as needed for dizziness.    Dispense:  14 tablet    Refill:  0   ondansetron (ZOFRAN) 4 MG tablet    Sig: Take 1 tablet (4 mg total) by mouth daily as needed for nausea or vomiting.    Dispense:  8 tablet    Refill:  0      Should you have any questions or concerns please call the internal medicine clinic at [phone].     Rocky Morel, DO [facility]

## 2022-12-15 DIAGNOSIS — T753XXA Motion sickness, initial encounter: Secondary | ICD-10-CM | POA: Insufficient documentation

## 2022-12-15 NOTE — Assessment & Plan Note (Signed)
Patient with history of motion sickness with recurrent nausea and vomiting and triggers that include riding in the car, riding in her motorized chair, and boat travel.  She has tried scopolamine patch, Dramamine, diphenhydramine without benefit.  She presents before going on a 7-day cruise.  On a previous cruise she was given a medication for her friend that was for nausea and started with ON.  She also had an injectable medication given on the cruise by the medical staff.  These medications significantly improved her symptoms.  History and physical mildly concerning for BPPV but unable to adequately perform Dix-Hallpike although dizziness was elicited just from getting up onto the table.  Review of ECGs as recently as 09/2022 shows no significant QT prolongation.  She is also on as needed lorazepam.  Discussed at length the interactions of lorazepam, ondansetron, and meclizine.  She agrees to not take these medications together. - Short courses of ondansetron 4 mg daily as needed, meclizine 12.5 mg 3 times daily as needed, retry scopolamine patch

## 2022-12-16 ENCOUNTER — Ambulatory Visit: Payer: 59

## 2022-12-18 NOTE — Progress Notes (Signed)
Internal Medicine Clinic Attending  Case discussed with Dr. Goodwin  At the time of the visit.  We reviewed the resident's history and exam and pertinent patient test results.  I agree with the assessment, diagnosis, and plan of care documented in the resident's note.  

## 2022-12-26 ENCOUNTER — Other Ambulatory Visit (INDEPENDENT_AMBULATORY_CARE_PROVIDER_SITE_OTHER): Payer: 59

## 2022-12-26 ENCOUNTER — Ambulatory Visit (INDEPENDENT_AMBULATORY_CARE_PROVIDER_SITE_OTHER): Payer: 59 | Admitting: Orthopedic Surgery

## 2022-12-26 DIAGNOSIS — S82891D Other fracture of right lower leg, subsequent encounter for closed fracture with routine healing: Secondary | ICD-10-CM

## 2022-12-26 NOTE — Progress Notes (Signed)
Orthopedic Surgery Office Visit   Procedure: Right ankle closed reduction and application of short leg splint Date of Injury: 08/23/2022 (~4 months from injury)   Assessment: Patient is a 52 y.o. who has a right ankle fracture/dislocation that has been treated non-operatively. Not having any pain at this time.      Plan: -Weight bearing as tolerated right lower extremity, can transition to regular shoes -Pain control: OTC medications -Continue to work on ankle range of motion, see referral provided to her today -Encouraged her to do weight bearing activity and start ambulating -Starts with PT on 7/07 -Return to office in 8 weeks, x-rays needed at next visit: right ankle AP/lateral/mortise weightbearing out of boot   ___________________________________________________________________________     Subjective: Patient injured her ankle while on a slide on 08/23/2022.  Just got back from a cruise.  She said that while she was on a cruise she had a fall trying to get in the bathroom and hit her ankle.  She noted worsening of the ankle pain but that has resolved.  She does have some pain with weightbearing on the right side.  No pain at rest.  She has been able to ambulate with a boot but uses a walker for stability.   Objective:   General: no acute distress, appropriate affect Neurologic: alert, answering questions appropriately, following commands Respiratory: unlabored breathing on room air Skin: No wounds or ulcers seen   MSK (RLE):             No tenderness to palpation over the ankle             Cap refill less than 2 seconds in the toes, palpable DP pulse             Sensation intact to light touch in sural/saphenous/deep peroneal/superficial peroneal/tibial nerve distributions             Plantar flexes and dorsiflexes toes, dorsiflexes and plantar flexes the ankle             ROM at the ankle from 5-30   XRs of the right ankle taken 12/26/2022 were independently reviewed and  interpreted, showing a bimalleolar ankle fracture. No callus formation seen.  Talus is under the tibia but translated slightly laterally.  Displaced and laterally translated lateral malleolus.  There is a displaced medial malleolus fracture. Alignment appears similar to prior films. No new fractures seen.      Patient name: Brianna Bowman Patient MRN: [id number] Date of visit: 12/26/22

## 2022-12-30 ENCOUNTER — Ambulatory Visit: Payer: 59 | Attending: Orthopedic Surgery

## 2022-12-30 ENCOUNTER — Other Ambulatory Visit: Payer: Self-pay

## 2022-12-30 DIAGNOSIS — R262 Difficulty in walking, not elsewhere classified: Secondary | ICD-10-CM | POA: Diagnosis present

## 2022-12-30 DIAGNOSIS — S82891D Other fracture of right lower leg, subsequent encounter for closed fracture with routine healing: Secondary | ICD-10-CM | POA: Insufficient documentation

## 2022-12-30 DIAGNOSIS — M25671 Stiffness of right ankle, not elsewhere classified: Secondary | ICD-10-CM | POA: Diagnosis present

## 2022-12-30 DIAGNOSIS — S82891A Other fracture of right lower leg, initial encounter for closed fracture: Secondary | ICD-10-CM | POA: Insufficient documentation

## 2022-12-30 NOTE — Therapy (Signed)
OUTPATIENT PHYSICAL THERAPY LOWER EXTREMITY EVALUATION   Patient Name: Brianna Bowman MRN: [id number] DOB:08/30/1970, 52 y.o., female Today's Date: 12/30/2022  END OF SESSION:  PT End of Session - 12/30/22 1100     Visit Number 1    Date for PT Re-Evaluation 02/24/23    Progress Note Due on Visit 10    PT Start Time 1058    PT Stop Time 1145    PT Time Calculation (min) 47 min    Activity Tolerance Patient tolerated treatment well    Behavior During Therapy WFL for tasks assessed/performed             Past Medical History:  Diagnosis Date   Anemia    with pregnancy   Anxiety    due to condition   Depression    Headache(784.0)    history of   Hyperlipidemia    PONV (postoperative nausea and vomiting)    with epidural anad 04/28/13 BP low and took longer to wake after surgery 04/28/2012   Past Surgical History:  Procedure Laterality Date   FRACTURE SURGERY     L femur   HARDWARE REMOVAL Left 11/01/2012   Procedure: HARDWARE REMOVAL;  Surgeon: Eldred Manges, MD;  Location: [facility] OR;  Service: Orthopedics;  Laterality: Left;  Left Ankle Removal Fibula Plate and 10 screws, Irrigation and Debridement   MANDIBLE FRACTURE SURGERY     ORIF ANKLE FRACTURE  04/28/2012   Procedure: OPEN REDUCTION INTERNAL FIXATION (ORIF) ANKLE FRACTURE;  Surgeon: Eldred Manges, MD;  Location: [facility] OR;  Service: Orthopedics;  Laterality: Left;  Open reduction internal fixation left ankle bimalleolar fracture   Patient Active Problem List   Diagnosis Date Noted   Motion sickness 12/15/2022   White matter abnormality on MRI of brain 09/22/2022   Chronic migraine w/o aura, not intractable, w/o stat migr 09/22/2022   Opioid use 09/10/2022   Acute stress reaction 09/10/2022   Pink-colored urine 09/10/2022   Closed displaced trimalleolar fracture of lower leg, right, initial encounter 08/23/2022   Allergic reaction 08/22/2022   Screening for diabetes mellitus 08/12/2022   Hyperlipidemia 08/12/2022    Elevated blood pressure reading 08/12/2022   Need for hepatitis C screening test 08/12/2022   Encounter for screening mammogram for malignant neoplasm of breast 08/12/2022   Colon cancer screening 08/12/2022   Blurry vision 08/12/2022   Ankle pain, chronic 08/12/2022   Need for diphtheria-tetanus-pertussis (Tdap) vaccine 08/12/2022   Chest pain 02/09/2018   Anxiety state 02/09/2018   History of migraine headaches 02/09/2018   Emphysema of lung (HCC) 02/09/2018   Neuralgia 06/12/2017   Tobacco abuse 11/29/2012   Bone infection of left ankle (HCC) 11/01/2012   Closed fracture of left ankle 04/28/2012    Class: Acute    PCP: Modena Slater, MD  REFERRING PROVIDER: Willia Craze, MD  REFERRING DIAG: R trimalleolar ankle fracture  THERAPY DIAG:  Difficulty in walking, not elsewhere classified  Closed fracture of right ankle, initial encounter  Stiffness of right ankle, not elsewhere classified  Rationale for Evaluation and Treatment: Rehabilitation  ONSET DATE: 08/23/2022  SUBJECTIVE:   SUBJECTIVE STATEMENT: I haven't been able to get to where I can walk much with the boot, it pushed in the front of my leg too much  PERTINENT HISTORY: Injured R ankle on slide, did not undergo surgical fixation, now 4 months post op PAIN:  Are you having pain? Yes: NPRS scale: 2/10 Pain location: ant R ankle, R achilles tendon Pain description:  pain with walking Aggravating factors: weight bearing Relieving factors: sitting  PRECAUTIONS: Fall  WEIGHT BEARING RESTRICTIONS: Yes WBAT advance from boot to shoe gradually  FALLS:  Has patient fallen in last 6 months? Yes. Number of falls 2  LIVING ENVIRONMENT: Lives with: lives with their family Lives in: House/apartment Stairs: Yes: External: 1 .5 steps; on right going up Has following equipment at home:  knee scooter, 3 wheeled walker  OCCUPATION: disabled  PLOF: Independent with basic ADLs  PATIENT GOALS: get R ankle to heal and be  able to walk without device  NEXT MD VISIT: 5 weeks, Aug 2024  OBJECTIVE:   DIAGNOSTIC FINDINGS: XRs of the right ankle taken 12/26/2022 were independently reviewed and interpreted, showing a bimalleolar ankle fracture. No callus formation seen.  Talus is under the tibia but translated slightly laterally.  Displaced and laterally translated lateral malleolus.  There is a displaced medial malleolus fracture. Alignment appears similar to prior films. No new fractures seen.     PATIENT SURVEYS:  LEFS 14/80  COGNITION: Overall cognitive status: Within functional limits for tasks assessed     SENSATION: WFL  EDEMA:  Mild R lateral malleolus  POSTURE: weight shift left and hyperextends L knee, ER L hip and ankle  PALPATION: Non tender with palpation R ankle or achilles  LOWER EXTREMITY ROM:  Passive ROM Right eval Left eval  Hip flexion    Hip extension    Hip abduction    Hip adduction    Hip internal rotation    Hip external rotation    Knee flexion    Knee extension    Ankle dorsiflexion -15   Ankle plantarflexion 22   Ankle inversion 5   Ankle eversion 0    (Blank rows = not tested)  LOWER EXTREMITY MMT: B hips, knee musculature grossly 4/5 L ankle NT R ankle plantarflexion 3-/5 DF 3-/5 INV 3-/5   FUNCTIONAL TESTS:  NA  GAIT: Distance walked: with FWW x 100' in clnic Assistive device utilized: Environmental consultant - 2 wheeled Level of assistance: SBA Comments: cues for step to pattern and sequencing leading with R LE  Patient also using knee scooter upon arrival and to maneuver throughout clnic   TODAY'S TREATMENT:                                                                                                                              DATE: 12/30/22  Eval, inst in the exercises as described below.  Brief gait training with boot and clinic's front wheeled walker.  Provided with adjustable heel lift to use in boot for gradual R ankle dorsiflexion stretch, advised to  remove wedges as tolerated.  Advised to obtain FWW for home, patient states she has a 3 wheeled walker but unsteady.  Can borrow FWW from family member  PATIENT EDUCATION:  Education details: POC, goals Person educated: Patient Education method: Explanation, Demonstration, Tactile cues, Verbal cues, and Handouts Education comprehension: verbalized  understanding, returned demonstration, verbal cues required, tactile cues required, and needs further education  HOME EXERCISE PROGRAM: Access Code: [email or web address] URL: [email or web address] Date: 12/30/2022 Prepared by: Annalynn Centanni  Exercises - Long Sitting Ankle Plantar Flexion with Resistance  - 1 x daily - 7 x weekly - 3 sets - 10 reps red t band ,progress to green - Standing Knee Flexion Stretch on Step  - 1 x daily - 7 x weekly - 3 sets - 10 reps  ASSESSMENT:  CLINICAL IMPRESSION: Patient is a 52 y.o. female who was seen today for physical therapy evaluation and treatment for s/p R trimalleolar fracture.  She is 4 months post op.  Did not have surgical repair as similar fracture 10 yrs ago with osteomyelitis and poor outcome.Has just been cleared by surgeon to begin transitioning to [facility] and to tennis shoe. Currently utilizing knee scooter primarily.  Has loss of ROM r ankle in all directions but remarkably R ankle for dorsiflexion.  Also atrophied, weak r plantarflexors.  Was able to demonstrate fairly safe gait with boot and FWW during eval for short distances with slow, step to pattern. General strength loss B LE's.  Should benefit from skilled PT to address her deficits and improve her walking efficiency, stability, overall safety.  OBJECTIVE IMPAIRMENTS: decreased activity tolerance, decreased balance, decreased mobility, difficulty walking, decreased ROM, decreased strength, impaired perceived functional ability, impaired flexibility, postural dysfunction, and pain.   ACTIVITY LIMITATIONS: carrying, standing, squatting, stairs,  transfers, bathing, toileting, locomotion level, and caring for others  PARTICIPATION LIMITATIONS: meal prep, cleaning, laundry, driving, shopping, community activity, and yard work  PERSONAL FACTORS: Fitness, Past/current experiences, Time since onset of injury/illness/exacerbation, Transportation, and 1-2 comorbidities: smoker,emphysema, s/o osteomyelitis L ankle  are also affecting patient's functional outcome.   REHAB POTENTIAL: Fair due to medical co morbidities  CLINICAL DECISION MAKING: Evolving/moderate complexity  EVALUATION COMPLEXITY: Moderate   GOALS: Goals reviewed with patient? Yes  SHORT TERM GOALS: Target date: 2 weeks, 01/13/23 I HEP Baseline: Goal status: INITIAL  LONG TERM GOALS: Target date: 03/03/23  Gait speed greater than 1 m/sec with LAD over 800' for community distances Baseline: less than 0.5 m/sec Goal status: INITIAL  2.  LEFS Baseline: 14/80 Goal status: INITIAL  3.  Improve R ankle dorsiflexion ROM from -15 degrees to neutral for improved walking mechanics and transfer efficiency Baseline:  Goal status: INITIAL  4.  Strength B hips, knees 5/5 for improved endurance, activity tolerance, balance Baseline:4/5  Goal status: INITIAL  5.  Strength R ankle 3+5 all planes  Baseline: 3-/5 Goal status: INITIAL   PLAN: + PT FREQUENCY: 1-2x/week  PT DURATION: 8 weeks  PLANNED INTERVENTIONS: Therapeutic exercises, Therapeutic activity, Neuromuscular re-education, Balance training, Gait training, Patient/Family education, Self Care, and Joint mobilization  PLAN FOR NEXT SESSION: progress with the cardiovascular equipment in the gym, progress with therex to engage, strengthen R ankle, progress with gait training    Ko Bardon L Jasmine Maceachern, PT, DPT, OCS 12/30/2022, 12:34 PM

## 2023-01-01 ENCOUNTER — Other Ambulatory Visit: Payer: Self-pay | Admitting: Orthopedic Surgery

## 2023-01-01 MED ORDER — LORAZEPAM 1 MG PO TABS: 1.0000 mg | ORAL_TABLET | Freq: Three times a day (TID) | ORAL | 0 refills | Status: DC | PRN

## 2023-01-06 ENCOUNTER — Ambulatory Visit: Payer: 59

## 2023-01-08 ENCOUNTER — Ambulatory Visit: Payer: 59

## 2023-01-08 DIAGNOSIS — M25671 Stiffness of right ankle, not elsewhere classified: Secondary | ICD-10-CM

## 2023-01-08 DIAGNOSIS — R262 Difficulty in walking, not elsewhere classified: Secondary | ICD-10-CM

## 2023-01-08 DIAGNOSIS — S82891A Other fracture of right lower leg, initial encounter for closed fracture: Secondary | ICD-10-CM

## 2023-01-08 NOTE — Therapy (Signed)
OUTPATIENT PHYSICAL THERAPY LOWER EXTREMITY EVALUATION   Patient Name: Brianna Bowman MRN: [id number] DOB:1970/09/23, 52 y.o., female Today's Date: 01/08/2023  END OF SESSION:  PT End of Session - 01/08/23 0854     Visit Number 2              Past Medical History:  Diagnosis Date   Anemia    with pregnancy   Anxiety    due to condition   Depression    Headache(784.0)    history of   Hyperlipidemia    PONV (postoperative nausea and vomiting)    with epidural anad 04/28/13 BP low and took longer to wake after surgery 04/28/2012   Past Surgical History:  Procedure Laterality Date   FRACTURE SURGERY     L femur   HARDWARE REMOVAL Left 11/01/2012   Procedure: HARDWARE REMOVAL;  Surgeon: Eldred Manges, MD;  Location: [facility] OR;  Service: Orthopedics;  Laterality: Left;  Left Ankle Removal Fibula Plate and 10 screws, Irrigation and Debridement   MANDIBLE FRACTURE SURGERY     ORIF ANKLE FRACTURE  04/28/2012   Procedure: OPEN REDUCTION INTERNAL FIXATION (ORIF) ANKLE FRACTURE;  Surgeon: Eldred Manges, MD;  Location: [facility] OR;  Service: Orthopedics;  Laterality: Left;  Open reduction internal fixation left ankle bimalleolar fracture   Patient Active Problem List   Diagnosis Date Noted   Motion sickness 12/15/2022   White matter abnormality on MRI of brain 09/22/2022   Chronic migraine w/o aura, not intractable, w/o stat migr 09/22/2022   Opioid use 09/10/2022   Acute stress reaction 09/10/2022   Pink-colored urine 09/10/2022   Closed displaced trimalleolar fracture of lower leg, right, initial encounter 08/23/2022   Allergic reaction 08/22/2022   Screening for diabetes mellitus 08/12/2022   Hyperlipidemia 08/12/2022   Elevated blood pressure reading 08/12/2022   Need for hepatitis C screening test 08/12/2022   Encounter for screening mammogram for malignant neoplasm of breast 08/12/2022   Colon cancer screening 08/12/2022   Blurry vision 08/12/2022   Ankle pain, chronic 08/12/2022    Need for diphtheria-tetanus-pertussis (Tdap) vaccine 08/12/2022   Chest pain 02/09/2018   Anxiety state 02/09/2018   History of migraine headaches 02/09/2018   Emphysema of lung (HCC) 02/09/2018   Neuralgia 06/12/2017   Tobacco abuse 11/29/2012   Bone infection of left ankle (HCC) 11/01/2012   Closed fracture of left ankle 04/28/2012    Class: Acute    PCP: Modena Slater, MD  REFERRING PROVIDER: Willia Craze, MD  REFERRING DIAG: R trimalleolar ankle fracture  THERAPY DIAG:  Difficulty in walking, not elsewhere classified  Closed fracture of right ankle, initial encounter  Stiffness of right ankle, not elsewhere classified  Rationale for Evaluation and Treatment: Rehabilitation  ONSET DATE: 08/23/2022  SUBJECTIVE:   SUBJECTIVE STATEMENT: I haven't been able to get to where I can walk much with the boot, it pushed in the front of my leg too much  PERTINENT HISTORY: Injured R ankle on slide, did not undergo surgical fixation, now 4 months post op PAIN:  Are you having pain? Yes: NPRS scale: 2/10 Pain location: ant R ankle, R achilles tendon Pain description: pain with walking Aggravating factors: weight bearing Relieving factors: sitting  PRECAUTIONS: Fall  WEIGHT BEARING RESTRICTIONS: Yes WBAT advance from boot to shoe gradually  FALLS:  Has patient fallen in last 6 months? Yes. Number of falls 2  LIVING ENVIRONMENT: Lives with: lives with their family Lives in: House/apartment Stairs: Yes: External: 1 .5 steps;  on right going up Has following equipment at home:  knee scooter, 3 wheeled walker  OCCUPATION: disabled  PLOF: Independent with basic ADLs  PATIENT GOALS: get R ankle to heal and be able to walk without device  NEXT MD VISIT: 5 weeks, Aug 2024  OBJECTIVE:   DIAGNOSTIC FINDINGS: XRs of the right ankle taken 12/26/2022 were independently reviewed and interpreted, showing a bimalleolar ankle fracture. No callus formation seen.  Talus is under the  tibia but translated slightly laterally.  Displaced and laterally translated lateral malleolus.  There is a displaced medial malleolus fracture. Alignment appears similar to prior films. No new fractures seen.     PATIENT SURVEYS:  LEFS 14/80  COGNITION: Overall cognitive status: Within functional limits for tasks assessed     SENSATION: WFL  EDEMA:  Mild R lateral malleolus  POSTURE: weight shift left and hyperextends L knee, ER L hip and ankle  PALPATION: Non tender with palpation R ankle or achilles  LOWER EXTREMITY ROM:  Passive ROM Right eval Left eval  Hip flexion    Hip extension    Hip abduction    Hip adduction    Hip internal rotation    Hip external rotation    Knee flexion    Knee extension    Ankle dorsiflexion -15   Ankle plantarflexion 22   Ankle inversion 5   Ankle eversion 0    (Blank rows = not tested)  LOWER EXTREMITY MMT: B hips, knee musculature grossly 4/5 L ankle NT R ankle plantarflexion 3-/5 DF 3-/5 INV 3-/5   FUNCTIONAL TESTS:  NA  GAIT: Distance walked: with FWW x 100' in clnic Assistive device utilized: Environmental consultant - 2 wheeled Level of assistance: SBA Comments: cues for step to pattern and sequencing leading with R LE  Patient also using knee scooter upon arrival and to maneuver throughout clnic   TODAY'S TREATMENT:                                                                                                                              DATE:01/08/23 Dorsiflexion -15 initially , improved to -5 after stretching and therex Manual: brief stretching R ankle , mid foot, into dorsiflexion and metatarsal flexion   Therex: reviewed ex from initial session:    Seated ankle pumps with green t band Standing R ankle dorsiflexion stretch, runner's stretch in standing, with theraband across R ant ankle to stabilize subtalar jt and isolate R ankle plantarflexors. Added sit to stand from elevated table with hip adductor ball squeeze, to  further isolate quads musculature Seated long arc quads with 4#  10 x , therapist stopped due to crepitus R patella Seated ball rolls with plantar foot on ball surface multiple directions, encouraged to engage intrinsics R foot /ankle  Recumbent bike level 6 x  10 min Instructed and practiced stair climbing, up and down one step with b UE support. In the boot.   12/30/22  Eval,  inst in the exercises as described below.  Brief gait training with boot and clinic's front wheeled walker.  Provided with adjustable heel lift to use in boot for gradual R ankle dorsiflexion stretch, advised to remove wedges as tolerated.  Advised to obtain FWW for home, patient states she has a 3 wheeled walker but unsteady.  Can borrow FWW from family member  PATIENT EDUCATION:  Education details: POC, goals Person educated: Patient Education method: Explanation, Demonstration, Tactile cues, Verbal cues, and Handouts Education comprehension: verbalized understanding, returned demonstration, verbal cues required, tactile cues required, and needs further education  HOME EXERCISE PROGRAM: Access Code: [email or web address] URL: [email or web address] Date: 01/08/2023 Prepared by: Linton Rump Joscelyne Renville  Exercises - Seated Hip Adduction Squeeze with Ball  - 1 x daily - 7 x weekly - 3 sets - 10 reps - Sit to Stand Without Arm Support  - 1 x daily - 7 x weekly - 3 sets - 10 reps Access Code: [email or web address] URL: [email or web address] Date: 12/30/2022 Prepared by: Chelse Matas  Exercises - Long Sitting Ankle Plantar Flexion with Resistance  - 1 x daily - 7 x weekly - 3 sets - 10 reps red t band ,progress to green - Standing Knee Flexion Stretch on Step  - 1 x daily - 7 x weekly - 3 sets - 10 reps  ASSESSMENT:  CLINICAL IMPRESSION: Patient is a 52 y.o. female who was seen today for physical therapy treatment for s/p R trimalleolar fracture.  She is 4 1/2 months post injury.  Did not have surgical repair as similar  fracture 10 yrs ago with osteomyelitis and poor outcome.Has been transitioning to [facility] and to tennis shoe. Improved dorsiflexion ROM noted today as compared to initial appt 2 weeks ago.  Today practiced gait with front wheeled walker with B shoes,which she tolerated well, advised her to progress with this activity in short bouts indoors. Also progressed with gross strengthening R quads.   Currently utilizing knee scooter primarily.  Should continue to benefit from skilled PT to address her deficits and improve her walking efficiency, stability, overall safety.  OBJECTIVE IMPAIRMENTS: decreased activity tolerance, decreased balance, decreased mobility, difficulty walking, decreased ROM, decreased strength, impaired perceived functional ability, impaired flexibility, postural dysfunction, and pain.   ACTIVITY LIMITATIONS: carrying, standing, squatting, stairs, transfers, bathing, toileting, locomotion level, and caring for others  PARTICIPATION LIMITATIONS: meal prep, cleaning, laundry, driving, shopping, community activity, and yard work  PERSONAL FACTORS: Fitness, Past/current experiences, Time since onset of injury/illness/exacerbation, Transportation, and 1-2 comorbidities: smoker,emphysema, s/o osteomyelitis L ankle  are also affecting patient's functional outcome.   REHAB POTENTIAL: Fair due to medical co morbidities  CLINICAL DECISION MAKING: Evolving/moderate complexity  EVALUATION COMPLEXITY: Moderate   GOALS: Goals reviewed with patient? Yes  SHORT TERM GOALS: Target date: 2 weeks, 01/13/23 I HEP Baseline: Goal status: INITIAL  LONG TERM GOALS: Target date: 03/03/23  Gait speed greater than 1 m/sec with LAD over 800' for community distances Baseline: less than 0.5 m/sec Goal status: INITIAL  2.  LEFS Baseline: 14/80 Goal status: INITIAL  3.  Improve R ankle dorsiflexion ROM from -15 degrees to neutral for improved walking mechanics and transfer efficiency Baseline:  Goal  status: INITIAL  4.  Strength B hips, knees 5/5 for improved endurance, activity tolerance, balance Baseline:4/5  Goal status: INITIAL  5.  Strength R ankle 3+5 all planes  Baseline: 3-/5 Goal status: INITIAL   PLAN: + PT FREQUENCY: 1-2x/week  PT DURATION: 8 weeks  PLANNED INTERVENTIONS: Therapeutic exercises, Therapeutic activity,  Neuromuscular re-education, Balance training, Gait training, Patient/Family education, Self Care, and Joint mobilization  PLAN FOR NEXT SESSION: progress with the cardiovascular equipment in the gym, progress with therex to engage, strengthen R ankle, progress with gait training    Lind Ausley L Carly Applegate, PT, DPT, OCS 01/08/2023, 10:10 AM

## 2023-01-13 ENCOUNTER — Ambulatory Visit: Payer: 59

## 2023-01-13 ENCOUNTER — Other Ambulatory Visit: Payer: Self-pay

## 2023-01-13 DIAGNOSIS — S82891A Other fracture of right lower leg, initial encounter for closed fracture: Secondary | ICD-10-CM

## 2023-01-13 DIAGNOSIS — R262 Difficulty in walking, not elsewhere classified: Secondary | ICD-10-CM | POA: Diagnosis not present

## 2023-01-13 DIAGNOSIS — M25671 Stiffness of right ankle, not elsewhere classified: Secondary | ICD-10-CM

## 2023-01-13 NOTE — Therapy (Signed)
OUTPATIENT PHYSICAL THERAPY LOWER EXTREMITY TREATMENT   Patient Name: Brianna Bowman MRN: [id number] DOB:[birth year], 52 y.o., female Today's Date: 01/13/2023  END OF SESSION:  PT End of Session - 01/13/23 1614     Visit Number 3    Date for PT Re-Evaluation 02/24/23    Progress Note Due on Visit 10    PT Start Time 1534    PT Stop Time 1615    PT Time Calculation (min) 41 min    Activity Tolerance Patient tolerated treatment well    Behavior During Therapy [facility] for tasks assessed/performed               Past Medical History:  Diagnosis Date   Anemia    with pregnancy   Anxiety    due to condition   Depression    Headache(784.0)    history of   Hyperlipidemia    PONV (postoperative nausea and vomiting)    with epidural anad 04/28/13 BP low and took longer to wake after surgery 04/28/2012   Past Surgical History:  Procedure Laterality Date   FRACTURE SURGERY     L femur   HARDWARE REMOVAL Left 11/01/2012   Procedure: HARDWARE REMOVAL;  Surgeon: Eldred Manges, MD;  Location: [facility] OR;  Service: Orthopedics;  Laterality: Left;  Left Ankle Removal Fibula Plate and 10 screws, Irrigation and Debridement   MANDIBLE FRACTURE SURGERY     ORIF ANKLE FRACTURE  04/28/2012   Procedure: OPEN REDUCTION INTERNAL FIXATION (ORIF) ANKLE FRACTURE;  Surgeon: Eldred Manges, MD;  Location: [facility] OR;  Service: Orthopedics;  Laterality: Left;  Open reduction internal fixation left ankle bimalleolar fracture   Patient Active Problem List   Diagnosis Date Noted   Motion sickness 12/15/2022   White matter abnormality on MRI of brain 09/22/2022   Chronic migraine w/o aura, not intractable, w/o stat migr 09/22/2022   Opioid use 09/10/2022   Acute stress reaction 09/10/2022   Pink-colored urine 09/10/2022   Closed displaced trimalleolar fracture of lower leg, right, initial encounter 08/23/2022   Allergic reaction 08/22/2022   Screening for diabetes mellitus 08/12/2022   Hyperlipidemia 08/12/2022    Elevated blood pressure reading 08/12/2022   Need for hepatitis C screening test 08/12/2022   Encounter for screening mammogram for malignant neoplasm of breast 08/12/2022   Colon cancer screening 08/12/2022   Blurry vision 08/12/2022   Ankle pain, chronic 08/12/2022   Need for diphtheria-tetanus-pertussis (Tdap) vaccine 08/12/2022   Chest pain 02/09/2018   Anxiety state 02/09/2018   History of migraine headaches 02/09/2018   Emphysema of lung (HCC) 02/09/2018   Neuralgia 06/12/2017   Tobacco abuse 11/29/2012   Bone infection of left ankle (HCC) 11/01/2012   Closed fracture of left ankle 04/28/2012    Class: Acute    PCP: Modena Slater, MD  REFERRING PROVIDER: Willia Craze, MD  REFERRING DIAG: R trimalleolar ankle fracture  THERAPY DIAG:  Difficulty in walking, not elsewhere classified  Closed fracture of right ankle, initial encounter  Stiffness of right ankle, not elsewhere classified  Rationale for Evaluation and Treatment: Rehabilitation  ONSET DATE: 08/23/2022  SUBJECTIVE:   SUBJECTIVE STATEMENT: I have been walking in my crocs a lot.  Using the walker.  Also use the scooter when I have to move fast like to the bathroom. PERTINENT HISTORY: Injured R ankle on slide, did not undergo surgical fixation, now 4 months post op PAIN:  Are you having pain? Yes: NPRS scale: 2/10 Pain location: ant R ankle, R  achilles tendon Pain description: pain with walking Aggravating factors: weight bearing Relieving factors: sitting  PRECAUTIONS: Fall  WEIGHT BEARING RESTRICTIONS: Yes WBAT advance from boot to shoe gradually  FALLS:  Has patient fallen in last 6 months? Yes. Number of falls 2  LIVING ENVIRONMENT: Lives with: lives with their family Lives in: House/apartment Stairs: Yes: External: 1 .5 steps; on right going up Has following equipment at home:  knee scooter, 3 wheeled walker  OCCUPATION: disabled  PLOF: Independent with basic ADLs  PATIENT GOALS: get R ankle  to heal and be able to walk without device  NEXT MD VISIT: 5 weeks, Aug 2024  OBJECTIVE:   DIAGNOSTIC FINDINGS: XRs of the right ankle taken 12/26/2022 were independently reviewed and interpreted, showing a bimalleolar ankle fracture. No callus formation seen.  Talus is under the tibia but translated slightly laterally.  Displaced and laterally translated lateral malleolus.  There is a displaced medial malleolus fracture. Alignment appears similar to prior films. No new fractures seen.     PATIENT SURVEYS:  LEFS 14/80  COGNITION: Overall cognitive status: Within functional limits for tasks assessed     SENSATION: WFL  EDEMA:  Mild R lateral malleolus  POSTURE: weight shift left and hyperextends L knee, ER L hip and ankle  PALPATION: Non tender with palpation R ankle or achilles  LOWER EXTREMITY ROM:  Passive ROM Right eval Left eval  Hip flexion    Hip extension    Hip abduction    Hip adduction    Hip internal rotation    Hip external rotation    Knee flexion    Knee extension    Ankle dorsiflexion -15   Ankle plantarflexion 22   Ankle inversion 5   Ankle eversion 0    (Blank rows = not tested)  LOWER EXTREMITY MMT: B hips, knee musculature grossly 4/5 L ankle NT R ankle plantarflexion 3-/5 DF 3-/5 INV 3-/5   FUNCTIONAL TESTS:  NA  GAIT: Distance walked: with FWW x 100' in clnic Assistive device utilized: Environmental consultant - 2 wheeled Level of assistance: SBA Comments: cues for step to pattern and sequencing leading with R LE  Patient also using knee scooter upon arrival and to maneuver throughout clnic   TODAY'S TREATMENT:                                                                                                                              DATE: 01/13/23: Measured dorsiflexion -8 today initially Seated for BAPS board R ankle cw, ccw  Seated picking up marbles R foot Toe yoga R Wash cloth scrunches R foot on floor Standing for side stepping along  counter in crocs Standing weight shifting side to side, cues to maintain neutral hip position as she tends to flex R hip to accommodate for R ankle plantarflexion position.  Standing with B forefeet on 1/2" book for heel raises, holding counter to isolate B plantarflexor strength Seated resisted long arc  quads with green band Recumbent bike. Level 5 for 5 min, then level 7 for 2 min  01/08/23 Dorsiflexion -15 initially , improved to -5 after stretching and therex Manual: brief stretching R ankle , mid foot, into dorsiflexion and metatarsal flexion   Therex: reviewed ex from initial session:    Seated ankle pumps with green t band Standing R ankle dorsiflexion stretch, runner's stretch in standing, with theraband across R ant ankle to stabilize subtalar jt and isolate R ankle plantarflexors. Added sit to stand from elevated table with hip adductor ball squeeze, to further isolate quads musculature Seated long arc quads with 4#  10 x , therapist stopped due to crepitus R patella Seated ball rolls with plantar foot on ball surface multiple directions, encouraged to engage intrinsics R foot /ankle  Recumbent bike level 6 x  10 min Instructed and practiced stair climbing, up and down one step with b UE support. In the boot.   12/30/22  Eval, inst in the exercises as described below.  Brief gait training with boot and clinic's front wheeled walker.  Provided with adjustable heel lift to use in boot for gradual R ankle dorsiflexion stretch, advised to remove wedges as tolerated.  Advised to obtain FWW for home, patient states she has a 3 wheeled walker but unsteady.  Can borrow FWW from family member  PATIENT EDUCATION:  Education details: POC, goals Person educated: Patient Education method: Explanation, Demonstration, Tactile cues, Verbal cues, and Handouts Education comprehension: verbalized understanding, returned demonstration, verbal cues required, tactile cues required, and needs further  education  HOME EXERCISE PROGRAM: Access Code: [email or web address] URL: [email or web address] Date: 01/08/2023 Prepared by: Linton Rump Brianna Bowman  Exercises - Seated Hip Adduction Squeeze with Ball  - 1 x daily - 7 x weekly - 3 sets - 10 reps - Sit to Stand Without Arm Support  - 1 x daily - 7 x weekly - 3 sets - 10 reps Access Code: [email or web address] URL: [email or web address] Date: 12/30/2022 Prepared by: Brianna Bowman  Exercises - Long Sitting Ankle Plantar Flexion with Resistance  - 1 x daily - 7 x weekly - 3 sets - 10 reps red t band ,progress to green - Standing Knee Flexion Stretch on Step  - 1 x daily - 7 x weekly - 3 sets - 10 reps  ASSESSMENT:  CLINICAL IMPRESSION: Patient is a 52 y.o. female who was seen today for physical therapy treatment for s/p R trimalleolar fracture.  She is 4 1/2 months post injury.  Did not have surgical repair as similar fracture 10 yrs ago with osteomyelitis and poor outcome.Has been transitioning to [facility] and to tennis shoe. Improved dorsiflexion ROM noted again today . Also better walking mechanics with FWW.  progressed with gross strengthening R quads and additional standing weight bearing activities. Should continue to benefit from skilled PT to address her deficits and improve her walking efficiency, stability, overall safety.  OBJECTIVE IMPAIRMENTS: decreased activity tolerance, decreased balance, decreased mobility, difficulty walking, decreased ROM, decreased strength, impaired perceived functional ability, impaired flexibility, postural dysfunction, and pain.   ACTIVITY LIMITATIONS: carrying, standing, squatting, stairs, transfers, bathing, toileting, locomotion level, and caring for others  PARTICIPATION LIMITATIONS: meal prep, cleaning, laundry, driving, shopping, community activity, and yard work  PERSONAL FACTORS: Fitness, Past/current experiences, Time since onset of injury/illness/exacerbation, Transportation, and 1-2 comorbidities:  smoker,emphysema, s/o osteomyelitis L ankle  are also affecting patient's functional outcome.   REHAB POTENTIAL: Fair due to medical co morbidities  CLINICAL DECISION MAKING: Evolving/moderate complexity  EVALUATION  COMPLEXITY: Moderate   GOALS: Goals reviewed with patient? Yes  SHORT TERM GOALS: Target date: 2 weeks, 01/13/23 I HEP Baseline: Goal status: INITIAL  LONG TERM GOALS: Target date: 03/03/23  Gait speed greater than 1 m/sec with LAD over 800' for community distances Baseline: less than 0.5 m/sec Goal status: INITIAL  2.  LEFS Baseline: 14/80 Goal status: INITIAL  3.  Improve R ankle dorsiflexion ROM from -15 degrees to neutral for improved walking mechanics and transfer efficiency Baseline:  Goal status: INITIAL  4.  Strength B hips, knees 5/5 for improved endurance, activity tolerance, balance Baseline:4/5  Goal status: INITIAL  5.  Strength R ankle 3+5 all planes  Baseline: 3-/5 Goal status: INITIAL   PLAN: + PT FREQUENCY: 1-2x/week  PT DURATION: 8 weeks  PLANNED INTERVENTIONS: Therapeutic exercises, Therapeutic activity, Neuromuscular re-education, Balance training, Gait training, Patient/Family education, Self Care, and Joint mobilization  PLAN FOR NEXT SESSION: progress with the cardiovascular equipment in the gym, progress with therex to engage, strengthen R ankle, progress with gait training    Brianna Bowman, PT, DPT, OCS 01/13/2023, 5:21 PM

## 2023-01-15 ENCOUNTER — Ambulatory Visit: Payer: 59

## 2023-01-15 DIAGNOSIS — R262 Difficulty in walking, not elsewhere classified: Secondary | ICD-10-CM | POA: Diagnosis not present

## 2023-01-15 DIAGNOSIS — S82891A Other fracture of right lower leg, initial encounter for closed fracture: Secondary | ICD-10-CM

## 2023-01-15 DIAGNOSIS — M25671 Stiffness of right ankle, not elsewhere classified: Secondary | ICD-10-CM

## 2023-01-15 NOTE — Therapy (Signed)
OUTPATIENT PHYSICAL THERAPY LOWER EXTREMITY TREATMENT   Patient Name: Brianna Bowman MRN: [id number] DOB:[birth year], 52 y.o., female Today's Date: 01/15/2023  END OF SESSION:  PT End of Session - 01/15/23 1552     Visit Number 4    Date for PT Re-Evaluation 02/24/23    Progress Note Due on Visit 10    PT Start Time 1526    PT Stop Time 1610    PT Time Calculation (min) 44 min    Activity Tolerance Patient tolerated treatment well    Behavior During Therapy [facility] for tasks assessed/performed                Past Medical History:  Diagnosis Date   Anemia    with pregnancy   Anxiety    due to condition   Depression    Headache(784.0)    history of   Hyperlipidemia    PONV (postoperative nausea and vomiting)    with epidural anad 04/28/13 BP low and took longer to wake after surgery 04/28/2012   Past Surgical History:  Procedure Laterality Date   FRACTURE SURGERY     L femur   HARDWARE REMOVAL Left 11/01/2012   Procedure: HARDWARE REMOVAL;  Surgeon: Eldred Manges, MD;  Location: [facility] OR;  Service: Orthopedics;  Laterality: Left;  Left Ankle Removal Fibula Plate and 10 screws, Irrigation and Debridement   MANDIBLE FRACTURE SURGERY     ORIF ANKLE FRACTURE  04/28/2012   Procedure: OPEN REDUCTION INTERNAL FIXATION (ORIF) ANKLE FRACTURE;  Surgeon: Eldred Manges, MD;  Location: [facility] OR;  Service: Orthopedics;  Laterality: Left;  Open reduction internal fixation left ankle bimalleolar fracture   Patient Active Problem List   Diagnosis Date Noted   Motion sickness 12/15/2022   White matter abnormality on MRI of brain 09/22/2022   Chronic migraine w/o aura, not intractable, w/o stat migr 09/22/2022   Opioid use 09/10/2022   Acute stress reaction 09/10/2022   Pink-colored urine 09/10/2022   Closed displaced trimalleolar fracture of lower leg, right, initial encounter 08/23/2022   Allergic reaction 08/22/2022   Screening for diabetes mellitus 08/12/2022   Hyperlipidemia 08/12/2022    Elevated blood pressure reading 08/12/2022   Need for hepatitis C screening test 08/12/2022   Encounter for screening mammogram for malignant neoplasm of breast 08/12/2022   Colon cancer screening 08/12/2022   Blurry vision 08/12/2022   Ankle pain, chronic 08/12/2022   Need for diphtheria-tetanus-pertussis (Tdap) vaccine 08/12/2022   Chest pain 02/09/2018   Anxiety state 02/09/2018   History of migraine headaches 02/09/2018   Emphysema of lung (HCC) 02/09/2018   Neuralgia 06/12/2017   Tobacco abuse 11/29/2012   Bone infection of left ankle (HCC) 11/01/2012   Closed fracture of left ankle 04/28/2012    Class: Acute    PCP: Modena Slater, MD  REFERRING PROVIDER: Willia Craze, MD  REFERRING DIAG: R trimalleolar ankle fracture  THERAPY DIAG:  Difficulty in walking, not elsewhere classified  Closed fracture of right ankle, initial encounter  Stiffness of right ankle, not elsewhere classified  Rationale for Evaluation and Treatment: Rehabilitation  ONSET DATE: 08/23/2022  SUBJECTIVE:   SUBJECTIVE STATEMENT: Pt reports soreness and stiffness today.  PERTINENT HISTORY: Injured R ankle on slide, did not undergo surgical fixation, now 4 months post op PAIN:  Are you having pain? Yes: NPRS scale: 3/10 Pain location: ant R ankle, R achilles tendon Pain description: pain with walking Aggravating factors: weight bearing Relieving factors: sitting  PRECAUTIONS: Fall  WEIGHT BEARING  RESTRICTIONS: Yes WBAT advance from boot to shoe gradually  FALLS:  Has patient fallen in last 6 months? Yes. Number of falls 2  LIVING ENVIRONMENT: Lives with: lives with their family Lives in: House/apartment Stairs: Yes: External: 1 .5 steps; on right going up Has following equipment at home:  knee scooter, 3 wheeled walker  OCCUPATION: disabled  PLOF: Independent with basic ADLs  PATIENT GOALS: get R ankle to heal and be able to walk without device  NEXT MD VISIT: 5 weeks, Aug  2024  OBJECTIVE:   DIAGNOSTIC FINDINGS: XRs of the right ankle taken 12/26/2022 were independently reviewed and interpreted, showing a bimalleolar ankle fracture. No callus formation seen.  Talus is under the tibia but translated slightly laterally.  Displaced and laterally translated lateral malleolus.  There is a displaced medial malleolus fracture. Alignment appears similar to prior films. No new fractures seen.     PATIENT SURVEYS:  LEFS 14/80  COGNITION: Overall cognitive status: Within functional limits for tasks assessed     SENSATION: WFL  EDEMA:  Mild R lateral malleolus  POSTURE: weight shift left and hyperextends L knee, ER L hip and ankle  PALPATION: Non tender with palpation R ankle or achilles  LOWER EXTREMITY ROM:  Passive ROM Right eval Left eval  Hip flexion    Hip extension    Hip abduction    Hip adduction    Hip internal rotation    Hip external rotation    Knee flexion    Knee extension    Ankle dorsiflexion -15   Ankle plantarflexion 22   Ankle inversion 5   Ankle eversion 0    (Blank rows = not tested)  LOWER EXTREMITY MMT: B hips, knee musculature grossly 4/5 L ankle NT R ankle plantarflexion 3-/5 DF 3-/5 INV 3-/5   FUNCTIONAL TESTS:  NA  GAIT: Distance walked: with FWW x 100' in clnic Assistive device utilized: Environmental consultant - 2 wheeled Level of assistance: SBA Comments: cues for step to pattern and sequencing leading with R LE  Patient also using knee scooter upon arrival and to maneuver throughout clnic   TODAY'S TREATMENT:                                                                                                                              DATE: 01/15/23 Seated toy yoga R x 10  Toe curls 2x10 BAPS fwd, circles x 10 bil PF with blue TB x 10  Knee extension with blue TB x 10 R Recumbent Bike L7x46min Fwd WS onto 4' step while sitting x 10  Heel slide RLE x 10   01/13/23: Measured dorsiflexion -8 today initially Seated for  BAPS board R ankle cw, ccw  Seated picking up marbles R foot Toe yoga R Wash cloth scrunches R foot on floor Standing for side stepping along counter in crocs Standing weight shifting side to side, cues to maintain neutral hip position as she tends to  flex R hip to accommodate for R ankle plantarflexion position.  Standing with B forefeet on 1/2" book for heel raises, holding counter to isolate B plantarflexor strength Seated resisted long arc quads with green band Recumbent bike. Level 5 for 5 min, then level 7 for 2 min  01/08/23 Dorsiflexion -15 initially , improved to -5 after stretching and therex Manual: brief stretching R ankle , mid foot, into dorsiflexion and metatarsal flexion   Therex: reviewed ex from initial session:    Seated ankle pumps with green t band Standing R ankle dorsiflexion stretch, runner's stretch in standing, with theraband across R ant ankle to stabilize subtalar jt and isolate R ankle plantarflexors. Added sit to stand from elevated table with hip adductor ball squeeze, to further isolate quads musculature Seated long arc quads with 4#  10 x , therapist stopped due to crepitus R patella Seated ball rolls with plantar foot on ball surface multiple directions, encouraged to engage intrinsics R foot /ankle  Recumbent bike level 6 x  10 min Instructed and practiced stair climbing, up and down one step with b UE support. In the boot.   12/30/22  Eval, inst in the exercises as described below.  Brief gait training with boot and clinic's front wheeled walker.  Provided with adjustable heel lift to use in boot for gradual R ankle dorsiflexion stretch, advised to remove wedges as tolerated.  Advised to obtain FWW for home, patient states she has a 3 wheeled walker but unsteady.  Can borrow FWW from family member  PATIENT EDUCATION:  Education details: POC, goals Person educated: Patient Education method: Explanation, Demonstration, Tactile cues, Verbal cues, and  Handouts Education comprehension: verbalized understanding, returned demonstration, verbal cues required, tactile cues required, and needs further education  HOME EXERCISE PROGRAM: Access Code: [email or web address] URL: [email or web address] Date: 01/08/2023 Prepared by: Linton Rump Speaks  Exercises - Seated Hip Adduction Squeeze with Ball  - 1 x daily - 7 x weekly - 3 sets - 10 reps - Sit to Stand Without Arm Support  - 1 x daily - 7 x weekly - 3 sets - 10 reps Access Code: [email or web address] URL: [email or web address] Date: 12/30/2022 Prepared by: Amy Speaks  Exercises - Long Sitting Ankle Plantar Flexion with Resistance  - 1 x daily - 7 x weekly - 3 sets - 10 reps red t band ,progress to green - Standing Knee Flexion Stretch on Step  - 1 x daily - 7 x weekly - 3 sets - 10 reps  ASSESSMENT:  CLINICAL IMPRESSION: Patient is a 52 y.o. female who was seen today for physical therapy treatment for s/p R trimalleolar fracture.  Pt showed a good tolerance for the treatment today. We toned down the exercises as she was more sore today. She was able to progress to blue TB with knee extension and PF. She shows a step to pattern with only TTWB, very painful putting full weight through heel. Should continue to benefit from skilled PT to address her deficits and improve her walking efficiency, stability, overall safety.  OBJECTIVE IMPAIRMENTS: decreased activity tolerance, decreased balance, decreased mobility, difficulty walking, decreased ROM, decreased strength, impaired perceived functional ability, impaired flexibility, postural dysfunction, and pain.   ACTIVITY LIMITATIONS: carrying, standing, squatting, stairs, transfers, bathing, toileting, locomotion level, and caring for others  PARTICIPATION LIMITATIONS: meal prep, cleaning, laundry, driving, shopping, community activity, and yard work  PERSONAL FACTORS: Fitness, Past/current experiences, Time since onset of  injury/illness/exacerbation, Transportation, and 1-2 comorbidities: smoker,emphysema, s/o osteomyelitis L ankle  are  also affecting patient's functional outcome.   REHAB POTENTIAL: Fair due to medical co morbidities  CLINICAL DECISION MAKING: Evolving/moderate complexity  EVALUATION COMPLEXITY: Moderate   GOALS: Goals reviewed with patient? Yes  SHORT TERM GOALS: Target date: 2 weeks, 01/13/23 I HEP Baseline: Goal status: INITIAL  LONG TERM GOALS: Target date: 03/03/23  Gait speed greater than 1 m/sec with LAD over 800' for community distances Baseline: less than 0.5 m/sec Goal status: INITIAL  2.  LEFS Baseline: 14/80 Goal status: INITIAL  3.  Improve R ankle dorsiflexion ROM from -15 degrees to neutral for improved walking mechanics and transfer efficiency Baseline:  Goal status: INITIAL  4.  Strength B hips, knees 5/5 for improved endurance, activity tolerance, balance Baseline:4/5  Goal status: INITIAL  5.  Strength R ankle 3+5 all planes  Baseline: 3-/5 Goal status: INITIAL   PLAN: + PT FREQUENCY: 1-2x/week  PT DURATION: 8 weeks  PLANNED INTERVENTIONS: Therapeutic exercises, Therapeutic activity, Neuromuscular re-education, Balance training, Gait training, Patient/Family education, Self Care, and Joint mobilization  PLAN FOR NEXT SESSION: progress with the cardiovascular equipment in the gym, progress with therex to engage, strengthen R ankle, progress with gait training    Darleene Cleaver, PTA  01/15/2023, 4:42 PM

## 2023-01-20 ENCOUNTER — Ambulatory Visit: Payer: 59

## 2023-01-22 ENCOUNTER — Ambulatory Visit: Payer: 59 | Attending: Orthopedic Surgery | Admitting: Physical Therapy

## 2023-01-22 ENCOUNTER — Encounter: Payer: Self-pay | Admitting: Physical Therapy

## 2023-01-22 DIAGNOSIS — S82891A Other fracture of right lower leg, initial encounter for closed fracture: Secondary | ICD-10-CM | POA: Insufficient documentation

## 2023-01-22 DIAGNOSIS — M25671 Stiffness of right ankle, not elsewhere classified: Secondary | ICD-10-CM | POA: Insufficient documentation

## 2023-01-22 DIAGNOSIS — R262 Difficulty in walking, not elsewhere classified: Secondary | ICD-10-CM | POA: Diagnosis present

## 2023-01-22 NOTE — Therapy (Signed)
OUTPATIENT PHYSICAL THERAPY LOWER EXTREMITY TREATMENT   Patient Name: Brianna Bowman MRN: [id number] DOB:04-23-1971, 52 y.o., female Today's Date: 01/22/2023  END OF SESSION:  PT End of Session - 01/22/23 1337     Visit Number 5    Date for PT Re-Evaluation 02/24/23    Progress Note Due on Visit 10    PT Start Time 1318    PT Stop Time 1358    PT Time Calculation (min) 40 min    Activity Tolerance Patient tolerated treatment well    Behavior During Therapy [facility] for tasks assessed/performed                 Past Medical History:  Diagnosis Date   Anemia    with pregnancy   Anxiety    due to condition   Depression    Headache(784.0)    history of   Hyperlipidemia    PONV (postoperative nausea and vomiting)    with epidural anad 04/28/13 BP low and took longer to wake after surgery 04/28/2012   Past Surgical History:  Procedure Laterality Date   FRACTURE SURGERY     L femur   HARDWARE REMOVAL Left 11/01/2012   Procedure: HARDWARE REMOVAL;  Surgeon: Eldred Manges, MD;  Location: [facility] OR;  Service: Orthopedics;  Laterality: Left;  Left Ankle Removal Fibula Plate and 10 screws, Irrigation and Debridement   MANDIBLE FRACTURE SURGERY     ORIF ANKLE FRACTURE  04/28/2012   Procedure: OPEN REDUCTION INTERNAL FIXATION (ORIF) ANKLE FRACTURE;  Surgeon: Eldred Manges, MD;  Location: [facility] OR;  Service: Orthopedics;  Laterality: Left;  Open reduction internal fixation left ankle bimalleolar fracture   Patient Active Problem List   Diagnosis Date Noted   Motion sickness 12/15/2022   White matter abnormality on MRI of brain 09/22/2022   Chronic migraine w/o aura, not intractable, w/o stat migr 09/22/2022   Opioid use 09/10/2022   Acute stress reaction 09/10/2022   Pink-colored urine 09/10/2022   Closed displaced trimalleolar fracture of lower leg, right, initial encounter 08/23/2022   Allergic reaction 08/22/2022   Screening for diabetes mellitus 08/12/2022   Hyperlipidemia 08/12/2022    Elevated blood pressure reading 08/12/2022   Need for hepatitis C screening test 08/12/2022   Encounter for screening mammogram for malignant neoplasm of breast 08/12/2022   Colon cancer screening 08/12/2022   Blurry vision 08/12/2022   Ankle pain, chronic 08/12/2022   Need for diphtheria-tetanus-pertussis (Tdap) vaccine 08/12/2022   Chest pain 02/09/2018   Anxiety state 02/09/2018   History of migraine headaches 02/09/2018   Emphysema of lung (HCC) 02/09/2018   Neuralgia 06/12/2017   Tobacco abuse 11/29/2012   Bone infection of left ankle (HCC) 11/01/2012   Closed fracture of left ankle 04/28/2012    Class: Acute    PCP: Modena Slater, MD  REFERRING PROVIDER: Willia Craze, MD  REFERRING DIAG: R trimalleolar ankle fracture  THERAPY DIAG:  Difficulty in walking, not elsewhere classified  Closed fracture of right ankle, initial encounter  Stiffness of right ankle, not elsewhere classified  Rationale for Evaluation and Treatment: Rehabilitation  ONSET DATE: 08/23/2022  SUBJECTIVE:   SUBJECTIVE STATEMENT:  I've been having a lot of falls recently, I'm not sure exactly why, my leg buckled and down I went. I fell here in the parking lot and the tires on my scooter got caught also fell in the shower recently. I don't see the doctor until September 7th. Not sure if I hit my newly hurt ankle.  PERTINENT HISTORY: Injured R ankle on slide, did not undergo surgical fixation, now 4 months post op PAIN:  Are you having pain? Yes: NPRS scale: 6 L, 3 R/10 Pain location: b ankles  Pain description: L ache, R throbs and constantly hurts  Aggravating factors: weight bearing Relieving factors: sitting  PRECAUTIONS: Fall  WEIGHT BEARING RESTRICTIONS: Yes WBAT advance from boot to shoe gradually  FALLS:  Has patient fallen in last 6 months? Yes. Number of falls 2  LIVING ENVIRONMENT: Lives with: lives with their family Lives in: House/apartment Stairs: Yes: External: 1 .5 steps;  on right going up Has following equipment at home:  knee scooter, 3 wheeled walker  OCCUPATION: disabled  PLOF: Independent with basic ADLs  PATIENT GOALS: get R ankle to heal and be able to walk without device  NEXT MD VISIT: 5 weeks, Aug 2024  OBJECTIVE:   DIAGNOSTIC FINDINGS: XRs of the right ankle taken 12/26/2022 were independently reviewed and interpreted, showing a bimalleolar ankle fracture. No callus formation seen.  Talus is under the tibia but translated slightly laterally.  Displaced and laterally translated lateral malleolus.  There is a displaced medial malleolus fracture. Alignment appears similar to prior films. No new fractures seen.     PATIENT SURVEYS:  LEFS 14/80  COGNITION: Overall cognitive status: Within functional limits for tasks assessed     SENSATION: WFL  EDEMA:  Mild R lateral malleolus  POSTURE: weight shift left and hyperextends L knee, ER L hip and ankle  PALPATION: Non tender with palpation R ankle or achilles  LOWER EXTREMITY ROM:  Passive ROM Right eval Right 01/22/23  Hip flexion    Hip extension    Hip abduction    Hip adduction    Hip internal rotation    Hip external rotation    Knee flexion    Knee extension    Ankle dorsiflexion -15 -16*  Ankle plantarflexion 22 40*  Ankle inversion 5 15*  Ankle eversion 0 12*   (Blank rows = not tested)  LOWER EXTREMITY MMT: B hips, knee musculature grossly 4/5 L ankle NT R ankle plantarflexion 3-/5 DF 3-/5 INV 3-/5  01/22/23-  R DF 4 in available ROM, inversion/eversion 4+/5 in available ROM, R quads 4+/5, R hams 4-/5, L quad 4-/5, L hams 3+/5   FUNCTIONAL TESTS:  NA  GAIT: Distance walked: with FWW x 100' in clnic Assistive device utilized: Environmental consultant - 2 wheeled Level of assistance: SBA Comments: cues for step to pattern and sequencing leading with R LE  Patient also using knee scooter upon arrival and to maneuver throughout clnic   TODAY'S TREATMENT:                                                                                                                               DATE:    01/22/23  Objective measures, appropriate education, brief goal review  TherEx  Seated rockerboard x20 with 3-5 seconds holds in  each position Seated rockerboard x20 inversion/eversion 3-5 second holds each position  Gastroc stretch 4x30 seconds  Standing wt shifts feet shoulder width apart x10  Heel-toe rocking x10 with L foot forward, x10 with R foot forward   Manual   Ankle dorsiflexion mobs grade III  Heel inversion/eversion grade III Forefoot mobility grade III     01/15/23 Seated toy yoga R x 10  Toe curls 2x10 BAPS fwd, circles x 10 bil PF with blue TB x 10  Knee extension with blue TB x 10 R Recumbent Bike L7x75min Fwd WS onto 4' step while sitting x 10  Heel slide RLE x 10   01/13/23: Measured dorsiflexion -8 today initially Seated for BAPS board R ankle cw, ccw  Seated picking up marbles R foot Toe yoga R Wash cloth scrunches R foot on floor Standing for side stepping along counter in crocs Standing weight shifting side to side, cues to maintain neutral hip position as she tends to flex R hip to accommodate for R ankle plantarflexion position.  Standing with B forefeet on 1/2" book for heel raises, holding counter to isolate B plantarflexor strength Seated resisted long arc quads with green band Recumbent bike. Level 5 for 5 min, then level 7 for 2 min  01/08/23 Dorsiflexion -15 initially , improved to -5 after stretching and therex Manual: brief stretching R ankle , mid foot, into dorsiflexion and metatarsal flexion   Therex: reviewed ex from initial session:    Seated ankle pumps with green t band Standing R ankle dorsiflexion stretch, runner's stretch in standing, with theraband across R ant ankle to stabilize subtalar jt and isolate R ankle plantarflexors. Added sit to stand from elevated table with hip adductor ball squeeze, to further isolate  quads musculature Seated long arc quads with 4#  10 x , therapist stopped due to crepitus R patella Seated ball rolls with plantar foot on ball surface multiple directions, encouraged to engage intrinsics R foot /ankle  Recumbent bike level 6 x  10 min Instructed and practiced stair climbing, up and down one step with b UE support. In the boot.   12/30/22  Eval, inst in the exercises as described below.  Brief gait training with boot and clinic's front wheeled walker.  Provided with adjustable heel lift to use in boot for gradual R ankle dorsiflexion stretch, advised to remove wedges as tolerated.  Advised to obtain FWW for home, patient states she has a 3 wheeled walker but unsteady.  Can borrow FWW from family member  PATIENT EDUCATION:  Education details: POC, goals Person educated: Patient Education method: Explanation, Demonstration, Tactile cues, Verbal cues, and Handouts Education comprehension: verbalized understanding, returned demonstration, verbal cues required, tactile cues required, and needs further education  HOME EXERCISE PROGRAM: Access Code: [email or web address] URL: [email or web address] Date: 01/08/2023 Prepared by: Linton Rump Speaks  Exercises - Seated Hip Adduction Squeeze with Ball  - 1 x daily - 7 x weekly - 3 sets - 10 reps - Sit to Stand Without Arm Support  - 1 x daily - 7 x weekly - 3 sets - 10 reps Access Code: [email or web address] URL: [email or web address] Date: 12/30/2022 Prepared by: Amy Speaks  Exercises - Long Sitting Ankle Plantar Flexion with Resistance  - 1 x daily - 7 x weekly - 3 sets - 10 reps red t band ,progress to green - Standing Knee Flexion Stretch on Step  - 1 x daily - 7 x weekly - 3 sets - 10 reps  ASSESSMENT:  CLINICAL IMPRESSION:  Wakisha arrives today doing OK, unfortunately she has been having a lot of falls at home, sounds like it may be due to some weakness also impaired mobility from her ankle. She's not sure if she hit her R  ankle in these falls, no signs of excessive bruising or edema that would indicate acute injury. She is making progress towards her goals in general, will really benefit from continuation of skilled PT services especially given recent frequency of falls. Will continue to progress as able.   OBJECTIVE IMPAIRMENTS: decreased activity tolerance, decreased balance, decreased mobility, difficulty walking, decreased ROM, decreased strength, impaired perceived functional ability, impaired flexibility, postural dysfunction, and pain.   ACTIVITY LIMITATIONS: carrying, standing, squatting, stairs, transfers, bathing, toileting, locomotion level, and caring for others  PARTICIPATION LIMITATIONS: meal prep, cleaning, laundry, driving, shopping, community activity, and yard work  PERSONAL FACTORS: Fitness, Past/current experiences, Time since onset of injury/illness/exacerbation, Transportation, and 1-2 comorbidities: smoker,emphysema, s/o osteomyelitis L ankle  are also affecting patient's functional outcome.   REHAB POTENTIAL: Fair due to medical co morbidities  CLINICAL DECISION MAKING: Evolving/moderate complexity  EVALUATION COMPLEXITY: Moderate   GOALS: Goals reviewed with patient? Yes  SHORT TERM GOALS: Target date: 2 weeks, 01/13/23 I HEP Baseline: Goal status: MET 01/22/23  LONG TERM GOALS: Target date: 03/03/23  Gait speed greater than 1 m/sec with LAD over 800' for community distances Baseline: less than 0.5 m/sec Goal status: INITIAL  2.  LEFS Baseline: 14/80 Goal status: INITIAL  3.  Improve R ankle dorsiflexion ROM from -15 degrees to neutral for improved walking mechanics and transfer efficiency Baseline:  Goal status: ONGOING 01/22/23  4.  Strength B hips, knees 5/5 for improved endurance, activity tolerance, balance Baseline:4/5  Goal status: ONGOING 01/22/23  5.  Strength R ankle 3+5 all planes  Baseline: 3-/5 Goal status: INITIAL   PLAN: + PT FREQUENCY: 1-2x/week  PT  DURATION: 8 weeks  PLANNED INTERVENTIONS: Therapeutic exercises, Therapeutic activity, Neuromuscular re-education, Balance training, Gait training, Patient/Family education, Self Care, and Joint mobilization  PLAN FOR NEXT SESSION: progress with the cardiovascular equipment in the gym, progress with therex to engage, strengthen R ankle, progress with gait training. Consider Jass brace for ankle?   Nedra Hai, PT, DPT 01/22/23 1:59 PM

## 2023-01-27 ENCOUNTER — Ambulatory Visit: Payer: 59

## 2023-01-27 ENCOUNTER — Other Ambulatory Visit: Payer: Self-pay

## 2023-01-27 DIAGNOSIS — S82891A Other fracture of right lower leg, initial encounter for closed fracture: Secondary | ICD-10-CM

## 2023-01-27 DIAGNOSIS — R262 Difficulty in walking, not elsewhere classified: Secondary | ICD-10-CM | POA: Diagnosis not present

## 2023-01-27 DIAGNOSIS — M25671 Stiffness of right ankle, not elsewhere classified: Secondary | ICD-10-CM

## 2023-01-27 NOTE — Therapy (Signed)
OUTPATIENT PHYSICAL THERAPY LOWER EXTREMITY TREATMENT   Patient Name: Brianna Bowman MRN: [id number] DOB:02-12-1971, 52 y.o., female Today's Date: 01/27/2023  END OF SESSION:  PT End of Session - 01/27/23 1101     Visit Number 6    Date for PT Re-Evaluation 02/24/23    Progress Note Due on Visit 10    PT Start Time 1100    PT Stop Time 1145    PT Time Calculation (min) 45 min    Activity Tolerance Patient tolerated treatment well                  Past Medical History:  Diagnosis Date   Anemia    with pregnancy   Anxiety    due to condition   Depression    Headache(784.0)    history of   Hyperlipidemia    PONV (postoperative nausea and vomiting)    with epidural anad 04/28/13 BP low and took longer to wake after surgery 04/28/2012   Past Surgical History:  Procedure Laterality Date   FRACTURE SURGERY     L femur   HARDWARE REMOVAL Left 11/01/2012   Procedure: HARDWARE REMOVAL;  Surgeon: Eldred Manges, MD;  Location: [facility] OR;  Service: Orthopedics;  Laterality: Left;  Left Ankle Removal Fibula Plate and 10 screws, Irrigation and Debridement   MANDIBLE FRACTURE SURGERY     ORIF ANKLE FRACTURE  04/28/2012   Procedure: OPEN REDUCTION INTERNAL FIXATION (ORIF) ANKLE FRACTURE;  Surgeon: Eldred Manges, MD;  Location: [facility] OR;  Service: Orthopedics;  Laterality: Left;  Open reduction internal fixation left ankle bimalleolar fracture   Patient Active Problem List   Diagnosis Date Noted   Motion sickness 12/15/2022   White matter abnormality on MRI of brain 09/22/2022   Chronic migraine w/o aura, not intractable, w/o stat migr 09/22/2022   Opioid use 09/10/2022   Acute stress reaction 09/10/2022   Pink-colored urine 09/10/2022   Closed displaced trimalleolar fracture of lower leg, right, initial encounter 08/23/2022   Allergic reaction 08/22/2022   Screening for diabetes mellitus 08/12/2022   Hyperlipidemia 08/12/2022   Elevated blood pressure reading 08/12/2022   Need for  hepatitis C screening test 08/12/2022   Encounter for screening mammogram for malignant neoplasm of breast 08/12/2022   Colon cancer screening 08/12/2022   Blurry vision 08/12/2022   Ankle pain, chronic 08/12/2022   Need for diphtheria-tetanus-pertussis (Tdap) vaccine 08/12/2022   Chest pain 02/09/2018   Anxiety state 02/09/2018   History of migraine headaches 02/09/2018   Emphysema of lung (HCC) 02/09/2018   Neuralgia 06/12/2017   Tobacco abuse 11/29/2012   Bone infection of left ankle (HCC) 11/01/2012   Closed fracture of left ankle 04/28/2012    Class: Acute    PCP: Modena Slater, MD  REFERRING PROVIDER: Willia Craze, MD  REFERRING DIAG: R trimalleolar ankle fracture  THERAPY DIAG:  Difficulty in walking, not elsewhere classified  Closed fracture of right ankle, initial encounter  Stiffness of right ankle, not elsewhere classified  Rationale for Evaluation and Treatment: Rehabilitation  ONSET DATE: 08/23/2022  SUBJECTIVE:   SUBJECTIVE STATEMENT:  PERTINENT HISTORY: Injured R ankle on slide, did not undergo surgical fixation, now 4 months post op PAIN:  Are you having pain? Yes: NPRS scale: 6 L, 3 R/10 Pain location: b ankles  Pain description: L ache, R throbs and constantly hurts  Aggravating factors: weight bearing Relieving factors: sitting  PRECAUTIONS: Fall  WEIGHT BEARING RESTRICTIONS: Yes WBAT advance from boot to shoe gradually  FALLS:  Has patient fallen in last 6 months? Yes. Number of falls 2  LIVING ENVIRONMENT: Lives with: lives with their family Lives in: House/apartment Stairs: Yes: External: 1 .5 steps; on right going up Has following equipment at home:  knee scooter, 3 wheeled walker  OCCUPATION: disabled  PLOF: Independent with basic ADLs  PATIENT GOALS: get R ankle to heal and be able to walk without device  NEXT MD VISIT: 5 weeks, Aug 2024  OBJECTIVE:   DIAGNOSTIC FINDINGS: XRs of the right ankle taken 12/26/2022 were  independently reviewed and interpreted, showing a bimalleolar ankle fracture. No callus formation seen.  Talus is under the tibia but translated slightly laterally.  Displaced and laterally translated lateral malleolus.  There is a displaced medial malleolus fracture. Alignment appears similar to prior films. No new fractures seen.     PATIENT SURVEYS:  LEFS 14/80  COGNITION: Overall cognitive status: Within functional limits for tasks assessed     SENSATION: WFL  EDEMA:  Mild R lateral malleolus  POSTURE: weight shift left and hyperextends L knee, ER L hip and ankle  PALPATION: Non tender with palpation R ankle or achilles  LOWER EXTREMITY ROM:  Passive ROM Right eval Right 01/22/23  Hip flexion    Hip extension    Hip abduction    Hip adduction    Hip internal rotation    Hip external rotation    Knee flexion    Knee extension    Ankle dorsiflexion -15 -16*  Ankle plantarflexion 22 40*  Ankle inversion 5 15*  Ankle eversion 0 12*   (Blank rows = not tested)  LOWER EXTREMITY MMT: B hips, knee musculature grossly 4/5 L ankle NT R ankle plantarflexion 3-/5 DF 3-/5 INV 3-/5  01/22/23-  R DF 4 in available ROM, inversion/eversion 4+/5 in available ROM, R quads 4+/5, R hams 4-/5, L quad 4-/5, L hams 3+/5   FUNCTIONAL TESTS:  NA  GAIT: Distance walked: with FWW x 100' in clnic Assistive device utilized: Environmental consultant - 2 wheeled Level of assistance: SBA Comments: cues for step to pattern and sequencing leading with R LE  Patient also using knee scooter upon arrival and to maneuver throughout clnic   TODAY'S TREATMENT:                                                                                                                              DATE: 01/27/23:   Manual:  Ankle mobilizations subtalar jt, for dorsiflexion, gentle, to tolerance Side lying for myofascial release/cross friction mass medial R gastroc attachment along rib  Recumbent cycle 6 min level 7 Standing L  foot on 1" block, R foot back for gastroc stretch/ lunges to tolerance mass practice  Standing L foot on 4" block, R foot planted, in walker, with light UE support and horizontal head turns B, pt with pain R subtalar jt so discontinued Heel/toe rocks B feet on airex in walker to tolerance, mass  practice Seated leg press on fitter 2 bands for resistance , R LE 15 x 2 Seated hamstring curls green band, 15 x  2 sets   01/22/23  Objective measures, appropriate education, brief goal review  TherEx  Seated rockerboard x20 with 3-5 seconds holds in each position Seated rockerboard x20 inversion/eversion 3-5 second holds each position  Gastroc stretch 4x30 seconds  Standing wt shifts feet shoulder width apart x10  Heel-toe rocking x10 with L foot forward, x10 with R foot forward   Manual   Ankle dorsiflexion mobs grade III  Heel inversion/eversion grade III Forefoot mobility grade III     01/15/23 Seated toy yoga R x 10  Toe curls 2x10 BAPS fwd, circles x 10 bil PF with blue TB x 10  Knee extension with blue TB x 10 R Recumbent Bike L7x99min Fwd WS onto 4' step while sitting x 10  Heel slide RLE x 10   01/13/23: Measured dorsiflexion -8 today initially Seated for BAPS board R ankle cw, ccw  Seated picking up marbles R foot Toe yoga R Wash cloth scrunches R foot on floor Standing for side stepping along counter in crocs Standing weight shifting side to side, cues to maintain neutral hip position as she tends to flex R hip to accommodate for R ankle plantarflexion position.  Standing with B forefeet on 1/2" book for heel raises, holding counter to isolate B plantarflexor strength Seated resisted long arc quads with green band Recumbent bike. Level 5 for 5 min, then level 7 for 2 min  01/08/23 Dorsiflexion -15 initially , improved to -5 after stretching and therex Manual: brief stretching R ankle , mid foot, into dorsiflexion and metatarsal flexion   Therex: reviewed ex from  initial session:    Seated ankle pumps with green t band Standing R ankle dorsiflexion stretch, runner's stretch in standing, with theraband across R ant ankle to stabilize subtalar jt and isolate R ankle plantarflexors. Added sit to stand from elevated table with hip adductor ball squeeze, to further isolate quads musculature Seated long arc quads with 4#  10 x , therapist stopped due to crepitus R patella Seated ball rolls with plantar foot on ball surface multiple directions, encouraged to engage intrinsics R foot /ankle  Recumbent bike level 6 x  10 min Instructed and practiced stair climbing, up and down one step with b UE support. In the boot.   12/30/22  Eval, inst in the exercises as described below.  Brief gait training with boot and clinic's front wheeled walker.  Provided with adjustable heel lift to use in boot for gradual R ankle dorsiflexion stretch, advised to remove wedges as tolerated.  Advised to obtain FWW for home, patient states she has a 3 wheeled walker but unsteady.  Can borrow FWW from family member  PATIENT EDUCATION:  Education details: POC, goals Person educated: Patient Education method: Explanation, Demonstration, Tactile cues, Verbal cues, and Handouts Education comprehension: verbalized understanding, returned demonstration, verbal cues required, tactile cues required, and needs further education  HOME EXERCISE PROGRAM: Access Code: [email or web address] URL: [email or web address] Date: 01/08/2023 Prepared by: Linton Rump Laakea Pereira  Exercises - Seated Hip Adduction Squeeze with Ball  - 1 x daily - 7 x weekly - 3 sets - 10 reps - Sit to Stand Without Arm Support  - 1 x daily - 7 x weekly - 3 sets - 10 reps Access Code: [email or web address] URL: [email or web address] Date: 12/30/2022 Prepared by: Caralee Ates  Exercises - Long Sitting Ankle Plantar Flexion with Resistance  -  1 x daily - 7 x weekly - 3 sets - 10 reps red t band ,progress to green - Standing Knee  Flexion Stretch on Step  - 1 x daily - 7 x weekly - 3 sets - 10 reps  ASSESSMENT:  CLINICAL IMPRESSION:  Zo reports soreness L ankle which has been chronic, did have another fall out of bed thi week when she missed her scooter. Is trying to get in for cataract surgery, trying to schedule.  To see her orthopedist in about 3 weeks.  Tried to advance more weight bearing activities today a she responded well to these activities last visit. She is making progress towards her goals in general, will really benefit from continuation of skilled PT services especially given recent frequency of falls. Will continue to progress as able.   OBJECTIVE IMPAIRMENTS: decreased activity tolerance, decreased balance, decreased mobility, difficulty walking, decreased ROM, decreased strength, impaired perceived functional ability, impaired flexibility, postural dysfunction, and pain.   ACTIVITY LIMITATIONS: carrying, standing, squatting, stairs, transfers, bathing, toileting, locomotion level, and caring for others  PARTICIPATION LIMITATIONS: meal prep, cleaning, laundry, driving, shopping, community activity, and yard work  PERSONAL FACTORS: Fitness, Past/current experiences, Time since onset of injury/illness/exacerbation, Transportation, and 1-2 comorbidities: smoker,emphysema, s/o osteomyelitis L ankle  are also affecting patient's functional outcome.   REHAB POTENTIAL: Fair due to medical co morbidities  CLINICAL DECISION MAKING: Evolving/moderate complexity  EVALUATION COMPLEXITY: Moderate   GOALS: Goals reviewed with patient? Yes  SHORT TERM GOALS: Target date: 2 weeks, 01/13/23 I HEP Baseline: Goal status: MET 01/22/23  LONG TERM GOALS: Target date: 03/03/23  Gait speed greater than 1 m/sec with LAD over 800' for community distances Baseline: less than 0.5 m/sec Goal status: INITIAL  2.  LEFS Baseline: 14/80 Goal status: INITIAL  3.  Improve R ankle dorsiflexion ROM from -15 degrees to  neutral for improved walking mechanics and transfer efficiency Baseline:  Goal status: ONGOING 01/22/23  4.  Strength B hips, knees 5/5 for improved endurance, activity tolerance, balance Baseline:4/5  Goal status: ONGOING 01/22/23  5.  Strength R ankle 3+5 all planes  Baseline: 3-/5 Goal status: INITIAL   PLAN: + PT FREQUENCY: 1-2x/week  PT DURATION: 8 weeks  PLANNED INTERVENTIONS: Therapeutic exercises, Therapeutic activity, Neuromuscular re-education, Balance training, Gait training, Patient/Family education, Self Care, and Joint mobilization  PLAN FOR NEXT SESSION: progress with the cardiovascular equipment in the gym, progress with therex to engage, strengthen R ankle, progress with gait training. Consider Jass brace for ankle?   Caralee Ates, PT, DPT, OCS 01/27/23 12:07 PM

## 2023-01-29 ENCOUNTER — Encounter: Payer: Self-pay | Admitting: Internal Medicine

## 2023-01-29 ENCOUNTER — Ambulatory Visit: Payer: 59

## 2023-01-29 ENCOUNTER — Ambulatory Visit (AMBULATORY_SURGERY_CENTER): Payer: 59

## 2023-01-29 VITALS — Ht 64.0 in | Wt 135.0 lb

## 2023-01-29 DIAGNOSIS — S82891A Other fracture of right lower leg, initial encounter for closed fracture: Secondary | ICD-10-CM

## 2023-01-29 DIAGNOSIS — M25671 Stiffness of right ankle, not elsewhere classified: Secondary | ICD-10-CM

## 2023-01-29 DIAGNOSIS — Z1211 Encounter for screening for malignant neoplasm of colon: Secondary | ICD-10-CM

## 2023-01-29 DIAGNOSIS — R262 Difficulty in walking, not elsewhere classified: Secondary | ICD-10-CM

## 2023-01-29 NOTE — Therapy (Signed)
OUTPATIENT PHYSICAL THERAPY LOWER EXTREMITY TREATMENT   Patient Name: Brianna Bowman MRN: [id number] DOB:07-16-1970, 52 y.o., female Today's Date: 01/29/2023  END OF SESSION:  PT End of Session - 01/29/23 1626     Visit Number 7    Date for PT Re-Evaluation 02/24/23    Progress Note Due on Visit 10    PT Start Time 1621    PT Stop Time 1705    PT Time Calculation (min) 44 min    Activity Tolerance Patient tolerated treatment well    Behavior During Therapy WFL for tasks assessed/performed                   Past Medical History:  Diagnosis Date   Allergy    Anemia    with pregnancy   Anxiety    due to condition   Arthritis    Cataract    Depression    GERD (gastroesophageal reflux disease)    Headache(784.0)    history of   Hyperlipidemia    PONV (postoperative nausea and vomiting)    with epidural anad 04/28/13 BP low and took longer to wake after surgery 04/28/2012   Past Surgical History:  Procedure Laterality Date   Broken ankle Right 2024   FRACTURE SURGERY     L femur   HARDWARE REMOVAL Left 11/01/2012   Procedure: HARDWARE REMOVAL;  Surgeon: Eldred Manges, MD;  Location: [facility] OR;  Service: Orthopedics;  Laterality: Left;  Left Ankle Removal Fibula Plate and 10 screws, Irrigation and Debridement   MANDIBLE FRACTURE SURGERY     ORIF ANKLE FRACTURE  04/28/2012   Procedure: OPEN REDUCTION INTERNAL FIXATION (ORIF) ANKLE FRACTURE;  Surgeon: Eldred Manges, MD;  Location: [facility] OR;  Service: Orthopedics;  Laterality: Left;  Open reduction internal fixation left ankle bimalleolar fracture   Patient Active Problem List   Diagnosis Date Noted   Motion sickness 12/15/2022   White matter abnormality on MRI of brain 09/22/2022   Chronic migraine w/o aura, not intractable, w/o stat migr 09/22/2022   Opioid use 09/10/2022   Acute stress reaction 09/10/2022   Pink-colored urine 09/10/2022   Closed displaced trimalleolar fracture of lower leg, right, initial encounter  08/23/2022   Allergic reaction 08/22/2022   Screening for diabetes mellitus 08/12/2022   Hyperlipidemia 08/12/2022   Elevated blood pressure reading 08/12/2022   Need for hepatitis C screening test 08/12/2022   Encounter for screening mammogram for malignant neoplasm of breast 08/12/2022   Colon cancer screening 08/12/2022   Blurry vision 08/12/2022   Ankle pain, chronic 08/12/2022   Need for diphtheria-tetanus-pertussis (Tdap) vaccine 08/12/2022   Chest pain 02/09/2018   Anxiety state 02/09/2018   History of migraine headaches 02/09/2018   Emphysema of lung (HCC) 02/09/2018   Neuralgia 06/12/2017   Tobacco abuse 11/29/2012   Bone infection of left ankle (HCC) 11/01/2012   Closed fracture of left ankle 04/28/2012    Class: Acute    PCP: Modena Slater, MD  REFERRING PROVIDER: Willia Craze, MD  REFERRING DIAG: R trimalleolar ankle fracture  THERAPY DIAG:  Difficulty in walking, not elsewhere classified  Closed fracture of right ankle, initial encounter  Stiffness of right ankle, not elsewhere classified  Rationale for Evaluation and Treatment: Rehabilitation  ONSET DATE: 08/23/2022  SUBJECTIVE:   SUBJECTIVE STATEMENT: Pt reports that she had been doing better.   PERTINENT HISTORY: Injured R ankle on slide, did not undergo surgical fixation, now 4 months post op PAIN:  Are you having  pain? Yes: NPRS scale:  3 R/10 Pain location: b ankles  Pain description: L ache, R throbs and constantly hurts  Aggravating factors: weight bearing Relieving factors: sitting  PRECAUTIONS: Fall  WEIGHT BEARING RESTRICTIONS: Yes WBAT advance from boot to shoe gradually  FALLS:  Has patient fallen in last 6 months? Yes. Number of falls 2  LIVING ENVIRONMENT: Lives with: lives with their family Lives in: House/apartment Stairs: Yes: External: 1 .5 steps; on right going up Has following equipment at home:  knee scooter, 3 wheeled walker  OCCUPATION: disabled  PLOF: Independent  with basic ADLs  PATIENT GOALS: get R ankle to heal and be able to walk without device  NEXT MD VISIT: 5 weeks, Aug 2024  OBJECTIVE:   DIAGNOSTIC FINDINGS: XRs of the right ankle taken 12/26/2022 were independently reviewed and interpreted, showing a bimalleolar ankle fracture. No callus formation seen.  Talus is under the tibia but translated slightly laterally.  Displaced and laterally translated lateral malleolus.  There is a displaced medial malleolus fracture. Alignment appears similar to prior films. No new fractures seen.     PATIENT SURVEYS:  LEFS 14/80  COGNITION: Overall cognitive status: Within functional limits for tasks assessed     SENSATION: WFL  EDEMA:  Mild R lateral malleolus  POSTURE: weight shift left and hyperextends L knee, ER L hip and ankle  PALPATION: Non tender with palpation R ankle or achilles  LOWER EXTREMITY ROM:  Passive ROM Right eval Right 01/22/23  Hip flexion    Hip extension    Hip abduction    Hip adduction    Hip internal rotation    Hip external rotation    Knee flexion    Knee extension    Ankle dorsiflexion -15 -16*  Ankle plantarflexion 22 40*  Ankle inversion 5 15*  Ankle eversion 0 12*   (Blank rows = not tested)  LOWER EXTREMITY MMT: B hips, knee musculature grossly 4/5 L ankle NT R ankle plantarflexion 3-/5 DF 3-/5 INV 3-/5  01/22/23-  R DF 4 in available ROM, inversion/eversion 4+/5 in available ROM, R quads 4+/5, R hams 4-/5, L quad 4-/5, L hams 3+/5   FUNCTIONAL TESTS:  NA  GAIT: Distance walked: with FWW x 100' in clnic Assistive device utilized: Environmental consultant - 2 wheeled Level of assistance: SBA Comments: cues for step to pattern and sequencing leading with R LE  Patient also using knee scooter upon arrival and to maneuver throughout clnic   TODAY'S TREATMENT:                                                                                                                              DATE: 01/29/23 Recumbent Bike  L7x3min 4 way ankle weight shifts x 10  Fwd step on airex then weight shift x 10  Retro step with RW x 10  Gastroc stretch 2x30 sec  Supine SLR x 12 bil Supine clamshell blue TB 10x3"  01/27/23:  Manual:  Ankle mobilizations subtalar jt, for dorsiflexion, gentle, to tolerance Side lying for myofascial release/cross friction mass medial R gastroc attachment along rib  Recumbent cycle 6 min level 7 Standing L foot on 1" block, R foot back for gastroc stretch/ lunges to tolerance mass practice  Standing L foot on 4" block, R foot planted, in walker, with light UE support and horizontal head turns B, pt with pain R subtalar jt so discontinued Heel/toe rocks B feet on airex in walker to tolerance, mass practice Seated leg press on fitter 2 bands for resistance , R LE 15 x 2 Seated hamstring curls green band, 15 x  2 sets   01/22/23  Objective measures, appropriate education, brief goal review  TherEx  Seated rockerboard x20 with 3-5 seconds holds in each position Seated rockerboard x20 inversion/eversion 3-5 second holds each position  Gastroc stretch 4x30 seconds  Standing wt shifts feet shoulder width apart x10  Heel-toe rocking x10 with L foot forward, x10 with R foot forward   Manual   Ankle dorsiflexion mobs grade III  Heel inversion/eversion grade III Forefoot mobility grade III     01/15/23 Seated toy yoga R x 10  Toe curls 2x10 BAPS fwd, circles x 10 bil PF with blue TB x 10  Knee extension with blue TB x 10 R Recumbent Bike L7x60min Fwd WS onto 4' step while sitting x 10  Heel slide RLE x 10   01/13/23: Measured dorsiflexion -8 today initially Seated for BAPS board R ankle cw, ccw  Seated picking up marbles R foot Toe yoga R Wash cloth scrunches R foot on floor Standing for side stepping along counter in crocs Standing weight shifting side to side, cues to maintain neutral hip position as she tends to flex R hip to accommodate for R ankle plantarflexion  position.  Standing with B forefeet on 1/2" book for heel raises, holding counter to isolate B plantarflexor strength Seated resisted long arc quads with green band Recumbent bike. Level 5 for 5 min, then level 7 for 2 min  01/08/23 Dorsiflexion -15 initially , improved to -5 after stretching and therex Manual: brief stretching R ankle , mid foot, into dorsiflexion and metatarsal flexion   Therex: reviewed ex from initial session:    Seated ankle pumps with green t band Standing R ankle dorsiflexion stretch, runner's stretch in standing, with theraband across R ant ankle to stabilize subtalar jt and isolate R ankle plantarflexors. Added sit to stand from elevated table with hip adductor ball squeeze, to further isolate quads musculature Seated long arc quads with 4#  10 x , therapist stopped due to crepitus R patella Seated ball rolls with plantar foot on ball surface multiple directions, encouraged to engage intrinsics R foot /ankle  Recumbent bike level 6 x  10 min Instructed and practiced stair climbing, up and down one step with b UE support. In the boot.   12/30/22  Eval, inst in the exercises as described below.  Brief gait training with boot and clinic's front wheeled walker.  Provided with adjustable heel lift to use in boot for gradual R ankle dorsiflexion stretch, advised to remove wedges as tolerated.  Advised to obtain FWW for home, patient states she has a 3 wheeled walker but unsteady.  Can borrow FWW from family member  PATIENT EDUCATION:  Education details: POC, goals Person educated: Patient Education method: Explanation, Demonstration, Tactile cues, Verbal cues, and Handouts Education comprehension: verbalized understanding, returned demonstration, verbal cues required, tactile  cues required, and needs further education  HOME EXERCISE PROGRAM: Access Code: [email or web address] URL: [email or web address] Date: 01/08/2023 Prepared by: Linton Rump Speaks  Exercises - Seated  Hip Adduction Squeeze with Ball  - 1 x daily - 7 x weekly - 3 sets - 10 reps - Sit to Stand Without Arm Support  - 1 x daily - 7 x weekly - 3 sets - 10 reps Access Code: [email or web address] URL: [email or web address] Date: 01/29/2023 Prepared by: Verta Ellen  Exercises - Seated Hip Adduction Squeeze with Ball  - 1 x daily - 7 x weekly - 3 sets - 10 reps - Sit to Stand Without Arm Support  - 1 x daily - 7 x weekly - 3 sets - 10 reps - Hooklying Clamshell with Resistance  - 1 x daily - 7 x weekly - 2-3 sets - 10 reps - Active Straight Leg Raise with Quad Set  - 1 x daily - 7 x weekly - 2-3 sets - 10 reps  ASSESSMENT:  CLINICAL IMPRESSION:  Continued progressing exercises to improve gait, pain in ankles, and function. Cues needed for posture with the standing exercises. We added hip strengthening for proximal stability and updated HEP. Tried walking with cane , very slow gait and unsteady (lost balance once but recovered herself). Advised her to continue using walker for ambulation.  OBJECTIVE IMPAIRMENTS: decreased activity tolerance, decreased balance, decreased mobility, difficulty walking, decreased ROM, decreased strength, impaired perceived functional ability, impaired flexibility, postural dysfunction, and pain.   ACTIVITY LIMITATIONS: carrying, standing, squatting, stairs, transfers, bathing, toileting, locomotion level, and caring for others  PARTICIPATION LIMITATIONS: meal prep, cleaning, laundry, driving, shopping, community activity, and yard work  PERSONAL FACTORS: Fitness, Past/current experiences, Time since onset of injury/illness/exacerbation, Transportation, and 1-2 comorbidities: smoker,emphysema, s/o osteomyelitis L ankle  are also affecting patient's functional outcome.   REHAB POTENTIAL: Fair due to medical co morbidities  CLINICAL DECISION MAKING: Evolving/moderate complexity  EVALUATION COMPLEXITY: Moderate   GOALS: Goals reviewed with patient?  Yes  SHORT TERM GOALS: Target date: 2 weeks, 01/13/23 I HEP Baseline: Goal status: MET 01/22/23  LONG TERM GOALS: Target date: 03/03/23  Gait speed greater than 1 m/sec with LAD over 800' for community distances Baseline: less than 0.5 m/sec Goal status: INITIAL  2.  LEFS Baseline: 14/80 Goal status: INITIAL  3.  Improve R ankle dorsiflexion ROM from -15 degrees to neutral for improved walking mechanics and transfer efficiency Baseline:  Goal status: ONGOING 01/22/23  4.  Strength B hips, knees 5/5 for improved endurance, activity tolerance, balance Baseline:4/5  Goal status: ONGOING 01/22/23  5.  Strength R ankle 3+5 all planes  Baseline: 3-/5 Goal status: INITIAL   PLAN: + PT FREQUENCY: 1-2x/week  PT DURATION: 8 weeks  PLANNED INTERVENTIONS: Therapeutic exercises, Therapeutic activity, Neuromuscular re-education, Balance training, Gait training, Patient/Family education, Self Care, and Joint mobilization  PLAN FOR NEXT SESSION: progress with the cardiovascular equipment in the gym, progress with therex to engage, strengthen R ankle, progress with gait training. Consider Jass brace for ankle?   Darleene Cleaver, PTA  01/29/23 5:22 PM

## 2023-01-29 NOTE — Progress Notes (Signed)
No egg or soy allergy known to patient  No issues known to pt with past sedation with any surgeries or procedures Patient denies ever being told they had issues or difficulty with intubation  No FH of Malignant Hyperthermia Pt is not on diet pills Pt is not on  home 02  Pt is not on blood thinners  Pt denies issues with constipation takes a stool softner No A fib or A flutter Have any cardiac testing pending--no Pt can ambulates with walker with broken ankle until beginning of September Pt denies use of chewing tobacco Discussed diabetic I weight loss medication holds Discussed NSAID holds Checked BMI Pt instructed to use Single[employer]com or GoodRx for a price reduction on prep  Patient's chart reviewed by Cathlyn Parsons CNRA prior to previsit and patient appropriate for the [facility].  Pre visit completed and red dot placed by patient's name on their procedure day (on provider's schedule).

## 2023-02-02 ENCOUNTER — Ambulatory Visit: Payer: 59

## 2023-02-02 DIAGNOSIS — R262 Difficulty in walking, not elsewhere classified: Secondary | ICD-10-CM | POA: Diagnosis not present

## 2023-02-02 DIAGNOSIS — M25671 Stiffness of right ankle, not elsewhere classified: Secondary | ICD-10-CM

## 2023-02-02 DIAGNOSIS — S82891A Other fracture of right lower leg, initial encounter for closed fracture: Secondary | ICD-10-CM

## 2023-02-02 NOTE — Therapy (Signed)
OUTPATIENT PHYSICAL THERAPY LOWER EXTREMITY TREATMENT   Patient Name: Brianna Bowman MRN: [id number] DOB:Sep 01, 1970, 52 y.o., female Today's Date: 02/02/2023  END OF SESSION:  PT End of Session - 02/02/23 1623     Visit Number 8    Date for PT Re-Evaluation 02/24/23    Progress Note Due on Visit 10    PT Start Time 1619    PT Stop Time 1702    PT Time Calculation (min) 43 min    Activity Tolerance Patient tolerated treatment well    Behavior During Therapy WFL for tasks assessed/performed                    Past Medical History:  Diagnosis Date   Allergy    Anemia    with pregnancy   Anxiety    due to condition   Arthritis    Cataract    Depression    GERD (gastroesophageal reflux disease)    Headache(784.0)    history of   Hyperlipidemia    PONV (postoperative nausea and vomiting)    with epidural anad 04/28/13 BP low and took longer to wake after surgery 04/28/2012   Past Surgical History:  Procedure Laterality Date   Broken ankle Right 2024   FRACTURE SURGERY     L femur   HARDWARE REMOVAL Left 11/01/2012   Procedure: HARDWARE REMOVAL;  Surgeon: Eldred Manges, MD;  Location: [facility] OR;  Service: Orthopedics;  Laterality: Left;  Left Ankle Removal Fibula Plate and 10 screws, Irrigation and Debridement   MANDIBLE FRACTURE SURGERY     ORIF ANKLE FRACTURE  04/28/2012   Procedure: OPEN REDUCTION INTERNAL FIXATION (ORIF) ANKLE FRACTURE;  Surgeon: Eldred Manges, MD;  Location: [facility] OR;  Service: Orthopedics;  Laterality: Left;  Open reduction internal fixation left ankle bimalleolar fracture   Patient Active Problem List   Diagnosis Date Noted   Motion sickness 12/15/2022   White matter abnormality on MRI of brain 09/22/2022   Chronic migraine w/o aura, not intractable, w/o stat migr 09/22/2022   Opioid use 09/10/2022   Acute stress reaction 09/10/2022   Pink-colored urine 09/10/2022   Closed displaced trimalleolar fracture of lower leg, right, initial encounter  08/23/2022   Allergic reaction 08/22/2022   Screening for diabetes mellitus 08/12/2022   Hyperlipidemia 08/12/2022   Elevated blood pressure reading 08/12/2022   Need for hepatitis C screening test 08/12/2022   Encounter for screening mammogram for malignant neoplasm of breast 08/12/2022   Colon cancer screening 08/12/2022   Blurry vision 08/12/2022   Ankle pain, chronic 08/12/2022   Need for diphtheria-tetanus-pertussis (Tdap) vaccine 08/12/2022   Chest pain 02/09/2018   Anxiety state 02/09/2018   History of migraine headaches 02/09/2018   Emphysema of lung (HCC) 02/09/2018   Neuralgia 06/12/2017   Tobacco abuse 11/29/2012   Bone infection of left ankle (HCC) 11/01/2012   Closed fracture of left ankle 04/28/2012    Class: Acute    PCP: Modena Slater, MD  REFERRING PROVIDER: Willia Craze, MD  REFERRING DIAG: R trimalleolar ankle fracture  THERAPY DIAG:  Difficulty in walking, not elsewhere classified  Closed fracture of right ankle, initial encounter  Stiffness of right ankle, not elsewhere classified  Rationale for Evaluation and Treatment: Rehabilitation  ONSET DATE: 08/23/2022  SUBJECTIVE:   SUBJECTIVE STATEMENT: Pt reports that she didn't do any exercises today or do much walking.  PERTINENT HISTORY: Injured R ankle on slide, did not undergo surgical fixation, now 4 months post op  PAIN:  Are you having pain? Yes: NPRS scale: 0/10 Pain location: b ankles  Pain description: L ache, R throbs and constantly hurts  Aggravating factors: weight bearing Relieving factors: sitting  PRECAUTIONS: Fall  WEIGHT BEARING RESTRICTIONS: Yes WBAT advance from boot to shoe gradually  FALLS:  Has patient fallen in last 6 months? Yes. Number of falls 2  LIVING ENVIRONMENT: Lives with: lives with their family Lives in: House/apartment Stairs: Yes: External: 1 .5 steps; on right going up Has following equipment at home:  knee scooter, 3 wheeled walker  OCCUPATION:  disabled  PLOF: Independent with basic ADLs  PATIENT GOALS: get R ankle to heal and be able to walk without device  NEXT MD VISIT: 5 weeks, Aug 2024  OBJECTIVE:   DIAGNOSTIC FINDINGS: XRs of the right ankle taken 12/26/2022 were independently reviewed and interpreted, showing a bimalleolar ankle fracture. No callus formation seen.  Talus is under the tibia but translated slightly laterally.  Displaced and laterally translated lateral malleolus.  There is a displaced medial malleolus fracture. Alignment appears similar to prior films. No new fractures seen.     PATIENT SURVEYS:  LEFS 14/80  COGNITION: Overall cognitive status: Within functional limits for tasks assessed     SENSATION: WFL  EDEMA:  Mild R lateral malleolus  POSTURE: weight shift left and hyperextends L knee, ER L hip and ankle  PALPATION: Non tender with palpation R ankle or achilles  LOWER EXTREMITY ROM:  Passive ROM Right eval Right 01/22/23  Hip flexion    Hip extension    Hip abduction    Hip adduction    Hip internal rotation    Hip external rotation    Knee flexion    Knee extension    Ankle dorsiflexion -15 -16*  Ankle plantarflexion 22 40*  Ankle inversion 5 15*  Ankle eversion 0 12*   (Blank rows = not tested)  LOWER EXTREMITY MMT: B hips, knee musculature grossly 4/5 L ankle NT R ankle plantarflexion 3-/5 DF 3-/5 INV 3-/5  01/22/23-  R DF 4 in available ROM, inversion/eversion 4+/5 in available ROM, R quads 4+/5, R hams 4-/5, L quad 4-/5, L hams 3+/5   FUNCTIONAL TESTS:  NA  GAIT: Distance walked: with FWW x 100' in clnic Assistive device utilized: Environmental consultant - 2 wheeled Level of assistance: SBA Comments: cues for step to pattern and sequencing leading with R LE  Patient also using knee scooter upon arrival and to maneuver throughout clnic   TODAY'S TREATMENT:                                                                                                                               DATE: 02/02/23 Nustep L4x71min Gait around clinic 180 ft with rolling walker- cues for equal step length Fwd step on airex then weight shift x 10  Retro step x 15 with RW Gastroc stretch runner stretch and heel drop from book Supine clams  blue TB 10x5" Supine SLR 2x10 bil  01/29/23 Recumbent Bike L7x70min 4 way ankle weight shifts x 10  Fwd step on airex then weight shift x 10  Retro step with RW x 10  Gastroc stretch 2x30 sec  Supine SLR x 12 bil Supine clamshell blue TB 10x3"  01/27/23:   Manual:  Ankle mobilizations subtalar jt, for dorsiflexion, gentle, to tolerance Side lying for myofascial release/cross friction mass medial R gastroc attachment along rib  Recumbent cycle 6 min level 7 Standing L foot on 1" block, R foot back for gastroc stretch/ lunges to tolerance mass practice  Standing L foot on 4" block, R foot planted, in walker, with light UE support and horizontal head turns B, pt with pain R subtalar jt so discontinued Heel/toe rocks B feet on airex in walker to tolerance, mass practice Seated leg press on fitter 2 bands for resistance , R LE 15 x 2 Seated hamstring curls green band, 15 x  2 sets   01/22/23  Objective measures, appropriate education, brief goal review  TherEx  Seated rockerboard x20 with 3-5 seconds holds in each position Seated rockerboard x20 inversion/eversion 3-5 second holds each position  Gastroc stretch 4x30 seconds  Standing wt shifts feet shoulder width apart x10  Heel-toe rocking x10 with L foot forward, x10 with R foot forward   Manual   Ankle dorsiflexion mobs grade III  Heel inversion/eversion grade III Forefoot mobility grade III     01/15/23 Seated toy yoga R x 10  Toe curls 2x10 BAPS fwd, circles x 10 bil PF with blue TB x 10  Knee extension with blue TB x 10 R Recumbent Bike L7x71min Fwd WS onto 4' step while sitting x 10  Heel slide RLE x 10   01/13/23: Measured dorsiflexion -8 today initially Seated for BAPS  board R ankle cw, ccw  Seated picking up marbles R foot Toe yoga R Wash cloth scrunches R foot on floor Standing for side stepping along counter in crocs Standing weight shifting side to side, cues to maintain neutral hip position as she tends to flex R hip to accommodate for R ankle plantarflexion position.  Standing with B forefeet on 1/2" book for heel raises, holding counter to isolate B plantarflexor strength Seated resisted long arc quads with green band Recumbent bike. Level 5 for 5 min, then level 7 for 2 min  01/08/23 Dorsiflexion -15 initially , improved to -5 after stretching and therex Manual: brief stretching R ankle , mid foot, into dorsiflexion and metatarsal flexion   Therex: reviewed ex from initial session:    Seated ankle pumps with green t band Standing R ankle dorsiflexion stretch, runner's stretch in standing, with theraband across R ant ankle to stabilize subtalar jt and isolate R ankle plantarflexors. Added sit to stand from elevated table with hip adductor ball squeeze, to further isolate quads musculature Seated long arc quads with 4#  10 x , therapist stopped due to crepitus R patella Seated ball rolls with plantar foot on ball surface multiple directions, encouraged to engage intrinsics R foot /ankle  Recumbent bike level 6 x  10 min Instructed and practiced stair climbing, up and down one step with b UE support. In the boot.   12/30/22  Eval, inst in the exercises as described below.  Brief gait training with boot and clinic's front wheeled walker.  Provided with adjustable heel lift to use in boot for gradual R ankle dorsiflexion stretch, advised to remove wedges as  tolerated.  Advised to obtain FWW for home, patient states she has a 3 wheeled walker but unsteady.  Can borrow FWW from family member  PATIENT EDUCATION:  Education details: POC, goals Person educated: Patient Education method: Explanation, Demonstration, Tactile cues, Verbal cues, and  Handouts Education comprehension: verbalized understanding, returned demonstration, verbal cues required, tactile cues required, and needs further education  HOME EXERCISE PROGRAM: Access Code: [email or web address] URL: [email or web address] Date: 01/08/2023 Prepared by: Linton Rump Speaks  Exercises - Seated Hip Adduction Squeeze with Ball  - 1 x daily - 7 x weekly - 3 sets - 10 reps - Sit to Stand Without Arm Support  - 1 x daily - 7 x weekly - 3 sets - 10 reps Access Code: [email or web address] URL: [email or web address] Date: 01/29/2023 Prepared by: Verta Ellen  Exercises - Seated Hip Adduction Squeeze with Ball  - 1 x daily - 7 x weekly - 3 sets - 10 reps - Sit to Stand Without Arm Support  - 1 x daily - 7 x weekly - 3 sets - 10 reps - Hooklying Clamshell with Resistance  - 1 x daily - 7 x weekly - 2-3 sets - 10 reps - Active Straight Leg Raise with Quad Set  - 1 x daily - 7 x weekly - 2-3 sets - 10 reps  ASSESSMENT:  CLINICAL IMPRESSION:  Continued progressing exercises to improve gait, strength and function. She does have trouble with full WB on RLE, walks with antalgic gait and step to pattern. Pt only reported mild pain after interventions. She shows the need for proximal LE strengthening with instability in WB positions and ankle strength to decrease pain when weight bearing.  OBJECTIVE IMPAIRMENTS: decreased activity tolerance, decreased balance, decreased mobility, difficulty walking, decreased ROM, decreased strength, impaired perceived functional ability, impaired flexibility, postural dysfunction, and pain.   ACTIVITY LIMITATIONS: carrying, standing, squatting, stairs, transfers, bathing, toileting, locomotion level, and caring for others  PARTICIPATION LIMITATIONS: meal prep, cleaning, laundry, driving, shopping, community activity, and yard work  PERSONAL FACTORS: Fitness, Past/current experiences, Time since onset of injury/illness/exacerbation, Transportation, and  1-2 comorbidities: smoker,emphysema, s/o osteomyelitis L ankle  are also affecting patient's functional outcome.   REHAB POTENTIAL: Fair due to medical co morbidities  CLINICAL DECISION MAKING: Evolving/moderate complexity  EVALUATION COMPLEXITY: Moderate   GOALS: Goals reviewed with patient? Yes  SHORT TERM GOALS: Target date: 2 weeks, 01/13/23 I HEP Baseline: Goal status: MET 01/22/23  LONG TERM GOALS: Target date: 03/03/23  Gait speed greater than 1 m/sec with LAD over 800' for community distances Baseline: less than 0.5 m/sec Goal status: INITIAL  2.  LEFS Baseline: 14/80 Goal status: INITIAL  3.  Improve R ankle dorsiflexion ROM from -15 degrees to neutral for improved walking mechanics and transfer efficiency Baseline:  Goal status: ONGOING 01/22/23  4.  Strength B hips, knees 5/5 for improved endurance, activity tolerance, balance Baseline:4/5  Goal status: ONGOING 01/22/23  5.  Strength R ankle 3+5 all planes  Baseline: 3-/5 Goal status: INITIAL   PLAN: + PT FREQUENCY: 1-2x/week  PT DURATION: 8 weeks  PLANNED INTERVENTIONS: Therapeutic exercises, Therapeutic activity, Neuromuscular re-education, Balance training, Gait training, Patient/Family education, Self Care, and Joint mobilization  PLAN FOR NEXT SESSION: progress with therex to engage, strengthen R ankle, progress with gait training. Consider Jass brace for ankle?   Darleene Cleaver, PTA  02/02/23 5:18 PM

## 2023-02-04 ENCOUNTER — Encounter: Payer: Self-pay | Admitting: Internal Medicine

## 2023-02-04 ENCOUNTER — Ambulatory Visit: Payer: 59 | Admitting: Internal Medicine

## 2023-02-04 VITALS — BP 103/74 | HR 75 | Temp 97.8°F | Resp 11 | Ht 64.0 in | Wt 135.0 lb

## 2023-02-04 DIAGNOSIS — Z1211 Encounter for screening for malignant neoplasm of colon: Secondary | ICD-10-CM | POA: Diagnosis not present

## 2023-02-04 DIAGNOSIS — D125 Benign neoplasm of sigmoid colon: Secondary | ICD-10-CM

## 2023-02-04 DIAGNOSIS — K635 Polyp of colon: Secondary | ICD-10-CM | POA: Diagnosis not present

## 2023-02-04 MED ORDER — SODIUM CHLORIDE 0.9 % IV SOLN: 500.0000 mL | Freq: Once | INTRAVENOUS | Status: DC

## 2023-02-04 NOTE — Progress Notes (Unsigned)
[facility] History and Physical   Primary Care Physician:  Modena Slater, DO   Reason for Procedure:    Encounter Diagnosis  Name Primary?   Special screening for malignant neoplasms, colon Yes     Plan:    colonoscopy     HPI: Brianna Bowman is a 52 y.o. female here for screening colonoscopy   Past Medical History:  Diagnosis Date   Allergy    Anemia    with pregnancy   Anxiety    due to condition   Arthritis    Cataract    Depression    GERD (gastroesophageal reflux disease)    Headache(784.0)    history of   Hyperlipidemia    PONV (postoperative nausea and vomiting)    with epidural anad 04/28/13 BP low and took longer to wake after surgery 04/28/2012    Past Surgical History:  Procedure Laterality Date   Broken ankle Right 2024   FRACTURE SURGERY     L femur   HARDWARE REMOVAL Left 11/01/2012   Procedure: HARDWARE REMOVAL;  Surgeon: Eldred Manges, MD;  Location: [facility] OR;  Service: Orthopedics;  Laterality: Left;  Left Ankle Removal Fibula Plate and 10 screws, Irrigation and Debridement   MANDIBLE FRACTURE SURGERY     ORIF ANKLE FRACTURE  04/28/2012   Procedure: OPEN REDUCTION INTERNAL FIXATION (ORIF) ANKLE FRACTURE;  Surgeon: Eldred Manges, MD;  Location: [facility] OR;  Service: Orthopedics;  Laterality: Left;  Open reduction internal fixation left ankle bimalleolar fracture    Prior to Admission medications   Medication Sig Start Date End Date Taking? Authorizing Provider  aspirin EC 81 MG tablet Take 81-325 mg by mouth every 6 (six) hours as needed for moderate pain. Swallow whole.   Yes [provider]  Rimegepant Sulfate (NURTEC) 75 MG TBDP Take 1 tablet (75 mg total) by mouth as needed (at onset of migraine). No more than 1 tablet in 24 hours 11/13/22  Yes Sater, Pearletha Furl, MD  diclofenac Sodium (VOLTAREN ARTHRITIS PAIN) 1 % GEL Apply 2 g topically 4 (four) times daily. To left ankle 08/12/22   Modena Slater, DO  LORazepam (ATIVAN) 1 MG tablet  Take 1 tablet (1 mg total) by mouth every 8 (eight) hours as needed for anxiety. 01/01/23   London Sheer, MD  meclizine (ANTIVERT) 12.5 MG tablet Take 1 tablet (12.5 mg total) by mouth 3 (three) times daily as needed for dizziness. Patient not taking: Reported on 01/29/2023 12/11/22   Rocky Morel, DO  meloxicam (MOBIC) 7.5 MG tablet Take 7.5 mg by mouth daily.    [provider]  ondansetron (ZOFRAN) 4 MG tablet Take 1 tablet (4 mg total) by mouth daily as needed for nausea or vomiting. 12/11/22 12/11/23  Rocky Morel, DO  RESTASIS 0.05 % ophthalmic emulsion 1 drop 2 (two) times daily. 11/26/22   [provider]    Current Outpatient Medications  Medication Sig Dispense Refill   aspirin EC 81 MG tablet Take 81-325 mg by mouth every 6 (six) hours as needed for moderate pain. Swallow whole.     Rimegepant Sulfate (NURTEC) 75 MG TBDP Take 1 tablet (75 mg total) by mouth as needed (at onset of migraine). No more than 1 tablet in 24 hours 16 tablet 5   diclofenac Sodium (VOLTAREN ARTHRITIS PAIN) 1 % GEL Apply 2 g topically 4 (four) times daily. To left ankle 4 g 3   LORazepam (ATIVAN) 1 MG tablet Take 1 tablet (1 mg  total) by mouth every 8 (eight) hours as needed for anxiety. 20 tablet 0   meclizine (ANTIVERT) 12.5 MG tablet Take 1 tablet (12.5 mg total) by mouth 3 (three) times daily as needed for dizziness. (Patient not taking: Reported on 01/29/2023) 14 tablet 0   meloxicam (MOBIC) 7.5 MG tablet Take 7.5 mg by mouth daily.     ondansetron (ZOFRAN) 4 MG tablet Take 1 tablet (4 mg total) by mouth daily as needed for nausea or vomiting. 8 tablet 0   RESTASIS 0.05 % ophthalmic emulsion 1 drop 2 (two) times daily.     Current Facility-Administered Medications  Medication Dose Route Frequency Provider Last Rate Last Admin   0.9 %  sodium chloride infusion  500 mL Intravenous Once Iva Boop, MD        Allergies as of 02/04/2023 - Review Complete 02/04/2023  Allergen Reaction  Noted   Amoxicillin Anaphylaxis 04/23/2012   Adhesive [tape] Hives 04/23/2012   Aloe Hives 04/23/2012   Rizatriptan  11/10/2022   Shellfish allergy Rash 08/22/2022    Family History  Problem Relation Age of Onset   Cancer Mother    Multiple sclerosis Father    Pneumonia Father    Heart attack Maternal Grandfather        Died in his 23s   Colon cancer Paternal Grandfather    Colon polyps Neg Hx    Esophageal cancer Neg Hx    Rectal cancer Neg Hx    Stomach cancer Neg Hx     Social History   Socioeconomic History   Marital status: Married    Spouse name: Not on file   Number of children: Not on file   Years of education: Not on file   Highest education level: Not on file  Occupational History   Not on file  Tobacco Use   Smoking status: Every Day    Current packs/day: 1.00    Average packs/day: 1 pack/day for 20.0 years (20.0 ttl pk-yrs)    Types: Cigarettes   Smokeless tobacco: Never  Substance and Sexual Activity   Alcohol use: Yes    Comment: Occasional   Drug use: Never   Sexual activity: Not on file  Other Topics Concern   Not on file  Social History Narrative   Right Handed   1 cup of Coffee per day   2 Cans of soda   Social Determinants of Health   Financial Resource Strain: Low Risk  (08/12/2022)   Overall Financial Resource Strain (CARDIA)    Difficulty of Paying Living Expenses: Not hard at all  Food Insecurity: No Food Insecurity (08/12/2022)   Hunger Vital Sign    Worried About Running Out of Food in the Last Year: Never true    Ran Out of Food in the Last Year: Never true  Transportation Needs: No Transportation Needs (08/12/2022)   PRAPARE - Administrator, Civil Service (Medical): No    Lack of Transportation (Non-Medical): No  Physical Activity: Sufficiently Active (08/12/2022)   Exercise Vital Sign    Days of Exercise per Week: 7 days    Minutes of Exercise per Session: 60 min  Stress: Stress Concern Present (08/12/2022)    [employer] of Occupational Health - Occupational Stress Questionnaire    Feeling of Stress : Very much  Social Connections: Moderately Isolated (08/12/2022)   Social Connection and Isolation Panel [NHANES]    Frequency of Communication with Friends and Family: More than three times a week  Frequency of Social Gatherings with Friends and Family: More than three times a week    Attends Religious Services: Never    Database administrator or Organizations: No    Attends Banker Meetings: Never    Marital Status: Married  Catering manager Violence: Not At Risk (08/12/2022)   Humiliation, Afraid, Rape, and Kick questionnaire    Fear of Current or Ex-Partner: No    Emotionally Abused: No    Physically Abused: No    Sexually Abused: No    Review of Systems:  All other review of systems negative except as mentioned in the HPI.  Physical Exam: Vital signs BP 128/84   Pulse 97   Temp 97.8 F (36.6 C)   Ht 5\' 4"  (1.626 m)   Wt 135 lb (61.2 kg)   LMP 01/30/2023   SpO2 99%   BMI 23.17 kg/m   General:   Alert,  Well-developed, well-nourished, pleasant and cooperative in NAD Lungs:  Clear throughout to auscultation.   Heart:  Regular rate and rhythm; no murmurs, clicks, rubs,  or gallops. Abdomen:  Soft, nontender and nondistended. Normal bowel sounds.   Neuro/Psych:  Alert and cooperative. Normal mood and affect. A and O x 3   @Maira Christon  Sena Slate, MD, [facility] [phone] (pager) 02/04/2023 4:03 PM@

## 2023-02-04 NOTE — Progress Notes (Unsigned)
Called to room to assist during endoscopic procedure.  Patient ID and intended procedure confirmed with present staff. Received instructions for my participation in the procedure from the performing physician.  

## 2023-02-04 NOTE — Op Note (Signed)
[facility] Patient Name: Brianna Bowman Procedure Date: 02/04/2023 4:07 PM MRN: [id number] Endoscopist: Iva Boop , MD, [id number] Age: 52 Referring MD:  Date of Birth: Oct 26, 1970 Gender: Female Account #: [id number] Procedure:                Colonoscopy Indications:              Screening for colorectal malignant neoplasm, This                            is the patient's first colonoscopy Medicines:                Monitored Anesthesia Care Procedure:                Pre-Anesthesia Assessment:                           - Prior to the procedure, a History and Physical                            was performed, and patient medications and                            allergies were reviewed. The patient's tolerance of                            previous anesthesia was also reviewed. The risks                            and benefits of the procedure and the sedation                            options and risks were discussed with the patient.                            All questions were answered, and informed consent                            was obtained. Prior Anticoagulants: The patient has                            taken no anticoagulant or antiplatelet agents. ASA                            Grade Assessment: II - A patient with mild systemic                            disease. After reviewing the risks and benefits,                            the patient was deemed in satisfactory condition to                            undergo the procedure.  After obtaining informed consent, the colonoscope                            was passed under direct vision. Throughout the                            procedure, the patient's blood pressure, pulse, and                            oxygen saturations were monitored continuously. The                            Olympus Scope SN: [id number] was introduced through                            the anus and advanced  to the the cecum, identified                            by appendiceal orifice and ileocecal valve. The                            colonoscopy was performed without difficulty. The                            patient tolerated the procedure well. The quality                            of the bowel preparation was good. The ileocecal                            valve, appendiceal orifice, and rectum were                            photographed. Scope In: 4:19:34 PM Scope Out: 4:35:43 PM Scope Withdrawal Time: 0 hours 8 minutes 39 seconds  Total Procedure Duration: 0 hours 16 minutes 9 seconds  Findings:                 The perianal and digital rectal examinations were                            normal.                           Two sessile polyps were found in the sigmoid colon.                            The polyps were diminutive in size. These polyps                            were removed with a cold snare. Resection and                            retrieval were complete. Verification of patient  identification for the specimen was done. Estimated                            blood loss was minimal.                           The exam was otherwise without abnormality on                            direct and retroflexion views. Complications:            No immediate complications. Estimated Blood Loss:     Estimated blood loss was minimal. Impression:               - Two diminutive polyps in the sigmoid colon,                            removed with a cold snare. Resected and retrieved.                           - The examination was otherwise normal on direct                            and retroflexion views. Recommendation:           - Patient has a contact number available for                            emergencies. The signs and symptoms of potential                            delayed complications were discussed with the                            patient.  Return to normal activities tomorrow.                            Written discharge instructions were provided to the                            patient.                           - Resume previous diet.                           - Continue present medications.                           - Repeat colonoscopy is recommended. The                            colonoscopy date will be determined after pathology                            results from today's exam become available for  review. Iva Boop, MD 02/04/2023 4:53:06 PM This report has been signed electronically.

## 2023-02-04 NOTE — Progress Notes (Unsigned)
Vss nad trans to pacu 

## 2023-02-04 NOTE — Patient Instructions (Signed)

## 2023-02-04 NOTE — Progress Notes (Signed)
Pt's states no medical or surgical changes since previsit or office visit. 

## 2023-02-05 ENCOUNTER — Ambulatory Visit: Payer: 59 | Admitting: Podiatry

## 2023-02-05 ENCOUNTER — Ambulatory Visit: Payer: 59

## 2023-02-05 ENCOUNTER — Telehealth: Payer: Self-pay

## 2023-02-05 ENCOUNTER — Other Ambulatory Visit: Payer: Self-pay

## 2023-02-05 ENCOUNTER — Encounter: Payer: Self-pay | Admitting: Podiatry

## 2023-02-05 ENCOUNTER — Ambulatory Visit (INDEPENDENT_AMBULATORY_CARE_PROVIDER_SITE_OTHER): Payer: 59 | Admitting: Podiatry

## 2023-02-05 DIAGNOSIS — L600 Ingrowing nail: Secondary | ICD-10-CM | POA: Diagnosis not present

## 2023-02-05 DIAGNOSIS — M25671 Stiffness of right ankle, not elsewhere classified: Secondary | ICD-10-CM

## 2023-02-05 DIAGNOSIS — S82891A Other fracture of right lower leg, initial encounter for closed fracture: Secondary | ICD-10-CM

## 2023-02-05 DIAGNOSIS — R262 Difficulty in walking, not elsewhere classified: Secondary | ICD-10-CM

## 2023-02-05 NOTE — Progress Notes (Signed)
  Subjective:  Patient ID: Brianna Bowman, female    DOB: 1970-12-03,   MRN: [id number]  No chief complaint on file.   52 y.o. female presents for concern of ingrown nails on both feet. Relates this has been a chronic problem and will try and trim them and feel better but will return. Interested in what she can do. Also concerned about thickness in the nails.  She had an ankle fracture on the right in 2013 and recently one on the right and relates left ankle is hurting more recently. Currently using a walker . Denies any other pedal complaints. Denies n/v/f/c.   Past Medical History:  Diagnosis Date   Allergy    Anemia    with pregnancy   Anxiety    due to condition   Arthritis    Cataract    Depression    GERD (gastroesophageal reflux disease)    Headache(784.0)    history of   Hyperlipidemia    PONV (postoperative nausea and vomiting)    with epidural anad 04/28/13 BP low and took longer to wake after surgery 04/28/2012    Objective:  Physical Exam: Vascular: DP/PT pulses 2/4 bilateral. CFT <3 seconds. Normal hair growth on digits. No edema.  Skin. No lacerations or abrasions bilateral feet. Incurvation of medial border of right great toe and lateral border of left great toe with mild tenderness to palpation. No erythema edema or purulence noted. Mild thickening noted to lesser digit toenails.  Musculoskeletal: MMT 5/5 bilateral lower extremities in DF, PF, Inversion and Eversion. Deceased ROM in DF of ankle joint. No pain to palpation about the right ankle currently but some mild pain with ROM of the ankle joint.  Neurological: Sensation intact to light touch.   Assessment:   1. Ingrown right greater toenail   2. Ingrown left greater toenail      Plan:  Patient was evaluated and treated and all questions answered. Discussed ingrown toenails etiology and treatment options including procedure for removal vs conservative care.  Patient requesting removal of ingrown nail but  would like to schedule for another time when not recovering from her ankle fracture and out of the walker.  Discussed procedure in detail and patient will return for procedure at a more convenient time.  Did discuss dystrophic nails and OTC topicals that can be used.  Advised to continue with ankle brace on the right as she recovers on the left to protect from worsening possible arthritis.  Patient to return in future for ingrown nail procedure bilateral great toes.    Louann Sjogren, DPM

## 2023-02-05 NOTE — Therapy (Signed)
OUTPATIENT PHYSICAL THERAPY LOWER EXTREMITY TREATMENT   Patient Name: Brianna Bowman MRN: [id number] DOB:02/13/1971, 52 y.o., female Today's Date: 02/05/2023  END OF SESSION:  PT End of Session - 02/05/23 1714     Visit Number 9    Date for PT Re-Evaluation 02/24/23    Progress Note Due on Visit 10    PT Start Time 1617    PT Stop Time 1702    PT Time Calculation (min) 45 min    Activity Tolerance Patient tolerated treatment well    Behavior During Therapy WFL for tasks assessed/performed                     Past Medical History:  Diagnosis Date   Allergy    Anemia    with pregnancy   Anxiety    due to condition   Arthritis    Cataract    Depression    GERD (gastroesophageal reflux disease)    Headache(784.0)    history of   Hyperlipidemia    PONV (postoperative nausea and vomiting)    with epidural anad 04/28/13 BP low and took longer to wake after surgery 04/28/2012   Past Surgical History:  Procedure Laterality Date   Broken ankle Right 2024   FRACTURE SURGERY     L femur   HARDWARE REMOVAL Left 11/01/2012   Procedure: HARDWARE REMOVAL;  Surgeon: Eldred Manges, MD;  Location: [facility] OR;  Service: Orthopedics;  Laterality: Left;  Left Ankle Removal Fibula Plate and 10 screws, Irrigation and Debridement   MANDIBLE FRACTURE SURGERY     ORIF ANKLE FRACTURE  04/28/2012   Procedure: OPEN REDUCTION INTERNAL FIXATION (ORIF) ANKLE FRACTURE;  Surgeon: Eldred Manges, MD;  Location: [facility] OR;  Service: Orthopedics;  Laterality: Left;  Open reduction internal fixation left ankle bimalleolar fracture   Patient Active Problem List   Diagnosis Date Noted   Motion sickness 12/15/2022   White matter abnormality on MRI of brain 09/22/2022   Chronic migraine w/o aura, not intractable, w/o stat migr 09/22/2022   Opioid use 09/10/2022   Acute stress reaction 09/10/2022   Pink-colored urine 09/10/2022   Closed displaced trimalleolar fracture of lower leg, right, initial  encounter 08/23/2022   Allergic reaction 08/22/2022   Screening for diabetes mellitus 08/12/2022   Hyperlipidemia 08/12/2022   Elevated blood pressure reading 08/12/2022   Need for hepatitis C screening test 08/12/2022   Encounter for screening mammogram for malignant neoplasm of breast 08/12/2022   Colon cancer screening 08/12/2022   Blurry vision 08/12/2022   Ankle pain, chronic 08/12/2022   Need for diphtheria-tetanus-pertussis (Tdap) vaccine 08/12/2022   Chest pain 02/09/2018   Anxiety state 02/09/2018   History of migraine headaches 02/09/2018   Emphysema of lung (HCC) 02/09/2018   Neuralgia 06/12/2017   Tobacco abuse 11/29/2012   Bone infection of left ankle (HCC) 11/01/2012   Closed fracture of left ankle 04/28/2012    Class: Acute    PCP: Modena Slater, MD  REFERRING PROVIDER: Willia Craze, MD  REFERRING DIAG: R trimalleolar ankle fracture  THERAPY DIAG:  Difficulty in walking, not elsewhere classified  Closed fracture of right ankle, initial encounter  Stiffness of right ankle, not elsewhere classified  Rationale for Evaluation and Treatment: Rehabilitation  ONSET DATE: 08/23/2022  SUBJECTIVE:   SUBJECTIVE STATEMENT: 3 MD appts today, this one is her last, kind of tired, sore across top of R foot in "crease".  L foot hurts a lot, wearing ankle brace to  manage  PERTINENT HISTORY: Injured R ankle on slide, did not undergo surgical fixation, now 4 months post op PAIN:  Are you having pain? Yes: NPRS scale: 0/10 Pain location: b ankles  Pain description: L ache, R throbs and constantly hurts  Aggravating factors: weight bearing Relieving factors: sitting  PRECAUTIONS: Fall  WEIGHT BEARING RESTRICTIONS: Yes WBAT advance from boot to shoe gradually  FALLS:  Has patient fallen in last 6 months? Yes. Number of falls 2  LIVING ENVIRONMENT: Lives with: lives with their family Lives in: House/apartment Stairs: Yes: External: 1 .5 steps; on right going up Has  following equipment at home:  knee scooter, 3 wheeled walker  OCCUPATION: disabled  PLOF: Independent with basic ADLs  PATIENT GOALS: get R ankle to heal and be able to walk without device  NEXT MD VISIT: 5 weeks, Aug 2024  OBJECTIVE:   DIAGNOSTIC FINDINGS: XRs of the right ankle taken 12/26/2022 were independently reviewed and interpreted, showing a bimalleolar ankle fracture. No callus formation seen.  Talus is under the tibia but translated slightly laterally.  Displaced and laterally translated lateral malleolus.  There is a displaced medial malleolus fracture. Alignment appears similar to prior films. No new fractures seen.     PATIENT SURVEYS:  LEFS 14/80  COGNITION: Overall cognitive status: Within functional limits for tasks assessed     SENSATION: WFL  EDEMA:  Mild R lateral malleolus  POSTURE: weight shift left and hyperextends L knee, ER L hip and ankle  PALPATION: Non tender with palpation R ankle or achilles  LOWER EXTREMITY ROM:  Passive ROM Right eval Right 01/22/23  Hip flexion    Hip extension    Hip abduction    Hip adduction    Hip internal rotation    Hip external rotation    Knee flexion    Knee extension    Ankle dorsiflexion -15 -16*  Ankle plantarflexion 22 40*  Ankle inversion 5 15*  Ankle eversion 0 12*   (Blank rows = not tested)  LOWER EXTREMITY MMT: B hips, knee musculature grossly 4/5 L ankle NT R ankle plantarflexion 3-/5 DF 3-/5 INV 3-/5  01/22/23-  R DF 4 in available ROM, inversion/eversion 4+/5 in available ROM, R quads 4+/5, R hams 4-/5, L quad 4-/5, L hams 3+/5   FUNCTIONAL TESTS:  NA  GAIT: Distance walked: with FWW x 100' in clnic Assistive device utilized: Environmental consultant - 2 wheeled Level of assistance: SBA Comments: cues for step to pattern and sequencing leading with R LE  Patient also using knee scooter upon arrival and to maneuver throughout clnic   TODAY'S TREATMENT:                                                                                                                               DATE: 02/05/23:  Nustep L 4, 6 min Standing B feet on airex for toe raises Standing L foot on airex, R foot behind, for  forward lunges, pushing R foot into plantarflexion Side stepping along counter, 10' x r with red theraband around thighs Leg press, 15# 2 x 15 reps  Manual: Measured R ankle DF and assessed R ankle strength Long sitting for gentle gr 2 and 3 mobs mid foot, rear foot, fore foot, also for traction/distraction mid foot and rearfoot Mobilization with movement long sitting for talar glides post with ankle df stretch Mob with movement combined with lunges R foot on step with AP glides subtalar jt, 2 sets 15   Gait, with walker, encouraged to elongate L step length to achieve R foot dorsiflexion   02/02/23 Nustep L4x51min Gait around clinic 180 ft with rolling walker- cues for equal step length Fwd step on airex then weight shift x 10  Retro step x 15 with RW Gastroc stretch runner stretch and heel drop from book Supine clams blue TB 10x5" Supine SLR 2x10 bil  01/29/23 Recumbent Bike L7x48min 4 way ankle weight shifts x 10  Fwd step on airex then weight shift x 10  Retro step with RW x 10  Gastroc stretch 2x30 sec  Supine SLR x 12 bil Supine clamshell blue TB 10x3"  01/27/23:   Manual:  Ankle mobilizations subtalar jt, for dorsiflexion, gentle, to tolerance Side lying for myofascial release/cross friction mass medial R gastroc attachment along rib  Recumbent cycle 6 min level 7 Standing L foot on 1" block, R foot back for gastroc stretch/ lunges to tolerance mass practice  Standing L foot on 4" block, R foot planted, in walker, with light UE support and horizontal head turns B, pt with pain R subtalar jt so discontinued Heel/toe rocks B feet on airex in walker to tolerance, mass practice Seated leg press on fitter 2 bands for resistance , R LE 15 x 2 Seated hamstring curls green band, 15 x  2  sets   01/22/23  Objective measures, appropriate education, brief goal review  TherEx  Seated rockerboard x20 with 3-5 seconds holds in each position Seated rockerboard x20 inversion/eversion 3-5 second holds each position  Gastroc stretch 4x30 seconds  Standing wt shifts feet shoulder width apart x10  Heel-toe rocking x10 with L foot forward, x10 with R foot forward   Manual   Ankle dorsiflexion mobs grade III  Heel inversion/eversion grade III Forefoot mobility grade III     01/15/23 Seated toy yoga R x 10  Toe curls 2x10 BAPS fwd, circles x 10 bil PF with blue TB x 10  Knee extension with blue TB x 10 R Recumbent Bike L7x80min Fwd WS onto 4' step while sitting x 10  Heel slide RLE x 10   01/13/23: Measured dorsiflexion -8 today initially Seated for BAPS board R ankle cw, ccw  Seated picking up marbles R foot Toe yoga R Wash cloth scrunches R foot on floor Standing for side stepping along counter in crocs Standing weight shifting side to side, cues to maintain neutral hip position as she tends to flex R hip to accommodate for R ankle plantarflexion position.  Standing with B forefeet on 1/2" book for heel raises, holding counter to isolate B plantarflexor strength Seated resisted long arc quads with green band Recumbent bike. Level 5 for 5 min, then level 7 for 2 min  01/08/23 Dorsiflexion -15 initially , improved to -5 after stretching and therex Manual: brief stretching R ankle , mid foot, into dorsiflexion and metatarsal flexion   Therex: reviewed ex from initial session:    Seated ankle  pumps with green t band Standing R ankle dorsiflexion stretch, runner's stretch in standing, with theraband across R ant ankle to stabilize subtalar jt and isolate R ankle plantarflexors. Added sit to stand from elevated table with hip adductor ball squeeze, to further isolate quads musculature Seated long arc quads with 4#  10 x , therapist stopped due to crepitus R  patella Seated ball rolls with plantar foot on ball surface multiple directions, encouraged to engage intrinsics R foot /ankle  Recumbent bike level 6 x  10 min Instructed and practiced stair climbing, up and down one step with b UE support. In the boot.   12/30/22  Eval, inst in the exercises as described below.  Brief gait training with boot and clinic's front wheeled walker.  Provided with adjustable heel lift to use in boot for gradual R ankle dorsiflexion stretch, advised to remove wedges as tolerated.  Advised to obtain FWW for home, patient states she has a 3 wheeled walker but unsteady.  Can borrow FWW from family member  PATIENT EDUCATION:  Education details: POC, goals Person educated: Patient Education method: Explanation, Demonstration, Tactile cues, Verbal cues, and Handouts Education comprehension: verbalized understanding, returned demonstration, verbal cues required, tactile cues required, and needs further education  HOME EXERCISE PROGRAM: Access Code: [email or web address] URL: [email or web address] Date: 01/08/2023 Prepared by: Linton Rump Jevin Camino  Exercises - Seated Hip Adduction Squeeze with Ball  - 1 x daily - 7 x weekly - 3 sets - 10 reps - Sit to Stand Without Arm Support  - 1 x daily - 7 x weekly - 3 sets - 10 reps Access Code: [email or web address] URL: [email or web address] Date: 01/29/2023 Prepared by: Verta Ellen  Exercises - Seated Hip Adduction Squeeze with Ball  - 1 x daily - 7 x weekly - 3 sets - 10 reps - Sit to Stand Without Arm Support  - 1 x daily - 7 x weekly - 3 sets - 10 reps - Hooklying Clamshell with Resistance  - 1 x daily - 7 x weekly - 2-3 sets - 10 reps - Active Straight Leg Raise with Quad Set  - 1 x daily - 7 x weekly - 2-3 sets - 10 reps  ASSESSMENT:  CLINICAL IMPRESSION:  Continued progressing exercises to improve gait, strength and function, combined more manual techniques with the stretching particularly with dorsiflexion R ankle.   Improved pain and gait pattern after manual stretching. Still quite weak all over needs progression with B LE strength and stability. OBJECTIVE IMPAIRMENTS: decreased activity tolerance, decreased balance, decreased mobility, difficulty walking, decreased ROM, decreased strength, impaired perceived functional ability, impaired flexibility, postural dysfunction, and pain.   ACTIVITY LIMITATIONS: carrying, standing, squatting, stairs, transfers, bathing, toileting, locomotion level, and caring for others  PARTICIPATION LIMITATIONS: meal prep, cleaning, laundry, driving, shopping, community activity, and yard work  PERSONAL FACTORS: Fitness, Past/current experiences, Time since onset of injury/illness/exacerbation, Transportation, and 1-2 comorbidities: smoker,emphysema, s/o osteomyelitis L ankle  are also affecting patient's functional outcome.   REHAB POTENTIAL: Fair due to medical co morbidities  CLINICAL DECISION MAKING: Evolving/moderate complexity  EVALUATION COMPLEXITY: Moderate   GOALS: Goals reviewed with patient? Yes  SHORT TERM GOALS: Target date: 2 weeks, 01/13/23 I HEP Baseline: Goal status: MET 01/22/23  LONG TERM GOALS: Target date: 03/03/23  Gait speed greater than 1 m/sec with LAD over 800' for community distances Baseline: less than 0.5 m/sec Goal status: INITIAL  2.  LEFS Baseline: 14/80 Goal status: INITIAL  3.  Improve R ankle dorsiflexion ROM from -15  degrees to neutral for improved walking mechanics and transfer efficiency Baseline:  Goal status: ONGOING 01/22/23  4.  Strength B hips, knees 5/5 for improved endurance, activity tolerance, balance Baseline:4/5  Goal status: ONGOING 01/22/23  5.  Strength R ankle 3+5 all planes  Baseline: 3-/5 Goal status: INITIAL   PLAN: + PT FREQUENCY: 1-2x/week  PT DURATION: 8 weeks  PLANNED INTERVENTIONS: Therapeutic exercises, Therapeutic activity, Neuromuscular re-education, Balance training, Gait training,  Patient/Family education, Self Care, and Joint mobilization  PLAN FOR NEXT SESSION: progress with therex to engage, strengthen R ankle, progress with gait training.  Malani Lees Frazier Richards, PT , DPT, OCS 02/05/23 5:14 PM

## 2023-02-05 NOTE — Telephone Encounter (Signed)
Follow up call to pt, lm for pt to call if having any difficulty with normal activities or eating and drinking.  Also to call if any other questions or concerns.  

## 2023-02-06 ENCOUNTER — Encounter: Payer: Self-pay | Admitting: Urology

## 2023-02-06 ENCOUNTER — Ambulatory Visit (INDEPENDENT_AMBULATORY_CARE_PROVIDER_SITE_OTHER): Payer: 59 | Admitting: Urology

## 2023-02-06 VITALS — BP 127/86 | HR 82 | Ht 64.0 in | Wt 130.0 lb

## 2023-02-06 DIAGNOSIS — N3946 Mixed incontinence: Secondary | ICD-10-CM | POA: Diagnosis not present

## 2023-02-06 DIAGNOSIS — R8290 Unspecified abnormal findings in urine: Secondary | ICD-10-CM | POA: Diagnosis not present

## 2023-02-06 LAB — URINALYSIS, ROUTINE W REFLEX MICROSCOPIC
Bilirubin, UA: NEGATIVE
Glucose, UA: NEGATIVE
Ketones, UA: NEGATIVE
Nitrite, UA: POSITIVE — AB
Protein,UA: NEGATIVE
Specific Gravity, UA: 1.015 (ref 1.005–1.030)
Urobilinogen, Ur: 0.2 mg/dL (ref 0.2–1.0)
pH, UA: 6 (ref 5.0–7.5)

## 2023-02-06 LAB — MICROSCOPIC EXAMINATION

## 2023-02-06 LAB — BLADDER SCAN AMB NON-IMAGING

## 2023-02-06 MED ORDER — GEMTESA 75 MG PO TABS: 75.0000 mg | ORAL_TABLET | Freq: Every day | ORAL | Status: DC

## 2023-02-06 NOTE — Progress Notes (Signed)
Assessment: 1. Mixed incontinence   2. Abnormal urine findings     Plan: I personally reviewed the patient's chart including provider notes, and lab results. Diagnosis and management of stress and urge incontinence discussed with the patient today.  Management of stress incontinence with pelvic floor exercises, pelvic floor physical therapy, and surgical management discussed.  Options for management of urge incontinence including avoidance of dietary irritants, behavioral modification, medical therapy, neuromodulation, and chemodenervation discussed. Urine culture sent today Bladder diet sheet given.   Trial of Gemtesa 75 mg daily.  Samples provided Pelvic floor exercise instructions provided. Return to office in 1 month  Chief Complaint:  Chief Complaint  Patient presents with   Urinary Incontinence    History of Present Illness:  Brianna Bowman is a 52 y.o. female who is seen in consultation from Modena Slater, DO for evaluation of urinary incontinence.  She has a history of both stress and urge incontinence.  She reports daytime voids 5-6 times.  She has nocturia 4-5 times.  She has urgency primarily at night with associated urge incontinence.  She does report occasional stress incontinence with coughing, sneezing, and laughing.  She uses pads on occasion.  No dysuria or gross hematuria.  She has a history of UTIs with her last episode approximately 5 months ago.  Urine culture from 3/24:  >100 K . E coli.   Past Medical History:  Past Medical History:  Diagnosis Date   Allergy    Anemia    with pregnancy   Anxiety    due to condition   Arthritis    Cataract    Depression    GERD (gastroesophageal reflux disease)    Headache(784.0)    history of   Hyperlipidemia    PONV (postoperative nausea and vomiting)    with epidural anad 04/28/13 BP low and took longer to wake after surgery 04/28/2012    Past Surgical History:  Past Surgical History:  Procedure Laterality  Date   Broken ankle Right 2024   FRACTURE SURGERY     L femur   HARDWARE REMOVAL Left 11/01/2012   Procedure: HARDWARE REMOVAL;  Surgeon: Eldred Manges, MD;  Location: [facility] OR;  Service: Orthopedics;  Laterality: Left;  Left Ankle Removal Fibula Plate and 10 screws, Irrigation and Debridement   MANDIBLE FRACTURE SURGERY     ORIF ANKLE FRACTURE  04/28/2012   Procedure: OPEN REDUCTION INTERNAL FIXATION (ORIF) ANKLE FRACTURE;  Surgeon: Eldred Manges, MD;  Location: [facility] OR;  Service: Orthopedics;  Laterality: Left;  Open reduction internal fixation left ankle bimalleolar fracture    Allergies:  Allergies  Allergen Reactions   Amoxicillin Anaphylaxis    Has patient had a PCN reaction causing immediate rash, facial/tongue/throat swelling, SOB or lightheadedness with hypotension: Yes Has patient had a PCN reaction causing severe rash involving mucus membranes or skin necrosis: No Has patient had a PCN reaction that required hospitalization: No Has patient had a PCN reaction occurring within the last 10 years: Yes If all of the above answers are "NO", then may proceed with Cephalosporin use.    Adhesive [Tape] Hives   Aloe Hives   Rizatriptan     Heart palpitations, "jaw line felt like it was seizing up", weak, uneasy.    Shellfish Allergy Rash    Lobster    Family History:  Family History  Problem Relation Age of Onset   Cancer Mother    Multiple sclerosis Father    Pneumonia Father  Heart attack Maternal Grandfather        Died in his 67s   Colon cancer Paternal Grandfather    Colon polyps Neg Hx    Esophageal cancer Neg Hx    Rectal cancer Neg Hx    Stomach cancer Neg Hx     Social History:  Social History   Tobacco Use   Smoking status: Every Day    Current packs/day: 1.00    Average packs/day: 1 pack/day for 20.0 years (20.0 ttl pk-yrs)    Types: Cigarettes   Smokeless tobacco: Never  Substance Use Topics   Alcohol use: Yes    Comment: Occasional   Drug use: Never     Review of symptoms:  Constitutional:  Negative for unexplained weight loss, night sweats, fever, chills ENT:  Negative for nose bleeds, sinus pain, painful swallowing CV:  Negative for chest pain, shortness of breath, exercise intolerance, palpitations, loss of consciousness Resp:  Negative for cough, wheezing, shortness of breath GI:  Negative for nausea, vomiting, diarrhea, bloody stools GU:  Positives noted in HPI; otherwise negative for gross hematuria, dysuria Neuro:  Negative for seizures, poor balance, limb weakness, slurred speech Psych:  Negative for lack of energy, depression, anxiety Endocrine:  Negative for polydipsia, polyuria, symptoms of hypoglycemia (dizziness, hunger, sweating) Hematologic:  Negative for anemia, purpura, petechia, prolonged or excessive bleeding, use of anticoagulants  Allergic:  Negative for difficulty breathing or choking as a result of exposure to anything; no shellfish allergy; no allergic response (rash/itch) to materials, foods  Physical exam: BP 127/86   Pulse 82   Ht 5\' 4"  (1.626 m)   Wt 130 lb (59 kg)   LMP 01/30/2023   BMI 22.31 kg/m  GENERAL APPEARANCE:  Well appearing, well developed, well nourished, NAD HEENT: Atraumatic, Normocephalic, oropharynx clear. NECK: Supple without lymphadenopathy or thyromegaly. LUNGS: Clear to auscultation bilaterally. HEART: Regular Rate and Rhythm without murmurs, gallops, or rubs. ABDOMEN: Soft, non-tender, No Masses. EXTREMITIES: Moves all extremities well.  Without clubbing, cyanosis, or edema. NEUROLOGIC:  Alert and oriented x 3, normal gait, CN II-XII grossly intact.  MENTAL STATUS:  Appropriate. BACK:  Non-tender to palpation.  No CVAT SKIN:  Warm, dry and intact.    Results: U/A: 6-10 WBC, 3-10 RBC, many bacteria, nitrite +  PVR = 34 ml

## 2023-02-09 ENCOUNTER — Ambulatory Visit: Payer: 59

## 2023-02-09 ENCOUNTER — Encounter: Payer: Self-pay | Admitting: Internal Medicine

## 2023-02-09 LAB — URINE CULTURE

## 2023-02-10 ENCOUNTER — Telehealth: Payer: Self-pay | Admitting: Urology

## 2023-02-10 NOTE — Telephone Encounter (Signed)
Saw lab results in mychart. Would like to know what they mean

## 2023-02-11 ENCOUNTER — Other Ambulatory Visit: Payer: 59

## 2023-02-12 ENCOUNTER — Ambulatory Visit: Payer: 59

## 2023-02-12 DIAGNOSIS — R262 Difficulty in walking, not elsewhere classified: Secondary | ICD-10-CM

## 2023-02-12 DIAGNOSIS — S82891A Other fracture of right lower leg, initial encounter for closed fracture: Secondary | ICD-10-CM

## 2023-02-12 DIAGNOSIS — M25671 Stiffness of right ankle, not elsewhere classified: Secondary | ICD-10-CM

## 2023-02-12 NOTE — Therapy (Signed)
OUTPATIENT PHYSICAL THERAPY LOWER EXTREMITY TREATMENT   Patient Name: Brianna Bowman MRN: [id number] DOB:Apr 28, 1971, 52 y.o., female Today's Date: 02/12/2023  END OF SESSION:  PT End of Session - 02/12/23 1705     Visit Number 10    Date for PT Re-Evaluation 02/24/23    Progress Note Due on Visit 10    PT Start Time 1618    PT Stop Time 1700    PT Time Calculation (min) 42 min    Activity Tolerance Patient tolerated treatment well    Behavior During Therapy WFL for tasks assessed/performed                      Past Medical History:  Diagnosis Date   Allergy    Anemia    with pregnancy   Anxiety    due to condition   Arthritis    Cataract    Depression    GERD (gastroesophageal reflux disease)    Headache(784.0)    history of   Hyperlipidemia    PONV (postoperative nausea and vomiting)    with epidural anad 04/28/13 BP low and took longer to wake after surgery 04/28/2012   Past Surgical History:  Procedure Laterality Date   Broken ankle Right 2024   FRACTURE SURGERY     L femur   HARDWARE REMOVAL Left 11/01/2012   Procedure: HARDWARE REMOVAL;  Surgeon: Eldred Manges, MD;  Location: [facility] OR;  Service: Orthopedics;  Laterality: Left;  Left Ankle Removal Fibula Plate and 10 screws, Irrigation and Debridement   MANDIBLE FRACTURE SURGERY     ORIF ANKLE FRACTURE  04/28/2012   Procedure: OPEN REDUCTION INTERNAL FIXATION (ORIF) ANKLE FRACTURE;  Surgeon: Eldred Manges, MD;  Location: [facility] OR;  Service: Orthopedics;  Laterality: Left;  Open reduction internal fixation left ankle bimalleolar fracture   Patient Active Problem List   Diagnosis Date Noted   Motion sickness 12/15/2022   White matter abnormality on MRI of brain 09/22/2022   Chronic migraine w/o aura, not intractable, w/o stat migr 09/22/2022   Opioid use 09/10/2022   Acute stress reaction 09/10/2022   Pink-colored urine 09/10/2022   Closed displaced trimalleolar fracture of lower leg, right, initial  encounter 08/23/2022   Allergic reaction 08/22/2022   Screening for diabetes mellitus 08/12/2022   Hyperlipidemia 08/12/2022   Elevated blood pressure reading 08/12/2022   Need for hepatitis C screening test 08/12/2022   Encounter for screening mammogram for malignant neoplasm of breast 08/12/2022   Colon cancer screening 08/12/2022   Blurry vision 08/12/2022   Ankle pain, chronic 08/12/2022   Need for diphtheria-tetanus-pertussis (Tdap) vaccine 08/12/2022   Chest pain 02/09/2018   Anxiety state 02/09/2018   History of migraine headaches 02/09/2018   Emphysema of lung (HCC) 02/09/2018   Neuralgia 06/12/2017   Tobacco abuse 11/29/2012   Bone infection of left ankle (HCC) 11/01/2012   Closed fracture of left ankle 04/28/2012    Class: Acute    PCP: Modena Slater, MD  REFERRING PROVIDER: Willia Craze, MD  REFERRING DIAG: R trimalleolar ankle fracture  THERAPY DIAG:  Difficulty in walking, not elsewhere classified  Closed fracture of right ankle, initial encounter  Stiffness of right ankle, not elsewhere classified  Rationale for Evaluation and Treatment: Rehabilitation  ONSET DATE: 08/23/2022  SUBJECTIVE:   SUBJECTIVE STATEMENT: More pain inm L ankle than R. Over compensating with R ankle.  PERTINENT HISTORY: Injured R ankle on slide, did not undergo surgical fixation, now 4 months post  op PAIN:  Are you having pain? Yes: NPRS scale: 4, L ankle, 5 R ankle/10 Pain location: b ankles  Pain description: L ache, R throbs and constantly hurts  Aggravating factors: weight bearing Relieving factors: sitting  PRECAUTIONS: Fall  WEIGHT BEARING RESTRICTIONS: Yes WBAT advance from boot to shoe gradually  FALLS:  Has patient fallen in last 6 months? Yes. Number of falls 2  LIVING ENVIRONMENT: Lives with: lives with their family Lives in: House/apartment Stairs: Yes: External: 1 .5 steps; on right going up Has following equipment at home:  knee scooter, 3 wheeled  walker  OCCUPATION: disabled  PLOF: Independent with basic ADLs  PATIENT GOALS: get R ankle to heal and be able to walk without device  NEXT MD VISIT: 5 weeks, Aug 2024  OBJECTIVE:   DIAGNOSTIC FINDINGS: XRs of the right ankle taken 12/26/2022 were independently reviewed and interpreted, showing a bimalleolar ankle fracture. No callus formation seen.  Talus is under the tibia but translated slightly laterally.  Displaced and laterally translated lateral malleolus.  There is a displaced medial malleolus fracture. Alignment appears similar to prior films. No new fractures seen.     PATIENT SURVEYS:  LEFS 14/80  COGNITION: Overall cognitive status: Within functional limits for tasks assessed     SENSATION: WFL  EDEMA:  Mild R lateral malleolus  POSTURE: weight shift left and hyperextends L knee, ER L hip and ankle  PALPATION: Non tender with palpation R ankle or achilles  LOWER EXTREMITY ROM:  Passive ROM Right eval Right 01/22/23  Hip flexion    Hip extension    Hip abduction    Hip adduction    Hip internal rotation    Hip external rotation    Knee flexion    Knee extension    Ankle dorsiflexion -15 -16*  Ankle plantarflexion 22 40*  Ankle inversion 5 15*  Ankle eversion 0 12*   (Blank rows = not tested)  LOWER EXTREMITY MMT: B hips, knee musculature grossly 4/5 L ankle NT R ankle plantarflexion 3-/5 DF 3-/5 INV 3-/5  01/22/23-  R DF 4 in available ROM, inversion/eversion 4+/5 in available ROM, R quads 4+/5, R hams 4-/5, L quad 4-/5, L hams 3+/5 02/12/23- hips globally 4+/5, except Bil hip extension; B knees 4+/5   FUNCTIONAL TESTS:  NA  GAIT: Distance walked: with FWW x 100' in clnic Assistive device utilized: Environmental consultant - 2 wheeled Level of assistance: SBA Comments: cues for step to pattern and sequencing leading with R LE  Patient also using knee scooter upon arrival and to maneuver throughout clnic   TODAY'S TREATMENT:                                                                                                                               DATE: 02/12/23 Bike L8x42min Assessed LE strength Heel/toe raise on airex x 10 eac Step lunge for ankle dorsiflexion x 10  Seated ankle dorsiflexion GTB 2x10  B  Manual: Ankle mobilizations for DF B  PROM into dorsiflexion  02/05/23:  Nustep L 4, 6 min Standing B feet on airex for toe raises Standing L foot on airex, R foot behind, for forward lunges, pushing R foot into plantarflexion Side stepping along counter, 10' x r with red theraband around thighs Leg press, 15# 2 x 15 reps  Manual: Measured R ankle DF and assessed R ankle strength Long sitting for gentle gr 2 and 3 mobs mid foot, rear foot, fore foot, also for traction/distraction mid foot and rearfoot Mobilization with movement long sitting for talar glides post with ankle df stretch Mob with movement combined with lunges R foot on step with AP glides subtalar jt, 2 sets 15   Gait, with walker, encouraged to elongate L step length to achieve R foot dorsiflexion   02/02/23 Nustep L4x59min Gait around clinic 180 ft with rolling walker- cues for equal step length Fwd step on airex then weight shift x 10  Retro step x 15 with RW Gastroc stretch runner stretch and heel drop from book Supine clams blue TB 10x5" Supine SLR 2x10 bil  01/29/23 Recumbent Bike L7x46min 4 way ankle weight shifts x 10  Fwd step on airex then weight shift x 10  Retro step with RW x 10  Gastroc stretch 2x30 sec  Supine SLR x 12 bil Supine clamshell blue TB 10x3"  01/27/23:   Manual:  Ankle mobilizations subtalar jt, for dorsiflexion, gentle, to tolerance Side lying for myofascial release/cross friction mass medial R gastroc attachment along rib  Recumbent cycle 6 min level 7 Standing L foot on 1" block, R foot back for gastroc stretch/ lunges to tolerance mass practice  Standing L foot on 4" block, R foot planted, in walker, with light UE support and  horizontal head turns B, pt with pain R subtalar jt so discontinued Heel/toe rocks B feet on airex in walker to tolerance, mass practice Seated leg press on fitter 2 bands for resistance , R LE 15 x 2 Seated hamstring curls green band, 15 x  2 sets   01/22/23  Objective measures, appropriate education, brief goal review  TherEx  Seated rockerboard x20 with 3-5 seconds holds in each position Seated rockerboard x20 inversion/eversion 3-5 second holds each position  Gastroc stretch 4x30 seconds  Standing wt shifts feet shoulder width apart x10  Heel-toe rocking x10 with L foot forward, x10 with R foot forward   Manual   Ankle dorsiflexion mobs grade III  Heel inversion/eversion grade III Forefoot mobility grade III     01/15/23 Seated toy yoga R x 10  Toe curls 2x10 BAPS fwd, circles x 10 bil PF with blue TB x 10  Knee extension with blue TB x 10 R Recumbent Bike L7x8min Fwd WS onto 4' step while sitting x 10  Heel slide RLE x 10   01/13/23: Measured dorsiflexion -8 today initially Seated for BAPS board R ankle cw, ccw  Seated picking up marbles R foot Toe yoga R Wash cloth scrunches R foot on floor Standing for side stepping along counter in crocs Standing weight shifting side to side, cues to maintain neutral hip position as she tends to flex R hip to accommodate for R ankle plantarflexion position.  Standing with B forefeet on 1/2" book for heel raises, holding counter to isolate B plantarflexor strength Seated resisted long arc quads with green band Recumbent bike. Level 5 for 5 min, then level 7 for 2 min  01/08/23  Dorsiflexion -15 initially , improved to -5 after stretching and therex Manual: brief stretching R ankle , mid foot, into dorsiflexion and metatarsal flexion   Therex: reviewed ex from initial session:    Seated ankle pumps with green t band Standing R ankle dorsiflexion stretch, runner's stretch in standing, with theraband across R ant ankle to  stabilize subtalar jt and isolate R ankle plantarflexors. Added sit to stand from elevated table with hip adductor ball squeeze, to further isolate quads musculature Seated long arc quads with 4#  10 x , therapist stopped due to crepitus R patella Seated ball rolls with plantar foot on ball surface multiple directions, encouraged to engage intrinsics R foot /ankle  Recumbent bike level 6 x  10 min Instructed and practiced stair climbing, up and down one step with b UE support. In the boot.   12/30/22  Eval, inst in the exercises as described below.  Brief gait training with boot and clinic's front wheeled walker.  Provided with adjustable heel lift to use in boot for gradual R ankle dorsiflexion stretch, advised to remove wedges as tolerated.  Advised to obtain FWW for home, patient states she has a 3 wheeled walker but unsteady.  Can borrow FWW from family member  PATIENT EDUCATION:  Education details: POC, goals Person educated: Patient Education method: Explanation, Demonstration, Tactile cues, Verbal cues, and Handouts Education comprehension: verbalized understanding, returned demonstration, verbal cues required, tactile cues required, and needs further education  HOME EXERCISE PROGRAM: Access Code: [email or web address] URL: [email or web address] Date: 01/08/2023 Prepared by: Linton Rump Speaks  Exercises - Seated Hip Adduction Squeeze with Ball  - 1 x daily - 7 x weekly - 3 sets - 10 reps - Sit to Stand Without Arm Support  - 1 x daily - 7 x weekly - 3 sets - 10 reps Access Code: [email or web address] URL: [email or web address] Date: 01/29/2023 Prepared by: Verta Ellen  Exercises - Seated Hip Adduction Squeeze with Ball  - 1 x daily - 7 x weekly - 3 sets - 10 reps - Sit to Stand Without Arm Support  - 1 x daily - 7 x weekly - 3 sets - 10 reps - Hooklying Clamshell with Resistance  - 1 x daily - 7 x weekly - 2-3 sets - 10 reps - Active Straight Leg Raise with Quad Set  - 1 x daily  - 7 x weekly - 2-3 sets - 10 reps  ASSESSMENT:  CLINICAL IMPRESSION:  Pt showed a good response to treatment. She did have some pain with attempting step ups onto airex. With standing ankle DF mobilization she noted limitation with movement. Addressed this with ankle mobilizations which helped to reduce the joint stiffness. Also educated her on doing her exercises with L ankle as well as R. She would continue to benefit from [facility] exercise and joint mobility exercises to improve function. OBJECTIVE IMPAIRMENTS: decreased activity tolerance, decreased balance, decreased mobility, difficulty walking, decreased ROM, decreased strength, impaired perceived functional ability, impaired flexibility, postural dysfunction, and pain.   ACTIVITY LIMITATIONS: carrying, standing, squatting, stairs, transfers, bathing, toileting, locomotion level, and caring for others  PARTICIPATION LIMITATIONS: meal prep, cleaning, laundry, driving, shopping, community activity, and yard work  PERSONAL FACTORS: Fitness, Past/current experiences, Time since onset of injury/illness/exacerbation, Transportation, and 1-2 comorbidities: smoker,emphysema, s/o osteomyelitis L ankle  are also affecting patient's functional outcome.   REHAB POTENTIAL: Fair due to medical co morbidities  CLINICAL DECISION MAKING: Evolving/moderate complexity  EVALUATION COMPLEXITY: Moderate   GOALS: Goals reviewed with patient? Yes  SHORT TERM GOALS: Target date: 2 weeks, 01/13/23 I HEP Baseline: Goal status: MET 01/22/23  LONG TERM GOALS: Target date: 03/03/23  Gait speed greater than 1 m/sec with LAD over 800' for community distances Baseline: less than 0.5 m/sec Goal status: INITIAL  2.  LEFS Baseline: 14/80 Goal status: INITIAL  3.  Improve R ankle dorsiflexion ROM from -15 degrees to neutral for improved walking mechanics and transfer efficiency Baseline:  Goal status: ONGOING 01/22/23  4.  Strength B hips, knees 5/5 for improved  endurance, activity tolerance, balance Baseline:4/5  Goal status: ONGOING 02/12/23  5.  Strength R ankle 3+5 all planes  Baseline: 3-/5 Goal status: INITIAL   PLAN: + PT FREQUENCY: 1-2x/week  PT DURATION: 8 weeks  PLANNED INTERVENTIONS: Therapeutic exercises, Therapeutic activity, Neuromuscular re-education, Balance training, Gait training, Patient/Family education, Self Care, and Joint mobilization  PLAN FOR NEXT SESSION: progress with therex to engage, strengthen R ankle, progress with gait training.  Darleene Cleaver, PTA  02/12/23 5:10 PM

## 2023-02-16 ENCOUNTER — Other Ambulatory Visit: Payer: Self-pay | Admitting: Urology

## 2023-02-16 ENCOUNTER — Ambulatory Visit: Payer: 59

## 2023-02-16 ENCOUNTER — Encounter: Payer: Self-pay | Admitting: Urology

## 2023-02-16 ENCOUNTER — Telehealth: Payer: Self-pay

## 2023-02-16 DIAGNOSIS — S82891A Other fracture of right lower leg, initial encounter for closed fracture: Secondary | ICD-10-CM

## 2023-02-16 DIAGNOSIS — M25671 Stiffness of right ankle, not elsewhere classified: Secondary | ICD-10-CM

## 2023-02-16 DIAGNOSIS — R262 Difficulty in walking, not elsewhere classified: Secondary | ICD-10-CM

## 2023-02-16 DIAGNOSIS — R8290 Unspecified abnormal findings in urine: Secondary | ICD-10-CM

## 2023-02-16 MED ORDER — CIPROFLOXACIN HCL 500 MG PO TABS
500.0000 mg | ORAL_TABLET | Freq: Two times a day (BID) | ORAL | 0 refills | Status: AC
Start: 2023-02-16 — End: 2023-02-21

## 2023-02-16 NOTE — Telephone Encounter (Signed)
-----   Message from Di Kindle sent at 02/16/2023  8:59 AM EDT ----- Please notify the patient that her urine culture did show evidence of a UTI.   Rx for Cipro x 5 days sent to pharmacy for treatment.

## 2023-02-16 NOTE — Telephone Encounter (Signed)
Pt aware of the results and the need for medications to treat the UTI. Pt verbalized understanding and appreciation.

## 2023-02-16 NOTE — Therapy (Signed)
OUTPATIENT PHYSICAL THERAPY LOWER EXTREMITY TREATMENT   Patient Name: Brianna Bowman MRN: [id number] DOB:Jun 04, 1971, 52 y.o., female Today's Date: 02/16/2023  END OF SESSION:  PT End of Session - 02/16/23 1631     Visit Number 11    Date for PT Re-Evaluation 02/24/23    Progress Note Due on Visit 10    PT Start Time 1621    PT Stop Time 1708    PT Time Calculation (min) 47 min    Activity Tolerance Patient tolerated treatment well    Behavior During Therapy WFL for tasks assessed/performed                      Past Medical History:  Diagnosis Date   Allergy    Anemia    with pregnancy   Anxiety    due to condition   Arthritis    Cataract    Depression    GERD (gastroesophageal reflux disease)    Headache(784.0)    history of   Hyperlipidemia    PONV (postoperative nausea and vomiting)    with epidural anad 04/28/13 BP low and took longer to wake after surgery 04/28/2012   Past Surgical History:  Procedure Laterality Date   Broken ankle Right 2024   FRACTURE SURGERY     L femur   HARDWARE REMOVAL Left 11/01/2012   Procedure: HARDWARE REMOVAL;  Surgeon: Eldred Manges, MD;  Location: [facility] OR;  Service: Orthopedics;  Laterality: Left;  Left Ankle Removal Fibula Plate and 10 screws, Irrigation and Debridement   MANDIBLE FRACTURE SURGERY     ORIF ANKLE FRACTURE  04/28/2012   Procedure: OPEN REDUCTION INTERNAL FIXATION (ORIF) ANKLE FRACTURE;  Surgeon: Eldred Manges, MD;  Location: [facility] OR;  Service: Orthopedics;  Laterality: Left;  Open reduction internal fixation left ankle bimalleolar fracture   Patient Active Problem List   Diagnosis Date Noted   Motion sickness 12/15/2022   White matter abnormality on MRI of brain 09/22/2022   Chronic migraine w/o aura, not intractable, w/o stat migr 09/22/2022   Opioid use 09/10/2022   Acute stress reaction 09/10/2022   Pink-colored urine 09/10/2022   Closed displaced trimalleolar fracture of lower leg, right, initial  encounter 08/23/2022   Allergic reaction 08/22/2022   Screening for diabetes mellitus 08/12/2022   Hyperlipidemia 08/12/2022   Elevated blood pressure reading 08/12/2022   Need for hepatitis C screening test 08/12/2022   Encounter for screening mammogram for malignant neoplasm of breast 08/12/2022   Colon cancer screening 08/12/2022   Blurry vision 08/12/2022   Ankle pain, chronic 08/12/2022   Need for diphtheria-tetanus-pertussis (Tdap) vaccine 08/12/2022   Chest pain 02/09/2018   Anxiety state 02/09/2018   History of migraine headaches 02/09/2018   Emphysema of lung (HCC) 02/09/2018   Neuralgia 06/12/2017   Tobacco abuse 11/29/2012   Bone infection of left ankle (HCC) 11/01/2012   Closed fracture of left ankle 04/28/2012    Class: Acute    PCP: Modena Slater, MD  REFERRING PROVIDER: Willia Craze, MD  REFERRING DIAG: R trimalleolar ankle fracture  THERAPY DIAG:  Difficulty in walking, not elsewhere classified  Closed fracture of right ankle, initial encounter  Stiffness of right ankle, not elsewhere classified  Rationale for Evaluation and Treatment: Rehabilitation  ONSET DATE: 08/23/2022  SUBJECTIVE:   SUBJECTIVE STATEMENT: Less pain today.  PERTINENT HISTORY: Injured R ankle on slide, did not undergo surgical fixation, now 4 months post op PAIN:  Are you having pain? Yes: NPRS  scale: 4, L ankle, 5 R ankle/10 Pain location: b ankles  Pain description: L ache, R throbs and constantly hurts  Aggravating factors: weight bearing Relieving factors: sitting  PRECAUTIONS: Fall  WEIGHT BEARING RESTRICTIONS: Yes WBAT advance from boot to shoe gradually  FALLS:  Has patient fallen in last 6 months? Yes. Number of falls 2  LIVING ENVIRONMENT: Lives with: lives with their family Lives in: House/apartment Stairs: Yes: External: 1 .5 steps; on right going up Has following equipment at home:  knee scooter, 3 wheeled walker  OCCUPATION: disabled  PLOF: Independent  with basic ADLs  PATIENT GOALS: get R ankle to heal and be able to walk without device  NEXT MD VISIT: 02/26/23  OBJECTIVE:   DIAGNOSTIC FINDINGS: XRs of the right ankle taken 12/26/2022 were independently reviewed and interpreted, showing a bimalleolar ankle fracture. No callus formation seen.  Talus is under the tibia but translated slightly laterally.  Displaced and laterally translated lateral malleolus.  There is a displaced medial malleolus fracture. Alignment appears similar to prior films. No new fractures seen.     PATIENT SURVEYS:  LEFS 14/80  COGNITION: Overall cognitive status: Within functional limits for tasks assessed     SENSATION: WFL  EDEMA:  Mild R lateral malleolus  POSTURE: weight shift left and hyperextends L knee, ER L hip and ankle  PALPATION: Non tender with palpation R ankle or achilles  LOWER EXTREMITY ROM:  Passive ROM Right eval Right 01/22/23  Hip flexion    Hip extension    Hip abduction    Hip adduction    Hip internal rotation    Hip external rotation    Knee flexion    Knee extension    Ankle dorsiflexion -15 -16*  Ankle plantarflexion 22 40*  Ankle inversion 5 15*  Ankle eversion 0 12*   (Blank rows = not tested)  LOWER EXTREMITY MMT: B hips, knee musculature grossly 4/5 L ankle NT R ankle plantarflexion 3-/5 DF 3-/5 INV 3-/5  01/22/23-  R DF 4 in available ROM, inversion/eversion 4+/5 in available ROM, R quads 4+/5, R hams 4-/5, L quad 4-/5, L hams 3+/5 02/12/23- hips globally 4+/5, except Bil hip extension; B knees 4+/5   FUNCTIONAL TESTS:  NA  GAIT: Distance walked: with FWW x 100' in clnic Assistive device utilized: Environmental consultant - 2 wheeled Level of assistance: SBA Comments: cues for step to pattern and sequencing leading with R LE  Patient also using knee scooter upon arrival and to maneuver throughout clnic   TODAY'S TREATMENT:                                                                                                                               DATE: 02/16/23 Nustep L5x22min LE only Standing marching, hip abduction, hip extension  x 10 B Bridge 2x10 Supine hip ADD with ball 2x10 5 sec hold Supine SLR 2# x 10 bil S/L hip abduction 2# x 10  bil - cues to keep LE nuetral Prone hip extension 2x5 B 2# Ankle DF measured: 15 deg R 02/12/23 Bike L8x60min Assessed LE strength Heel/toe raise on airex x 10 eac Step lunge for ankle dorsiflexion x 10  Seated ankle dorsiflexion GTB 2x10 B  Manual: Ankle mobilizations for DF B  PROM into dorsiflexion  02/05/23:  Nustep L 4, 6 min Standing B feet on airex for toe raises Standing L foot on airex, R foot behind, for forward lunges, pushing R foot into plantarflexion Side stepping along counter, 10' x r with red theraband around thighs Leg press, 15# 2 x 15 reps  Manual: Measured R ankle DF and assessed R ankle strength Long sitting for gentle gr 2 and 3 mobs mid foot, rear foot, fore foot, also for traction/distraction mid foot and rearfoot Mobilization with movement long sitting for talar glides post with ankle df stretch Mob with movement combined with lunges R foot on step with AP glides subtalar jt, 2 sets 15   Gait, with walker, encouraged to elongate L step length to achieve R foot dorsiflexion   02/02/23 Nustep L4x15min Gait around clinic 180 ft with rolling walker- cues for equal step length Fwd step on airex then weight shift x 10  Retro step x 15 with RW Gastroc stretch runner stretch and heel drop from book Supine clams blue TB 10x5" Supine SLR 2x10 bil  01/29/23 Recumbent Bike L7x50min 4 way ankle weight shifts x 10  Fwd step on airex then weight shift x 10  Retro step with RW x 10  Gastroc stretch 2x30 sec  Supine SLR x 12 bil Supine clamshell blue TB 10x3"  01/27/23:   Manual:  Ankle mobilizations subtalar jt, for dorsiflexion, gentle, to tolerance Side lying for myofascial release/cross friction mass medial R gastroc attachment along  rib  Recumbent cycle 6 min level 7 Standing L foot on 1" block, R foot back for gastroc stretch/ lunges to tolerance mass practice  Standing L foot on 4" block, R foot planted, in walker, with light UE support and horizontal head turns B, pt with pain R subtalar jt so discontinued Heel/toe rocks B feet on airex in walker to tolerance, mass practice Seated leg press on fitter 2 bands for resistance , R LE 15 x 2 Seated hamstring curls green band, 15 x  2 sets   01/22/23  Objective measures, appropriate education, brief goal review  TherEx  Seated rockerboard x20 with 3-5 seconds holds in each position Seated rockerboard x20 inversion/eversion 3-5 second holds each position  Gastroc stretch 4x30 seconds  Standing wt shifts feet shoulder width apart x10  Heel-toe rocking x10 with L foot forward, x10 with R foot forward   Manual   Ankle dorsiflexion mobs grade III  Heel inversion/eversion grade III Forefoot mobility grade III     01/15/23 Seated toy yoga R x 10  Toe curls 2x10 BAPS fwd, circles x 10 bil PF with blue TB x 10  Knee extension with blue TB x 10 R Recumbent Bike L7x79min Fwd WS onto 4' step while sitting x 10  Heel slide RLE x 10   01/13/23: Measured dorsiflexion -8 today initially Seated for BAPS board R ankle cw, ccw  Seated picking up marbles R foot Toe yoga R Wash cloth scrunches R foot on floor Standing for side stepping along counter in crocs Standing weight shifting side to side, cues to maintain neutral hip position as she tends to flex R hip to accommodate for  R ankle plantarflexion position.  Standing with B forefeet on 1/2" book for heel raises, holding counter to isolate B plantarflexor strength Seated resisted long arc quads with green band Recumbent bike. Level 5 for 5 min, then level 7 for 2 min  01/08/23 Dorsiflexion -15 initially , improved to -5 after stretching and therex Manual: brief stretching R ankle , mid foot, into dorsiflexion and  metatarsal flexion   Therex: reviewed ex from initial session:    Seated ankle pumps with green t band Standing R ankle dorsiflexion stretch, runner's stretch in standing, with theraband across R ant ankle to stabilize subtalar jt and isolate R ankle plantarflexors. Added sit to stand from elevated table with hip adductor ball squeeze, to further isolate quads musculature Seated long arc quads with 4#  10 x , therapist stopped due to crepitus R patella Seated ball rolls with plantar foot on ball surface multiple directions, encouraged to engage intrinsics R foot /ankle  Recumbent bike level 6 x  10 min Instructed and practiced stair climbing, up and down one step with b UE support. In the boot.   12/30/22  Eval, inst in the exercises as described below.  Brief gait training with boot and clinic's front wheeled walker.  Provided with adjustable heel lift to use in boot for gradual R ankle dorsiflexion stretch, advised to remove wedges as tolerated.  Advised to obtain FWW for home, patient states she has a 3 wheeled walker but unsteady.  Can borrow FWW from family member  PATIENT EDUCATION:  Education details: POC, goals Person educated: Patient Education method: Explanation, Demonstration, Tactile cues, Verbal cues, and Handouts Education comprehension: verbalized understanding, returned demonstration, verbal cues required, tactile cues required, and needs further education  HOME EXERCISE PROGRAM: Access Code: [email or web address] URL: [email or web address] Date: 01/08/2023 Prepared by: Linton Rump Speaks  Exercises - Seated Hip Adduction Squeeze with Ball  - 1 x daily - 7 x weekly - 3 sets - 10 reps - Sit to Stand Without Arm Support  - 1 x daily - 7 x weekly - 3 sets - 10 reps Access Code: [email or web address] URL: [email or web address] Date: 02/16/2023 Prepared by: Verta Ellen  Exercises - Seated Hip Adduction Squeeze with Ball  - 1 x daily - 7 x weekly - 3 sets - 10 reps - Sit to  Stand Without Arm Support  - 1 x daily - 7 x weekly - 3 sets - 10 reps - Hooklying Clamshell with Resistance  - 1 x daily - 7 x weekly - 2-3 sets - 10 reps - Active Straight Leg Raise with Quad Set  - 1 x daily - 7 x weekly - 2-3 sets - 10 reps - Standing Hip Abduction with Counter Support  - 1 x daily - 7 x weekly - 3 sets - 10 reps - Standing Hip Extension with Counter Support  - 1 x daily - 7 x weekly - 3 sets - 10 reps - Standing March with Counter Support  - 1 x daily - 7 x weekly - 3 sets - 10 reps  ASSESSMENT:  CLINICAL IMPRESSION:  Christasia continues to be limited with ankle DF ROM, still measured 15 deg today. She continues to show proximal weakness evident by muscle quivering with standing unsupported. We worked more on proximal strengthening to improve distal support to ankles. Cues provided throughout session as needed, mostly with S/L hip abduction to avoid hip flexion as well with SLR to keep TKE. She continues to benefit from therapy.  OBJECTIVE IMPAIRMENTS: decreased  activity tolerance, decreased balance, decreased mobility, difficulty walking, decreased ROM, decreased strength, impaired perceived functional ability, impaired flexibility, postural dysfunction, and pain.   ACTIVITY LIMITATIONS: carrying, standing, squatting, stairs, transfers, bathing, toileting, locomotion level, and caring for others  PARTICIPATION LIMITATIONS: meal prep, cleaning, laundry, driving, shopping, community activity, and yard work  PERSONAL FACTORS: Fitness, Past/current experiences, Time since onset of injury/illness/exacerbation, Transportation, and 1-2 comorbidities: smoker,emphysema, s/o osteomyelitis L ankle  are also affecting patient's functional outcome.   REHAB POTENTIAL: Fair due to medical co morbidities  CLINICAL DECISION MAKING: Evolving/moderate complexity  EVALUATION COMPLEXITY: Moderate   GOALS: Goals reviewed with patient? Yes  SHORT TERM GOALS: Target date: 2 weeks,  01/13/23 I HEP Baseline: Goal status: MET 01/22/23  LONG TERM GOALS: Target date: 03/03/23  Gait speed greater than 1 m/sec with LAD over 800' for community distances Baseline: less than 0.5 m/sec Goal status: INITIAL  2.  LEFS Baseline: 14/80 Goal status: INITIAL  3.  Improve R ankle dorsiflexion ROM from -15 degrees to neutral for improved walking mechanics and transfer efficiency Baseline:  Goal status: ONGOING 01/22/23  4.  Strength B hips, knees 5/5 for improved endurance, activity tolerance, balance Baseline:4/5  Goal status: ONGOING 02/12/23  5.  Strength R ankle 3+5 all planes  Baseline: 3-/5 Goal status: INITIAL   PLAN: + PT FREQUENCY: 1-2x/week  PT DURATION: 8 weeks  PLANNED INTERVENTIONS: Therapeutic exercises, Therapeutic activity, Neuromuscular re-education, Balance training, Gait training, Patient/Family education, Self Care, and Joint mobilization  PLAN FOR NEXT SESSION: progress with therex to engage, strengthen R ankle, progress with gait training.  Darleene Cleaver, PTA  02/16/23 5:17 PM

## 2023-02-17 ENCOUNTER — Telehealth: Payer: Self-pay | Admitting: Urology

## 2023-02-17 NOTE — Telephone Encounter (Signed)
[facility] Mdx results reviewed. Rx for Cipro x 5 days sent to pharmacy for treatment.

## 2023-02-19 ENCOUNTER — Ambulatory Visit: Payer: 59

## 2023-02-19 NOTE — Therapy (Deleted)
OUTPATIENT PHYSICAL THERAPY LOWER EXTREMITY TREATMENT   Patient Name: Brianna Bowman MRN: [id number] DOB:1971/01/04, 52 y.o., female Today's Date: 02/19/2023  END OF SESSION:             Past Medical History:  Diagnosis Date   Allergy    Anemia    with pregnancy   Anxiety    due to condition   Arthritis    Cataract    Depression    GERD (gastroesophageal reflux disease)    Headache(784.0)    history of   Hyperlipidemia    PONV (postoperative nausea and vomiting)    with epidural anad 04/28/13 BP low and took longer to wake after surgery 04/28/2012   Past Surgical History:  Procedure Laterality Date   Broken ankle Right 2024   FRACTURE SURGERY     Brianna femur   HARDWARE REMOVAL Left 11/01/2012   Procedure: HARDWARE REMOVAL;  Surgeon: Brianna Manges, MD;  Location: [facility] OR;  Service: Orthopedics;  Laterality: Left;  Left Ankle Removal Fibula Plate and 10 screws, Irrigation and Debridement   MANDIBLE FRACTURE SURGERY     ORIF ANKLE FRACTURE  04/28/2012   Procedure: OPEN REDUCTION INTERNAL FIXATION (ORIF) ANKLE FRACTURE;  Surgeon: Brianna Manges, MD;  Location: [facility] OR;  Service: Orthopedics;  Laterality: Left;  Open reduction internal fixation left ankle bimalleolar fracture   Patient Active Problem List   Diagnosis Date Noted   Motion sickness 12/15/2022   White matter abnormality on MRI of brain 09/22/2022   Chronic migraine w/o aura, not intractable, w/o stat migr 09/22/2022   Opioid use 09/10/2022   Acute stress reaction 09/10/2022   Pink-colored urine 09/10/2022   Closed displaced trimalleolar fracture of lower leg, right, initial encounter 08/23/2022   Allergic reaction 08/22/2022   Screening for diabetes mellitus 08/12/2022   Hyperlipidemia 08/12/2022   Elevated blood pressure reading 08/12/2022   Need for hepatitis C screening test 08/12/2022   Encounter for screening mammogram for malignant neoplasm of breast 08/12/2022   Colon cancer screening 08/12/2022    Blurry vision 08/12/2022   Ankle pain, chronic 08/12/2022   Need for diphtheria-tetanus-pertussis (Tdap) vaccine 08/12/2022   Chest pain 02/09/2018   Anxiety state 02/09/2018   History of migraine headaches 02/09/2018   Emphysema of lung (HCC) 02/09/2018   Neuralgia 06/12/2017   Tobacco abuse 11/29/2012   Bone infection of left ankle (HCC) 11/01/2012   Closed fracture of left ankle 04/28/2012    Class: Acute    PCP: Brianna Slater, MD  REFERRING PROVIDER: Willia Craze, MD  REFERRING DIAG: Brianna trimalleolar ankle fracture  THERAPY DIAG:  No diagnosis found.  Rationale for Evaluation and Treatment: Rehabilitation  ONSET DATE: 08/23/2022  SUBJECTIVE:   SUBJECTIVE STATEMENT: Less pain today.  PERTINENT HISTORY: Injured Brianna ankle on slide, did not undergo surgical fixation, now 4 months post op PAIN:  Are you having pain? Yes: NPRS scale: 4, Brianna ankle, 5 Brianna ankle/10 Pain location: b ankles  Pain description: Brianna ache, Brianna throbs and constantly hurts  Aggravating factors: weight bearing Relieving factors: sitting  PRECAUTIONS: Fall  WEIGHT BEARING RESTRICTIONS: Yes WBAT advance from boot to shoe gradually  FALLS:  Has patient fallen in last 6 months? Yes. Number of falls 2  LIVING ENVIRONMENT: Lives with: lives with their family Lives in: House/apartment Stairs: Yes: External: 1 .5 steps; on right going up Has following equipment at home:  knee scooter, 3 wheeled walker  OCCUPATION: disabled  PLOF: Independent with basic ADLs  PATIENT GOALS: get Brianna ankle to heal and be able to walk without device  NEXT MD VISIT: 02/26/23  OBJECTIVE:   DIAGNOSTIC FINDINGS: XRs of the right ankle taken 12/26/2022 were independently reviewed and interpreted, showing a bimalleolar ankle fracture. No callus formation seen.  Talus is under the tibia but translated slightly laterally.  Displaced and laterally translated lateral malleolus.  There is a displaced medial malleolus fracture. Alignment  appears similar to prior films. No new fractures seen.     PATIENT SURVEYS:  LEFS 14/80  COGNITION: Overall cognitive status: Within functional limits for tasks assessed     SENSATION: WFL  EDEMA:  Mild Brianna lateral malleolus  POSTURE: weight shift left and hyperextends Brianna knee, ER Brianna hip and ankle  PALPATION: Non tender with palpation Brianna ankle or achilles  LOWER EXTREMITY ROM:  Passive ROM Right eval Right 01/22/23  Hip flexion    Hip extension    Hip abduction    Hip adduction    Hip internal rotation    Hip external rotation    Knee flexion    Knee extension    Ankle dorsiflexion -15 -16*  Ankle plantarflexion 22 40*  Ankle inversion 5 15*  Ankle eversion 0 12*   (Blank rows = not tested)  LOWER EXTREMITY MMT: B hips, knee musculature grossly 4/5 Brianna ankle NT Brianna ankle plantarflexion 3-/5 DF 3-/5 INV 3-/5  01/22/23-  Brianna DF 4 in available ROM, inversion/eversion 4+/5 in available ROM, Brianna quads 4+/5, Brianna Bowman 4-/5, Brianna quad 4-/5, Brianna Bowman 3+/5 02/12/23- hips globally 4+/5, except Bil hip extension; B knees 4+/5   FUNCTIONAL TESTS:  NA  GAIT: Distance walked: with FWW x 100' in clnic Assistive device utilized: Environmental consultant - 2 wheeled Level of assistance: SBA Comments: cues for step to pattern and sequencing leading with Brianna LE  Patient also using knee scooter upon arrival and to maneuver throughout clnic   TODAY'S TREATMENT:                                                                                                                              DATE: 02/16/23 Nustep L5x98min LE only Standing marching, hip abduction, hip extension  x 10 B Bridge 2x10 Supine hip ADD with ball 2x10 5 sec hold Supine SLR 2# x 10 bil S/Brianna hip abduction 2# x 10 bil - cues to keep LE nuetral Prone hip extension 2x5 B 2# Ankle DF measured: 15 deg Brianna 02/12/23 Bike L8x30min Assessed LE strength Heel/toe raise on airex x 10 eac Step lunge for ankle dorsiflexion x 10  Seated ankle dorsiflexion GTB 2x10  B  Manual: Ankle mobilizations for DF B  PROM into dorsiflexion  02/05/23:  Nustep Brianna 4, 6 min Standing B feet on airex for toe raises Standing Brianna foot on airex, Brianna foot behind, for forward lunges, pushing Brianna foot into plantarflexion Side stepping along counter, 10' x Brianna with red theraband around thighs Leg  press, 15# 2 x 15 reps  Manual: Measured Brianna ankle DF and assessed Brianna ankle strength Long sitting for gentle gr 2 and 3 mobs mid foot, rear foot, fore foot, also for traction/distraction mid foot and rearfoot Mobilization with movement long sitting for talar glides post with ankle df stretch Mob with movement combined with lunges Brianna foot on step with AP glides subtalar jt, 2 sets 15   Gait, with walker, encouraged to elongate Brianna step length to achieve Brianna foot dorsiflexion   02/02/23 Nustep L4x46min Gait around clinic 180 ft with rolling walker- cues for equal step length Fwd step on airex then weight shift x 10  Retro step x 15 with RW Gastroc stretch runner stretch and heel drop from book Supine clams blue TB 10x5" Supine SLR 2x10 bil  01/29/23 Recumbent Bike L7x9min 4 way ankle weight shifts x 10  Fwd step on airex then weight shift x 10  Retro step with RW x 10  Gastroc stretch 2x30 sec  Supine SLR x 12 bil Supine clamshell blue TB 10x3"  01/27/23:   Manual:  Ankle mobilizations subtalar jt, for dorsiflexion, gentle, to tolerance Side lying for myofascial release/cross friction mass medial Brianna gastroc attachment along rib  Recumbent cycle 6 min level 7 Standing Brianna foot on 1" block, Brianna foot back for gastroc stretch/ lunges to tolerance mass practice  Standing Brianna foot on 4" block, Brianna foot planted, in walker, with light UE support and horizontal head turns B, pt with pain Brianna subtalar jt so discontinued Heel/toe rocks B feet on airex in walker to tolerance, mass practice Seated leg press on fitter 2 bands for resistance , Brianna LE 15 x 2 Seated hamstring curls green band, 15 x  2  sets   01/22/23  Objective measures, appropriate education, brief goal review  TherEx  Seated rockerboard x20 with 3-5 seconds holds in each position Seated rockerboard x20 inversion/eversion 3-5 second holds each position  Gastroc stretch 4x30 seconds  Standing wt shifts feet shoulder width apart x10  Heel-toe rocking x10 with Brianna foot forward, x10 with Brianna foot forward   Manual   Ankle dorsiflexion mobs grade III  Heel inversion/eversion grade III Forefoot mobility grade III     01/15/23 Seated toy yoga Brianna x 10  Toe curls 2x10 BAPS fwd, circles x 10 bil PF with blue TB x 10  Knee extension with blue TB x 10 Brianna Recumbent Bike L7x50min Fwd WS onto 4' step while sitting x 10  Heel slide RLE x 10   01/13/23: Measured dorsiflexion -8 today initially Seated for BAPS board Brianna ankle cw, ccw  Seated picking up marbles Brianna foot Toe yoga Brianna Wash cloth scrunches Brianna foot on floor Standing for side stepping along counter in crocs Standing weight shifting side to side, cues to maintain neutral hip position as she tends to flex Brianna hip to accommodate for Brianna ankle plantarflexion position.  Standing with B forefeet on 1/2" book for heel raises, holding counter to isolate B plantarflexor strength Seated resisted long arc quads with green band Recumbent bike. Level 5 for 5 min, then level 7 for 2 min  01/08/23 Dorsiflexion -15 initially , improved to -5 after stretching and therex Manual: brief stretching Brianna ankle , mid foot, into dorsiflexion and metatarsal flexion   Therex: reviewed ex from initial session:    Seated ankle pumps with green t band Standing Brianna ankle dorsiflexion stretch, runner's stretch in standing, with theraband across Brianna ant ankle  to stabilize subtalar jt and isolate Brianna ankle plantarflexors. Added sit to stand from elevated table with hip adductor ball squeeze, to further isolate quads musculature Seated long arc quads with 4#  10 x , therapist stopped due to crepitus Brianna  patella Seated ball rolls with plantar foot on ball surface multiple directions, encouraged to engage intrinsics Brianna foot /ankle  Recumbent bike level 6 x  10 min Instructed and practiced stair climbing, up and down one step with b UE support. In the boot.   12/30/22  Eval, inst in the exercises as described below.  Brief gait training with boot and clinic's front wheeled walker.  Provided with adjustable heel lift to use in boot for gradual Brianna ankle dorsiflexion stretch, advised to remove wedges as tolerated.  Advised to obtain FWW for home, patient states she has a 3 wheeled walker but unsteady.  Can borrow FWW from family member  PATIENT EDUCATION:  Education details: POC, goals Person educated: Patient Education method: Explanation, Demonstration, Tactile cues, Verbal cues, and Handouts Education comprehension: verbalized understanding, returned demonstration, verbal cues required, tactile cues required, and needs further education  HOME EXERCISE PROGRAM: Access Code: [email or web address] URL: [email or web address] Date: 01/08/2023 Prepared by: Linton Rump [employer]  Exercises - Seated Hip Adduction Squeeze with Ball  - 1 x daily - 7 x weekly - 3 sets - 10 reps - Sit to Stand Without Arm Support  - 1 x daily - 7 x weekly - 3 sets - 10 reps Access Code: [email or web address] URL: [email or web address] Date: 02/16/2023 Prepared by: Verta Ellen  Exercises - Seated Hip Adduction Squeeze with Ball  - 1 x daily - 7 x weekly - 3 sets - 10 reps - Sit to Stand Without Arm Support  - 1 x daily - 7 x weekly - 3 sets - 10 reps - Hooklying Clamshell with Resistance  - 1 x daily - 7 x weekly - 2-3 sets - 10 reps - Active Straight Leg Raise with Quad Set  - 1 x daily - 7 x weekly - 2-3 sets - 10 reps - Standing Hip Abduction with Counter Support  - 1 x daily - 7 x weekly - 3 sets - 10 reps - Standing Hip Extension with Counter Support  - 1 x daily - 7 x weekly - 3 sets - 10 reps - Standing March with  Counter Support  - 1 x daily - 7 x weekly - 3 sets - 10 reps  ASSESSMENT:  CLINICAL IMPRESSION:  Brianna Bowman continues to be limited with ankle DF ROM, still measured 15 deg today. She continues to show proximal weakness evident by muscle quivering with standing unsupported. We worked more on proximal strengthening to improve distal support to ankles. Cues provided throughout session as needed, mostly with S/Brianna hip abduction to avoid hip flexion as well with SLR to keep TKE. She continues to benefit from therapy.  OBJECTIVE IMPAIRMENTS: decreased activity tolerance, decreased balance, decreased mobility, difficulty walking, decreased ROM, decreased strength, impaired perceived functional ability, impaired flexibility, postural dysfunction, and pain.   ACTIVITY LIMITATIONS: carrying, standing, squatting, stairs, transfers, bathing, toileting, locomotion level, and caring for others  PARTICIPATION LIMITATIONS: meal prep, cleaning, laundry, driving, shopping, community activity, and yard work  PERSONAL FACTORS: Fitness, Past/current experiences, Time since onset of injury/illness/exacerbation, Transportation, and 1-2 comorbidities: smoker,emphysema, s/o osteomyelitis Brianna ankle  are also affecting patient's functional outcome.   REHAB POTENTIAL: Fair due to medical co morbidities  CLINICAL DECISION MAKING: Evolving/moderate complexity  EVALUATION COMPLEXITY: Moderate  GOALS: Goals reviewed with patient? Yes  SHORT TERM GOALS: Target date: 2 weeks, 01/13/23 I HEP Baseline: Goal status: MET 01/22/23  LONG TERM GOALS: Target date: 03/03/23  Gait speed greater than 1 m/sec with LAD over 800' for community distances Baseline: less than 0.5 m/sec Goal status: INITIAL  2.  LEFS Baseline: 14/80 Goal status: INITIAL  3.  Improve Brianna ankle dorsiflexion ROM from -15 degrees to neutral for improved walking mechanics and transfer efficiency Baseline:  Goal status: ONGOING 01/22/23  4.  Strength B hips,  knees 5/5 for improved endurance, activity tolerance, balance Baseline:4/5  Goal status: ONGOING 02/12/23  5.  Strength Brianna ankle 3+5 all planes  Baseline: 3-/5 Goal status: INITIAL   PLAN: + PT FREQUENCY: 1-2x/week  PT DURATION: 8 weeks  PLANNED INTERVENTIONS: Therapeutic exercises, Therapeutic activity, Neuromuscular re-education, Balance training, Gait training, Patient/Family education, Self Care, and Joint mobilization  PLAN FOR NEXT SESSION: progress with therex to engage, strengthen Brianna ankle, progress with gait training.  Nijee Heatwole Frazier Richards, PT  02/19/23 2:56 PM

## 2023-02-24 ENCOUNTER — Ambulatory Visit: Payer: 59 | Attending: Orthopedic Surgery

## 2023-02-24 ENCOUNTER — Other Ambulatory Visit: Payer: Self-pay

## 2023-02-24 DIAGNOSIS — S82891A Other fracture of right lower leg, initial encounter for closed fracture: Secondary | ICD-10-CM | POA: Diagnosis present

## 2023-02-24 DIAGNOSIS — R262 Difficulty in walking, not elsewhere classified: Secondary | ICD-10-CM | POA: Diagnosis present

## 2023-02-24 DIAGNOSIS — M25671 Stiffness of right ankle, not elsewhere classified: Secondary | ICD-10-CM | POA: Insufficient documentation

## 2023-02-24 NOTE — Therapy (Signed)
OUTPATIENT PHYSICAL THERAPY LOWER EXTREMITY TREATMENT   Patient Name: Brianna Bowman MRN: [id number] DOB:[birth year], 52 y.o., female Today's Date: 02/24/2023  END OF SESSION:  PT End of Session - 02/24/23 1701     Date for PT Re-Evaluation 04/21/23    Progress Note Due on Visit 20    PT Start Time 1620    PT Stop Time 1706    PT Time Calculation (min) 46 min    Activity Tolerance Patient tolerated treatment well    Behavior During Therapy WFL for tasks assessed/performed                      Past Medical History:  Diagnosis Date   Allergy    Anemia    with pregnancy   Anxiety    due to condition   Arthritis    Cataract    Depression    GERD (gastroesophageal reflux disease)    Headache(784.0)    history of   Hyperlipidemia    PONV (postoperative nausea and vomiting)    with epidural anad 04/28/13 BP low and took longer to wake after surgery 04/28/2012   Past Surgical History:  Procedure Laterality Date   Broken ankle Right 2024   FRACTURE SURGERY     L femur   HARDWARE REMOVAL Left 11/01/2012   Procedure: HARDWARE REMOVAL;  Surgeon: Eldred Manges, MD;  Location: [facility] OR;  Service: Orthopedics;  Laterality: Left;  Left Ankle Removal Fibula Plate and 10 screws, Irrigation and Debridement   MANDIBLE FRACTURE SURGERY     ORIF ANKLE FRACTURE  04/28/2012   Procedure: OPEN REDUCTION INTERNAL FIXATION (ORIF) ANKLE FRACTURE;  Surgeon: Eldred Manges, MD;  Location: [facility] OR;  Service: Orthopedics;  Laterality: Left;  Open reduction internal fixation left ankle bimalleolar fracture   Patient Active Problem List   Diagnosis Date Noted   Motion sickness 12/15/2022   White matter abnormality on MRI of brain 09/22/2022   Chronic migraine w/o aura, not intractable, w/o stat migr 09/22/2022   Opioid use 09/10/2022   Acute stress reaction 09/10/2022   Pink-colored urine 09/10/2022   Closed displaced trimalleolar fracture of lower leg, right, initial encounter 08/23/2022    Allergic reaction 08/22/2022   Screening for diabetes mellitus 08/12/2022   Hyperlipidemia 08/12/2022   Elevated blood pressure reading 08/12/2022   Need for hepatitis C screening test 08/12/2022   Encounter for screening mammogram for malignant neoplasm of breast 08/12/2022   Colon cancer screening 08/12/2022   Blurry vision 08/12/2022   Ankle pain, chronic 08/12/2022   Need for diphtheria-tetanus-pertussis (Tdap) vaccine 08/12/2022   Chest pain 02/09/2018   Anxiety state 02/09/2018   History of migraine headaches 02/09/2018   Emphysema of lung (HCC) 02/09/2018   Neuralgia 06/12/2017   Tobacco abuse 11/29/2012   Bone infection of left ankle (HCC) 11/01/2012   Closed fracture of left ankle 04/28/2012    Class: Acute    PCP: Modena Slater, MD  REFERRING PROVIDER: Willia Craze, MD  REFERRING DIAG: R trimalleolar ankle fracture  THERAPY DIAG:  Difficulty in walking, not elsewhere classified  Closed fracture of right ankle, initial encounter  Stiffness of right ankle, not elsewhere classified  Rationale for Evaluation and Treatment: Rehabilitation  ONSET DATE: 08/23/2022  SUBJECTIVE:   SUBJECTIVE STATEMENT: Less pain today.  PERTINENT HISTORY: Injured R ankle on slide, did not undergo surgical fixation, now 4 months post op PAIN:  Are you having pain? Yes: NPRS scale: 4, L ankle, 5 R  ankle/10 Pain location: b ankles  Pain description: L ache, R throbs and constantly hurts  Aggravating factors: weight bearing Relieving factors: sitting  PRECAUTIONS: Fall  WEIGHT BEARING RESTRICTIONS: Yes WBAT advance from boot to shoe gradually  FALLS:  Has patient fallen in last 6 months? Yes. Number of falls 2  LIVING ENVIRONMENT: Lives with: lives with their family Lives in: House/apartment Stairs: Yes: External: 1 .5 steps; on right going up Has following equipment at home:  knee scooter, 3 wheeled walker  OCCUPATION: disabled  PLOF: Independent with basic ADLs  PATIENT  GOALS: get R ankle to heal and be able to walk without device  NEXT MD VISIT: 02/26/23  OBJECTIVE:   DIAGNOSTIC FINDINGS: XRs of the right ankle taken 12/26/2022 were independently reviewed and interpreted, showing a bimalleolar ankle fracture. No callus formation seen.  Talus is under the tibia but translated slightly laterally.  Displaced and laterally translated lateral malleolus.  There is a displaced medial malleolus fracture. Alignment appears similar to prior films. No new fractures seen.     PATIENT SURVEYS:  LEFS 14/80  COGNITION: Overall cognitive status: Within functional limits for tasks assessed     SENSATION: WFL  EDEMA:  Mild R lateral malleolus  POSTURE: weight shift left and hyperextends L knee, ER L hip and ankle  PALPATION: Non tender with palpation R ankle or achilles  LOWER EXTREMITY ROM:  Passive ROM Right eval Right 01/22/23  Hip flexion    Hip extension    Hip abduction    Hip adduction    Hip internal rotation    Hip external rotation    Knee flexion    Knee extension    Ankle dorsiflexion -15 -16*  Ankle plantarflexion 22 40*  Ankle inversion 5 15*  Ankle eversion 0 12*   (Blank rows = not tested)  LOWER EXTREMITY MMT: B hips, knee musculature grossly 4/5 L ankle NT R ankle plantarflexion 3-/5 DF 3-/5 INV 3-/5  01/22/23-  R DF 4 in available ROM, inversion/eversion 4+/5 in available ROM, R quads 4+/5, R hams 4-/5, L quad 4-/5, L hams 3+/5 02/12/23- hips globally 4+/5, except Bil hip extension; B knees 4+/5   FUNCTIONAL TESTS:  NA  GAIT: Distance walked: with FWW x 100' in clnic Assistive device utilized: Environmental consultant - 2 wheeled Level of assistance: SBA Comments: cues for step to pattern and sequencing leading with R LE  Patient also using knee scooter upon arrival and to maneuver throughout clnic   TODAY'S TREATMENT:                                                                                                                               DATE: 02/24/23:  Therapeutic exercise:  instructed, progressed the patient in the following ex to engage LE stability , strength Mobilization with movement R ankle dorsiflexion with strap around lower R leg, lunges forward, with therapist stabilizing R subtalar jt. Pain free, 15 reps, 2  bouts Leg press, 15 #, 2 sets 15 reps B LE's Leg press 15# for B ankle plantarflexion, 2 sets 15 reps Seated B knee ext 5# 15 reps, then 10# 15 reps Gait training with st cane L hand and R hand, 60' at a time.   02/16/23 Nustep L5x60min LE only Standing marching, hip abduction, hip extension  x 10 B Bridge 2x10 Supine hip ADD with ball 2x10 5 sec hold Supine SLR 2# x 10 bil S/L hip abduction 2# x 10 bil - cues to keep LE nuetral Prone hip extension 2x5 B 2# Ankle DF measured: 15 deg R  02/12/23 Bike L8x58min Assessed LE strength Heel/toe raise on airex x 10 eac Step lunge for ankle dorsiflexion x 10  Seated ankle dorsiflexion GTB 2x10 B  Manual: Ankle mobilizations for DF B  PROM into dorsiflexion  02/05/23:  Nustep L 4, 6 min Standing B feet on airex for toe raises Standing L foot on airex, R foot behind, for forward lunges, pushing R foot into plantarflexion Side stepping along counter, 10' x r with red theraband around thighs Leg press, 15# 2 x 15 reps  Manual: Measured R ankle DF and assessed R ankle strength Long sitting for gentle gr 2 and 3 mobs mid foot, rear foot, fore foot, also for traction/distraction mid foot and rearfoot Mobilization with movement long sitting for talar glides post with ankle df stretch Mob with movement combined with lunges R foot on step with AP glides subtalar jt, 2 sets 15   Gait, with walker, encouraged to elongate L step length to achieve R foot dorsiflexion   02/02/23 Nustep L4x20min Gait around clinic 180 ft with rolling walker- cues for equal step length Fwd step on airex then weight shift x 10  Retro step x 15 with RW Gastroc stretch runner stretch  and heel drop from book Supine clams blue TB 10x5" Supine SLR 2x10 bil  01/29/23 Recumbent Bike L7x82min 4 way ankle weight shifts x 10  Fwd step on airex then weight shift x 10  Retro step with RW x 10  Gastroc stretch 2x30 sec  Supine SLR x 12 bil Supine clamshell blue TB 10x3"  01/27/23:   Manual:  Ankle mobilizations subtalar jt, for dorsiflexion, gentle, to tolerance Side lying for myofascial release/cross friction mass medial R gastroc attachment along rib  Recumbent cycle 6 min level 7 Standing L foot on 1" block, R foot back for gastroc stretch/ lunges to tolerance mass practice  Standing L foot on 4" block, R foot planted, in walker, with light UE support and horizontal head turns B, pt with pain R subtalar jt so discontinued Heel/toe rocks B feet on airex in walker to tolerance, mass practice Seated leg press on fitter 2 bands for resistance , R LE 15 x 2 Seated hamstring curls green band, 15 x  2 sets   01/22/23  Objective measures, appropriate education, brief goal review  TherEx  Seated rockerboard x20 with 3-5 seconds holds in each position Seated rockerboard x20 inversion/eversion 3-5 second holds each position  Gastroc stretch 4x30 seconds  Standing wt shifts feet shoulder width apart x10  Heel-toe rocking x10 with L foot forward, x10 with R foot forward   Manual   Ankle dorsiflexion mobs grade III  Heel inversion/eversion grade III Forefoot mobility grade III     01/15/23 Seated toy yoga R x 10  Toe curls 2x10 BAPS fwd, circles x 10 bil PF with blue TB x 10  Knee  extension with blue TB x 10 R Recumbent Bike L7x4min Fwd WS onto 4' step while sitting x 10  Heel slide RLE x 10   01/13/23: Measured dorsiflexion -8 today initially Seated for BAPS board R ankle cw, ccw  Seated picking up marbles R foot Toe yoga R Wash cloth scrunches R foot on floor Standing for side stepping along counter in crocs Standing weight shifting side to side, cues to  maintain neutral hip position as she tends to flex R hip to accommodate for R ankle plantarflexion position.  Standing with B forefeet on 1/2" book for heel raises, holding counter to isolate B plantarflexor strength Seated resisted long arc quads with green band Recumbent bike. Level 5 for 5 min, then level 7 for 2 min  01/08/23 Dorsiflexion -15 initially , improved to -5 after stretching and therex Manual: brief stretching R ankle , mid foot, into dorsiflexion and metatarsal flexion   Therex: reviewed ex from initial session:    Seated ankle pumps with green t band Standing R ankle dorsiflexion stretch, runner's stretch in standing, with theraband across R ant ankle to stabilize subtalar jt and isolate R ankle plantarflexors. Added sit to stand from elevated table with hip adductor ball squeeze, to further isolate quads musculature Seated long arc quads with 4#  10 x , therapist stopped due to crepitus R patella Seated ball rolls with plantar foot on ball surface multiple directions, encouraged to engage intrinsics R foot /ankle  Recumbent bike level 6 x  10 min Instructed and practiced stair climbing, up and down one step with b UE support. In the boot.   12/30/22  Eval, inst in the exercises as described below.  Brief gait training with boot and clinic's front wheeled walker.  Provided with adjustable heel lift to use in boot for gradual R ankle dorsiflexion stretch, advised to remove wedges as tolerated.  Advised to obtain FWW for home, patient states she has a 3 wheeled walker but unsteady.  Can borrow FWW from family member  PATIENT EDUCATION:  Education details: POC, goals Person educated: Patient Education method: Explanation, Demonstration, Tactile cues, Verbal cues, and Handouts Education comprehension: verbalized understanding, returned demonstration, verbal cues required, tactile cues required, and needs further education  HOME EXERCISE PROGRAM: Access Code: [email or web address] URL:  [email or web address] Date: 01/08/2023 Prepared by: Linton Rump Monterius Rolf  Exercises - Seated Hip Adduction Squeeze with Ball  - 1 x daily - 7 x weekly - 3 sets - 10 reps - Sit to Stand Without Arm Support  - 1 x daily - 7 x weekly - 3 sets - 10 reps Access Code: [email or web address] URL: [email or web address] Date: 02/16/2023 Prepared by: Verta Ellen  Exercises - Seated Hip Adduction Squeeze with Ball  - 1 x daily - 7 x weekly - 3 sets - 10 reps - Sit to Stand Without Arm Support  - 1 x daily - 7 x weekly - 3 sets - 10 reps - Hooklying Clamshell with Resistance  - 1 x daily - 7 x weekly - 2-3 sets - 10 reps - Active Straight Leg Raise with Quad Set  - 1 x daily - 7 x weekly - 2-3 sets - 10 reps - Standing Hip Abduction with Counter Support  - 1 x daily - 7 x weekly - 3 sets - 10 reps - Standing Hip Extension with Counter Support  - 1 x daily - 7 x weekly - 3 sets - 10 reps - Standing March with Counter Support  - 1 x  daily - 7 x weekly - 3 sets - 10 reps  ASSESSMENT:  CLINICAL IMPRESSION:  Girtrude is attending skilled physical therapy to address R ankle fracture. She has regained ability to dorsiflex to neutral today.  Still proximal B hip and knee weakness, R ankle plantarflexor weakness.  She is to see the orthopedist tomorrow.  She is still reliant on walker for gait , would benefit from PT for additional bulk strengthening B LE's after today if the orthopedist agrees.  Unclear how much more improvement will occur with ankle  jt due to severity of break and slowed healing.  Will await results of orthopedist appt then extend PT and update care plan as needed.  OBJECTIVE IMPAIRMENTS: decreased activity tolerance, decreased balance, decreased mobility, difficulty walking, decreased ROM, decreased strength, impaired perceived functional ability, impaired flexibility, postural dysfunction, and pain.   ACTIVITY LIMITATIONS: carrying, standing, squatting, stairs, transfers, bathing,  toileting, locomotion level, and caring for others  PARTICIPATION LIMITATIONS: meal prep, cleaning, laundry, driving, shopping, community activity, and yard work  PERSONAL FACTORS: Fitness, Past/current experiences, Time since onset of injury/illness/exacerbation, Transportation, and 1-2 comorbidities: smoker,emphysema, s/o osteomyelitis L ankle  are also affecting patient's functional outcome.   REHAB POTENTIAL: Fair due to medical co morbidities  CLINICAL DECISION MAKING: Evolving/moderate complexity  EVALUATION COMPLEXITY: Moderate   GOALS: Goals reviewed with patient? Yes  SHORT TERM GOALS: Target date: 2 weeks, 01/13/23 I HEP Baseline: Goal status: MET 01/22/23  LONG TERM GOALS: Target date: 03/03/23 updated to 04/21/23 :   Gait speed greater than 1 m/sec with LAD over 800' for community distances Baseline: less than 0.5 m/sec Goal status: PROGRESSING: with 3 wheeled walker 0.8 m/sec with st cane today, very slow/delayed  2.  LEFS improve to 50/80 Baseline: 14/80 Goal status: 02/24/23: 33/80 today, improved  3.  Improve R ankle dorsiflexion ROM from -15 degrees to neutral for improved walking mechanics and transfer efficiency Baseline:  Goal status: ONGOING 01/22/23 02/24/23:  measured at neutral today  4.  Strength B hips, knees 5/5 for improved endurance, activity tolerance, balance Baseline:4/5  Goal status: ONGOING 02/12/23 02/24/23:  B hips, knee musculature 4+/5  5.  Strength R ankle 3+5 all planes  Baseline: 3-/5 Goal status: PROGRESSING:  Ankle plantarflexion R 3-/5, dorsiflexion, INV, EV 3+/5    PLAN: + PT FREQUENCY: 1-2x/week  PT DURATION: 8 weeks  PLANNED INTERVENTIONS: Therapeutic exercises, Therapeutic activity, Neuromuscular re-education, Balance training, Gait training, Patient/Family education, Self Care, and Joint mobilization  PLAN FOR NEXT SESSION: progress with therex to engage, strengthen R ankle, progress with gait training.  Kaetlin Bullen Frazier Richards, PT   02/24/23 5:11 PM

## 2023-02-26 ENCOUNTER — Other Ambulatory Visit (INDEPENDENT_AMBULATORY_CARE_PROVIDER_SITE_OTHER): Payer: 59

## 2023-02-26 ENCOUNTER — Ambulatory Visit (INDEPENDENT_AMBULATORY_CARE_PROVIDER_SITE_OTHER): Payer: 59 | Admitting: Orthopedic Surgery

## 2023-02-26 DIAGNOSIS — S82891D Other fracture of right lower leg, subsequent encounter for closed fracture with routine healing: Secondary | ICD-10-CM

## 2023-02-26 NOTE — Progress Notes (Signed)
Orthopedic Surgery Office Visit   Procedure: Right ankle closed reduction and application of short leg splint Date of Injury: 08/23/2022 (~6 months from injury)   Assessment: Patient is a 52 y.o. who has a right ankle fracture/dislocation that has been treated non-operatively. 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  I also discussed the fact that some of her ankle soreness is likely due to the malalignment of the fracture -Return to office in 6 months, x-rays needed at next visit: right ankle AP/lateral/mortise weightbearing   ___________________________________________________________________________     Subjective: Patient has been doing well since she was last seen.  She has some soreness in the ankle with weightbearing but it is not as significant as the contralateral side.  She feels the soreness over the anterior aspect of the ankle.  She has slowly transitioned to walking with a cane.  She is not using a boot at this point.   Objective:   General: no acute distress, appropriate affect Neurologic: alert, answering questions appropriately, following commands Respiratory: unlabored breathing on room air Skin: No wounds or ulcers seen   MSK (RLE):             No tenderness to palpation over the ankle             Cap refill less than 2 seconds in the toes, palpable DP pulse             Sensation intact to light touch in sural/saphenous/deep peroneal/superficial peroneal/tibial nerve distributions             Plantar flexes and dorsiflexes toes, dorsiflexes and plantar flexes the ankle             ROM at the ankle from 3-30, can get to neutral passively   XRs of the  right ankle taken 02/26/2023 were independently reviewed and interpreted, showing a bimalleolar ankle fracture. No callus formation seen.  Talus is under the tibia but translated slightly laterally.  There is a displaced fibular fracture with lateral translation and a laterally displaced medial malleolus fracture.  No new fracture seen.      Patient name: Brianna Bowman Patient MRN: [id number] Date of visit: 02/26/23

## 2023-03-09 ENCOUNTER — Other Ambulatory Visit: Payer: Self-pay | Admitting: Student

## 2023-03-09 DIAGNOSIS — Z1231 Encounter for screening mammogram for malignant neoplasm of breast: Secondary | ICD-10-CM

## 2023-03-11 ENCOUNTER — Ambulatory Visit: Payer: 59

## 2023-03-11 DIAGNOSIS — Z1231 Encounter for screening mammogram for malignant neoplasm of breast: Secondary | ICD-10-CM

## 2023-03-12 ENCOUNTER — Ambulatory Visit (INDEPENDENT_AMBULATORY_CARE_PROVIDER_SITE_OTHER): Payer: 59 | Admitting: Urology

## 2023-03-12 ENCOUNTER — Encounter: Payer: Self-pay | Admitting: Urology

## 2023-03-12 VITALS — BP 126/82 | HR 93 | Ht 64.0 in | Wt 130.0 lb

## 2023-03-12 DIAGNOSIS — Z87440 Personal history of urinary (tract) infections: Secondary | ICD-10-CM | POA: Diagnosis not present

## 2023-03-12 DIAGNOSIS — N3946 Mixed incontinence: Secondary | ICD-10-CM | POA: Diagnosis not present

## 2023-03-12 LAB — URINALYSIS, ROUTINE W REFLEX MICROSCOPIC
Bilirubin, UA: NEGATIVE
Glucose, UA: NEGATIVE
Ketones, UA: NEGATIVE
Leukocytes,UA: NEGATIVE
Nitrite, UA: NEGATIVE
Protein,UA: NEGATIVE
RBC, UA: NEGATIVE
Specific Gravity, UA: 1.015 (ref 1.005–1.030)
Urobilinogen, Ur: 0.2 mg/dL (ref 0.2–1.0)
pH, UA: 5.5 (ref 5.0–7.5)

## 2023-03-12 MED ORDER — GEMTESA 75 MG PO TABS
75.0000 mg | ORAL_TABLET | Freq: Every day | ORAL | Status: DC
Start: 2023-03-12 — End: 2023-04-08

## 2023-03-12 NOTE — Progress Notes (Signed)
Assessment: 1. Mixed incontinence   2. History of UTI     Plan: I reassured the patient that her urinalysis is totally normal today and without evidence of a UTI. Continue bladder diet  Discussed repeat trial of Gemtesa 75 mg daily.  She has samples. I advised her to call with any side effects of medication. Continue pelvic floor exercises Return to office in 2 months  Chief Complaint:  Chief Complaint  Patient presents with   Urinary Incontinence    History of Present Illness:  Brianna Bowman is a 52 y.o. female who is seen for further evaluation of urinary incontinence.  She has a history of both stress and urge incontinence.  At her initial visit in 8/24, she reported frequency with daytime voiding 5-6 times.  She reported nocturia 4-5 times, urgency primarily at night with associated urge incontinence.  She had occasional stress incontinence with coughing, sneezing, and laughing.  She has used incontinence pads on occasion.  No dysuria or gross hematuria.    She has a history of UTIs. Urine culture from 3/24:  >100 K . E coli.  She was given a trial of Gemtesa 75 mg daily in 8/24.  She was also instructed on pelvic floor exercises. Urine culture from 8/24 grew >2 organisms. Resolve MDX culture from 02/11/2023 grew E. coli.  Treated with Cipro x 5 days.  She returns today for follow-up.   She only took the [place] 1 time as she felt like it made it more difficult to void.  She still has the remaining samples at home.  She has noted some decrease in her nocturia after treatment of the UTI.  She continues to have frequency, urgency, and urge incontinence.  She also has stress incontinence.  No dysuria or gross hematuria.  She is having some low back pain and is concerned that she might have a UTI.  Portions of the above documentation were copied from a prior visit for review purposes only.   Past Medical History:  Past Medical History:  Diagnosis Date   Allergy    Anemia     with pregnancy   Anxiety    due to condition   Arthritis    Cataract    Depression    GERD (gastroesophageal reflux disease)    Headache(784.0)    history of   Hyperlipidemia    PONV (postoperative nausea and vomiting)    with epidural anad 04/28/13 BP low and took longer to wake after surgery 04/28/2012    Past Surgical History:  Past Surgical History:  Procedure Laterality Date   Broken ankle Right 2024   FRACTURE SURGERY     L femur   HARDWARE REMOVAL Left 11/01/2012   Procedure: HARDWARE REMOVAL;  Surgeon: Eldred Manges, MD;  Location: [facility] OR;  Service: Orthopedics;  Laterality: Left;  Left Ankle Removal Fibula Plate and 10 screws, Irrigation and Debridement   MANDIBLE FRACTURE SURGERY     ORIF ANKLE FRACTURE  04/28/2012   Procedure: OPEN REDUCTION INTERNAL FIXATION (ORIF) ANKLE FRACTURE;  Surgeon: Eldred Manges, MD;  Location: [facility] OR;  Service: Orthopedics;  Laterality: Left;  Open reduction internal fixation left ankle bimalleolar fracture    Allergies:  Allergies  Allergen Reactions   Amoxicillin Anaphylaxis    Has patient had a PCN reaction causing immediate rash, facial/tongue/throat swelling, SOB or lightheadedness with hypotension: Yes Has patient had a PCN reaction causing severe rash involving mucus membranes or skin necrosis: No Has patient had  a PCN reaction that required hospitalization: No Has patient had a PCN reaction occurring within the last 10 years: Yes If all of the above answers are "NO", then may proceed with Cephalosporin use.    Adhesive [Tape] Hives   Aloe Hives   Rizatriptan     Heart palpitations, "jaw line felt like it was seizing up", weak, uneasy.    Shellfish Allergy Rash    Lobster    Family History:  Family History  Problem Relation Age of Onset   Cancer Mother    Multiple sclerosis Father    Pneumonia Father    Heart attack Maternal Grandfather        Died in his 81s   Colon cancer Paternal Grandfather    Colon polyps Neg Hx     Esophageal cancer Neg Hx    Rectal cancer Neg Hx    Stomach cancer Neg Hx     Social History:  Social History   Tobacco Use   Smoking status: Every Day    Current packs/day: 1.00    Average packs/day: 1 pack/day for 20.0 years (20.0 ttl pk-yrs)    Types: Cigarettes   Smokeless tobacco: Never  Substance Use Topics   Alcohol use: Yes    Comment: Occasional   Drug use: Never    ROS: Constitutional:  Negative for fever, chills, weight loss CV: Negative for chest pain, previous MI, hypertension Respiratory:  Negative for shortness of breath, wheezing, sleep apnea, frequent cough GI:  Negative for nausea, vomiting, bloody stool, GERD   Physical exam: BP 126/82   Pulse 93   Ht 5\' 4"  (1.626 m)   Wt 130 lb (59 kg)   BMI 22.31 kg/m  GENERAL APPEARANCE:  Well appearing, well developed, well nourished, NAD HEENT:  Atraumatic, normocephalic, oropharynx clear NECK:  Supple without lymphadenopathy or thyromegaly ABDOMEN:  Soft, non-tender, no masses EXTREMITIES:  Moves all extremities well, without clubbing, cyanosis, or edema NEUROLOGIC:  Alert and oriented x 3, normal gait, CN II-XII grossly intact MENTAL STATUS:  appropriate BACK:  Non-tender to palpation, No CVAT SKIN:  Warm, dry, and intact  Results: U/A: negative

## 2023-03-17 ENCOUNTER — Ambulatory Visit (HOSPITAL_BASED_OUTPATIENT_CLINIC_OR_DEPARTMENT_OTHER)
Admission: RE | Admit: 2023-03-17 | Discharge: 2023-03-17 | Disposition: A | Discharge status: Home / Self Care | Payer: 59 | Source: Ambulatory Visit | Attending: Student | Admitting: Student

## 2023-03-17 ENCOUNTER — Encounter (HOSPITAL_BASED_OUTPATIENT_CLINIC_OR_DEPARTMENT_OTHER): Payer: Self-pay

## 2023-03-17 DIAGNOSIS — Z1231 Encounter for screening mammogram for malignant neoplasm of breast: Secondary | ICD-10-CM | POA: Diagnosis present

## 2023-03-19 ENCOUNTER — Telehealth: Payer: Self-pay | Admitting: Student

## 2023-03-19 DIAGNOSIS — N6320 Unspecified lump in the left breast, unspecified quadrant: Secondary | ICD-10-CM

## 2023-03-19 NOTE — Telephone Encounter (Signed)
Called patient and talked to her about the mammogram results. They are recommending a diagnostic Mammogram of the left breast. I will order. She does stated that she had an issue with this in the past and she knows about it and she will go for the diagnostic mammogram. I will place order.

## 2023-03-20 ENCOUNTER — Other Ambulatory Visit: Payer: Self-pay | Admitting: Student

## 2023-03-20 ENCOUNTER — Encounter: Payer: Self-pay | Admitting: Student

## 2023-03-20 DIAGNOSIS — R928 Other abnormal and inconclusive findings on diagnostic imaging of breast: Secondary | ICD-10-CM

## 2023-03-23 NOTE — Progress Notes (Unsigned)
Updated mammo results showing that patient would need a ultrasound of the left breast as well as the diagnostic mammo.

## 2023-03-25 ENCOUNTER — Telehealth: Payer: Self-pay | Admitting: Student

## 2023-03-25 DIAGNOSIS — R928 Other abnormal and inconclusive findings on diagnostic imaging of breast: Secondary | ICD-10-CM

## 2023-03-25 NOTE — Telephone Encounter (Signed)
Ordered mammo for patient at the right place.

## 2023-03-26 ENCOUNTER — Ambulatory Visit: Payer: 59 | Admitting: Family Medicine

## 2023-03-26 ENCOUNTER — Other Ambulatory Visit (HOSPITAL_COMMUNITY)
Admission: RE | Admit: 2023-03-26 | Discharge: 2023-03-26 | Disposition: A | Discharge status: Home / Self Care | Payer: 59 | Source: Ambulatory Visit | Attending: Family Medicine | Admitting: Family Medicine

## 2023-03-26 ENCOUNTER — Encounter: Payer: Self-pay | Admitting: Family Medicine

## 2023-03-26 ENCOUNTER — Ambulatory Visit (HOSPITAL_BASED_OUTPATIENT_CLINIC_OR_DEPARTMENT_OTHER)
Admission: RE | Admit: 2023-03-26 | Discharge: 2023-03-26 | Disposition: A | Discharge status: Home / Self Care | Payer: 59 | Source: Ambulatory Visit | Attending: Family Medicine | Admitting: Family Medicine

## 2023-03-26 ENCOUNTER — Telehealth: Payer: Self-pay | Admitting: *Deleted

## 2023-03-26 VITALS — BP 117/76 | HR 84 | Ht 64.0 in | Wt 138.0 lb

## 2023-03-26 DIAGNOSIS — N939 Abnormal uterine and vaginal bleeding, unspecified: Secondary | ICD-10-CM | POA: Diagnosis present

## 2023-03-26 DIAGNOSIS — Z01419 Encounter for gynecological examination (general) (routine) without abnormal findings: Secondary | ICD-10-CM | POA: Diagnosis not present

## 2023-03-26 DIAGNOSIS — R928 Other abnormal and inconclusive findings on diagnostic imaging of breast: Secondary | ICD-10-CM | POA: Diagnosis not present

## 2023-03-26 NOTE — Progress Notes (Signed)
ANNUAL EXAM Patient name: Brianna Bowman MRN [id number]  Date of birth: [birth year] Chief Complaint:   Annual Exam  History of Present Illness:   Brianna Bowman is a 52 y.o.  [phone]  female  being seen today for a routine annual exam.  Current complaints: Patient having intermittent bleeding.  She will have a couple days of bleeding, then a few weeks without, then bleeding again.  Sometimes the is only spotting.  Denies pelvic pain.  No abnormal discharge  Patient had a mammogram that showed 2 masses in the left breast with calcifications that are suspicious for malignancy.  Per the patient, [facility] radiology/the breast center does not take her insurance and will not schedule her for follow-up as diagnostic mammogram and treatment are not covered.  She has history of abnormal Pap smears.  She is uncertain of the results but states that whenever she goes in for follow-up "everything is fine."  She states that it has been approximately 10 years since her last Pap smear  Patient's last menstrual period was 03/11/2023 (exact date).    Last pap about 10 years ago. H/O abnormal pap:  Last mammogram: 02/2023.  Last colonoscopy: 2024. Results were: normal. Family h/o colorectal cancer: yes       12/11/2022    3:24 PM 09/10/2022    4:42 PM 09/10/2022    3:38 PM 08/22/2022    9:03 AM 08/12/2022    3:54 PM  Depression screen PHQ 2/9  Decreased Interest 0 0  2 2  Down, Depressed, Hopeless 1 2 2 2 2   PHQ - 2 Score 1 2 2 4 4   Altered sleeping  3  1 1   Tired, decreased energy  2  3 3   Change in appetite  0  0 0  Feeling bad or failure about yourself   3  0 0  Trouble concentrating  0  1 1  Moving slowly or fidgety/restless  0  0 0  Suicidal thoughts  1  0 0  PHQ-9 Score  11  9 9   Difficult doing work/chores  --  Not difficult at all Not difficult at all         No data to display           Review of Systems:   Pertinent items are noted in HPI Denies any headaches, blurred  vision, fatigue, shortness of breath, chest pain, abdominal pain, abnormal vaginal discharge/itching/odor/irritation, problems with periods, bowel movements, urination, or intercourse unless otherwise stated above. Pertinent History Reviewed:  Reviewed past medical,surgical, social and family history.  Reviewed problem list, medications and allergies. Physical Assessment:   Vitals:   03/26/23 1521  BP: 117/76  Pulse: 84  Weight: 138 lb (62.6 kg)  Height: 5\' 4"  (1.626 m)  Body mass index is 23.69 kg/m.        Physical Examination:   General appearance - well appearing, and in no distress  Mental status - alert, oriented to person, place, and time  Psych:  She has a normal mood and affect  Skin - warm and dry, normal color, no suspicious lesions noted  Chest - effort normal, all lung fields clear to auscultation bilaterally  Heart - normal rate and regular rhythm  Neck:  midline trachea, no thyromegaly or nodules  Breasts - breasts appear normal, there are 2 masses in the left breast both are approximately 3 to 4 cm and irregularly shaped.  The first is approximately 12:00 and about 1 to 2  cm from the areola.  The second is at 3:00 and about 3 cm from the areola. no skin or nipple changes or axillary nodes  Abdomen - soft, nontender, nondistended, no masses or organomegaly  Pelvic - VULVA: normal appearing vulva with no masses, tenderness or lesions  VAGINA: normal appearing vagina with normal color and discharge, no lesions  CERVIX: normal appearing cervix without discharge or lesions, no CMT  Thin prep pap is done with HR HPV cotesting  UTERUS: uterus is felt to be normal size, shape, consistency and nontender   ADNEXA: No adnexal masses or tenderness noted.  Extremities:  No swelling or varicosities noted  Chaperone present for exam  Assessment & Plan:  1. Well woman exam with routine gynecological exam - Cytology - PAP  2. Abnormal mammogram of left breast Will contact [facility]  to see if she can obtain treatment through their scholarship funding. If not, may need to reach out to [facility] or [facility]  3. Abnormal uterine bleeding (AUB) Check [place]. May need EmBx - US PELVIC COMPLETE WITH TRANSVAGINAL; Future   Labs/procedures today:   Orders Placed This Encounter  Procedures   US PELVIC COMPLETE WITH TRANSVAGINAL    Meds: No orders of the defined types were placed in this encounter.   Follow-up: No follow-ups on file.  Levie Heritage, DO 03/26/2023 5:35 PM

## 2023-03-26 NOTE — Progress Notes (Signed)
Annual GYN Reports irreg menses for the last few years Needs Pap, declines STI

## 2023-03-26 NOTE — Telephone Encounter (Signed)
I called pt who stated the left sided pain has resolved. No other problems. Appt not needed. But she stated she needs to be referred to another place for her mammogram b/ c last place would not take her insurance - I will Chilon to f/u.

## 2023-03-26 NOTE — Telephone Encounter (Signed)
No openings until the week of Oct 23.  Can one of you ladies please reach out to the patient?    Thanks,    Music therapist  ----- Message -----  From: Brianna Bowman  Sent: 03/24/2023   4:20 PM EDT  To: [facility] Front Desk Pool  Subject: Appointment Request                              Appointment Request From: Brianna Bowman    With Provider: Modena Slater [facility]]    Preferred Date Range: Any date 03/25/2023 or later    Preferred Times: Any Time    Reason for visit: Office Visit    Comments:  I suddenly got pains on my left side all the way from ribs to waist. Also need to talk about anxiety  issues

## 2023-03-27 ENCOUNTER — Telehealth: Payer: Self-pay

## 2023-03-27 ENCOUNTER — Other Ambulatory Visit: Payer: Self-pay

## 2023-03-27 DIAGNOSIS — N6320 Unspecified lump in the left breast, unspecified quadrant: Secondary | ICD-10-CM

## 2023-03-27 NOTE — Telephone Encounter (Signed)
I have been assisting this patient with scheduling her diagnostic mammogram and ultrasound.   Discussed with the patient that the Breast Center, [facility] and [facility] do not accept her insurance. The Breast Center and [facility] will not schedule her and allow her to pay out of pocket.   [facility] gave me an estimate of $585.20 not including the physician fees. Informed patient of those numbers and she stated she wanted to call [facility] to see if they have another provider in network before scheduling.   I gave the patient the CPT codes and ICD-10 codes that she will need to give insurance for those exams. Told patient to give [place] a call back and we can send the orders if she finds somewhere in network. If not, I will facilitate the referral to [facility].

## 2023-04-01 LAB — CYTOLOGY - PAP
Comment: NEGATIVE
Comment: NEGATIVE
Comment: NEGATIVE
Diagnosis: UNDETERMINED — AB
HPV 16: NEGATIVE
HPV 18 / 45: NEGATIVE
High risk HPV: POSITIVE — AB

## 2023-04-02 ENCOUNTER — Telehealth: Payer: Self-pay

## 2023-04-02 NOTE — Telephone Encounter (Signed)
-----   Message from Levie Heritage sent at 04/01/2023  2:06 PM EDT ----- Needs to be scheduled for a colposcopy.

## 2023-04-02 NOTE — Telephone Encounter (Signed)
Called patient to inform her of abnormal pap smear and to schedule her for a Colpo. Left message for patient to call the office back. Olivene Cookston l Kaila Devries, CMA

## 2023-04-02 NOTE — Telephone Encounter (Signed)
Patient called the office back and was informed of abnormal pap smear and scheduled for a Colpo. Understanding was voiced. Brianna Bowman l [name], CMA

## 2023-04-07 ENCOUNTER — Telehealth: Payer: Self-pay

## 2023-04-07 NOTE — Telephone Encounter (Signed)
Telephoned patient at mobile number. Explained [facility] services to the patient. The patient stated she has insurance and will cover the appointment at the [facility] and declined [facility] services at this time.

## 2023-04-08 ENCOUNTER — Ambulatory Visit: Payer: 59 | Admitting: Family Medicine

## 2023-04-08 ENCOUNTER — Encounter: Payer: Self-pay | Admitting: Family Medicine

## 2023-04-08 VITALS — BP 119/86 | HR 93 | Temp 97.6°F | Resp 18 | Ht 63.0 in | Wt 136.2 lb

## 2023-04-08 DIAGNOSIS — G43709 Chronic migraine without aura, not intractable, without status migrainosus: Secondary | ICD-10-CM | POA: Diagnosis not present

## 2023-04-08 DIAGNOSIS — Z91013 Allergy to seafood: Secondary | ICD-10-CM

## 2023-04-08 DIAGNOSIS — Z720 Tobacco use: Secondary | ICD-10-CM

## 2023-04-08 DIAGNOSIS — R5383 Other fatigue: Secondary | ICD-10-CM

## 2023-04-08 DIAGNOSIS — Z79899 Other long term (current) drug therapy: Secondary | ICD-10-CM

## 2023-04-08 DIAGNOSIS — F419 Anxiety disorder, unspecified: Secondary | ICD-10-CM

## 2023-04-08 DIAGNOSIS — D649 Anemia, unspecified: Secondary | ICD-10-CM

## 2023-04-08 DIAGNOSIS — Z0000 Encounter for general adult medical examination without abnormal findings: Secondary | ICD-10-CM

## 2023-04-08 LAB — CBC WITH DIFFERENTIAL/PLATELET
Basophils Absolute: 0.1 10*3/uL (ref 0.0–0.1)
Basophils Relative: 0.8 % (ref 0.0–3.0)
Eosinophils Absolute: 0.1 10*3/uL (ref 0.0–0.7)
Eosinophils Relative: 1.6 % (ref 0.0–5.0)
HCT: 45.4 % (ref 36.0–46.0)
Hemoglobin: 15 g/dL (ref 12.0–15.0)
Lymphocytes Relative: 35.2 % (ref 12.0–46.0)
Lymphs Abs: 2.2 10*3/uL (ref 0.7–4.0)
MCHC: 32.9 g/dL (ref 30.0–36.0)
MCV: 96.2 fL (ref 78.0–100.0)
Monocytes Absolute: 0.4 10*3/uL (ref 0.1–1.0)
Monocytes Relative: 6.7 % (ref 3.0–12.0)
Neutro Abs: 3.4 10*3/uL (ref 1.4–7.7)
Neutrophils Relative %: 55.7 % (ref 43.0–77.0)
Platelets: 301 10*3/uL (ref 150.0–400.0)
RBC: 4.72 Mil/uL (ref 3.87–5.11)
RDW: 13.8 % (ref 11.5–15.5)
WBC: 6.2 10*3/uL (ref 4.0–10.5)

## 2023-04-08 LAB — IBC + FERRITIN
Ferritin: 76.6 ng/mL (ref 10.0–291.0)
Iron: 110 ug/dL (ref 42–145)
Saturation Ratios: 32.3 % (ref 20.0–50.0)
TIBC: 340.2 ug/dL (ref 250.0–450.0)
Transferrin: 243 mg/dL (ref 212.0–360.0)

## 2023-04-08 LAB — TSH: TSH: 2.37 u[IU]/mL (ref 0.35–5.50)

## 2023-04-08 LAB — B12 AND FOLATE PANEL
Folate: 7.1 ng/mL (ref >5.9)
Vitamin B-12: 294 pg/mL (ref 211–911)

## 2023-04-08 MED ORDER — EPINEPHRINE 0.3 MG/0.3ML IJ SOAJ
0.3000 mg | INTRAMUSCULAR | 2 refills | Status: AC | PRN
Start: 2023-04-08 — End: ?

## 2023-04-08 MED ORDER — HYDROXYZINE HCL 10 MG PO TABS
10.0000 mg | ORAL_TABLET | Freq: Three times a day (TID) | ORAL | 0 refills | Status: AC | PRN
Start: 2023-04-08 — End: ?

## 2023-04-08 NOTE — Progress Notes (Signed)
New Patient Office Visit  Subjective    Patient ID: Brianna Bowman, female    DOB: [birth year]  Age: 52 y.o. MRN: [id number]  CC:  Chief Complaint  Patient presents with   Establish Care    Discuss medications    HPI Brianna Bowman presents to establish care. She is here with her husband.   Discussed the use of AI scribe software for clinical note transcription with the patient, who gave verbal consent to proceed.  History of Present Illness   The patient, previously under the care of [facility]. She has a history of allergies, anemia, anxiety, arthritis, cataracts, depression, reflux, migraines, and high cholesterol. She has undergone surgeries for ankle fracture.  The patient reports a family history of lung cancer, heart failure, heart attack, and colon cancer. She consumes alcohol occasionally, less than once a week, and denies recreational drug use. She is sexually active with her husband and is not currently using any form of birth control. She has been a half-pack-a-day smoker since the age of [age].  The patient has a known allergy to amoxicillin, which caused an anaphylactic reaction during pregnancy. She also reports palpitations and weakness with rizatriptan and a reaction to shellfish that feels like her throat is closing up. Despite this, she admits to still consuming shellfish occasionally, mitigating the reaction with Benadryl.  The patient's medication regimen includes baby aspirin, Voltaren gel, ibuprofen, meloxicam, eye drops, and Nurtec for migraines. She reports needing Nurtec approximately seven to eight times in the past month, which she attributes to her irregular menstrual cycle. She also has a prescription for Ativan, which she uses sparingly for severe panic attacks, and has been prescribed Gemtesa by a urologist, which she has stopped taking due to side effects - she is not interested in managing right now.  The patient reports feeling  fatigued and has been having trouble falling and staying asleep in the past couple of months - she has a hard time stopping her mind from racing at night. She also reports irregular periods and is under the care of a gynecologist. She has undergone a colonoscopy earlier this year, which was normal, and is due for a diagnostic mammogram and ultrasound following a screening that revealed a spot - going next month.           Outpatient Encounter Medications as of 04/08/2023  Medication Sig   aspirin EC 81 MG tablet Take 81-325 mg by mouth every 6 (six) hours as needed for moderate pain. Swallow whole.   diclofenac Sodium (VOLTAREN ARTHRITIS PAIN) 1 % GEL Apply 2 g topically 4 (four) times daily. To left ankle   EPINEPHrine (EPIPEN 2-PAK) 0.3 mg/0.3 mL IJ SOAJ injection Inject 0.3 mg into the muscle as needed for anaphylaxis.   hydrOXYzine (ATARAX) 10 MG tablet Take 1-2 tablets (10-20 mg total) by mouth 3 (three) times daily as needed for anxiety.   ibuprofen (ADVIL) 200 MG tablet Take 200 mg by mouth every 6 (six) hours as needed for moderate pain.   LORazepam (ATIVAN) 1 MG tablet Take 1 tablet (1 mg total) by mouth every 8 (eight) hours as needed for anxiety.   meloxicam (MOBIC) 7.5 MG tablet Take 7.5 mg by mouth daily.   RESTASIS 0.05 % ophthalmic emulsion 1 drop 2 (two) times daily.   Rimegepant Sulfate (NURTEC) 75 MG TBDP Take 1 tablet (75 mg total) by mouth as needed (at onset of migraine). No more than 1 tablet in 24  hours   [DISCONTINUED] Vibegron (GEMTESA) 75 MG TABS Take 1 tablet (75 mg total) by mouth daily.   [DISCONTINUED] ondansetron (ZOFRAN) 4 MG tablet Take 1 tablet (4 mg total) by mouth daily as needed for nausea or vomiting.   No facility-administered encounter medications on file as of 04/08/2023.    Past Medical History:  Diagnosis Date   Allergy    Anemia    with pregnancy   Anxiety    due to condition   Arthritis    Cataract    Depression    GERD (gastroesophageal  reflux disease)    Headache(784.0)    history of   History of migraine headaches    Hyperlipidemia    PONV (postoperative nausea and vomiting)    with epidural anad 04/28/13 BP low and took longer to wake after surgery 04/28/2012    Past Surgical History:  Procedure Laterality Date   Broken ankle Right 2024   FRACTURE SURGERY     L femur   HARDWARE REMOVAL Left 11/01/2012   Procedure: HARDWARE REMOVAL;  Surgeon: Eldred Manges, MD;  [facility];  Service: Orthopedics;  Laterality: Left;  Left Ankle Removal Fibula Plate and 10 screws, Irrigation and Debridement   MANDIBLE FRACTURE SURGERY     ORIF ANKLE FRACTURE  04/28/2012   Procedure: OPEN REDUCTION INTERNAL FIXATION (ORIF) ANKLE FRACTURE;  Surgeon: Eldred Manges, MD;  Location: [facility] OR;  Service: Orthopedics;  Laterality: Left;  Open reduction internal fixation left ankle bimalleolar fracture    Family History  Problem Relation Age of Onset   Cancer Mother        lung, smoker   Multiple sclerosis Father    Pneumonia Father    Heart failure Maternal Grandmother    Cancer Maternal Grandfather        lung   Heart attack Maternal Grandfather        Died in his 44s   ALS Paternal Grandmother    Colon cancer Paternal Grandfather    Colon polyps Neg Hx    Esophageal cancer Neg Hx    Rectal cancer Neg Hx    Stomach cancer Neg Hx    Asthma Neg Hx     Social History   Socioeconomic History   Marital status: Married    Spouse name: Not on file   Number of children: Not on file   Years of education: Not on file   Highest education level: Not on file  Occupational History   Not on file  Tobacco Use   Smoking status: Every Day    Current packs/day: 0.50    Average packs/day: 0.7 packs/day for 59.8 years (39.9 ttl pk-yrs)    Types: Cigarettes    Start date: 50   Smokeless tobacco: Never  Vaping Use   Vaping status: Never Used  Substance and Sexual Activity   Alcohol use: Yes    Comment: Occasional   Drug use: Never    Sexual activity: Yes    Partners: Male    Birth control/protection: None  Other Topics Concern   Not on file  Social History Narrative   Right Handed   1 cup of Coffee per day   2 Cans of soda   Social Determinants of Health   Financial Resource Strain: Low Risk  (08/12/2022)   Overall Financial Resource Strain (CARDIA)    Difficulty of Paying Living Expenses: Not hard at all  Food Insecurity: No Food Insecurity (08/12/2022)   Hunger Vital Sign  Worried About Programme researcher, broadcasting/film/video in the Last Year: Never true    Ran Out of Food in the Last Year: Never true  Transportation Needs: No Transportation Needs (08/12/2022)   PRAPARE - Administrator, Civil Service (Medical): No    Lack of Transportation (Non-Medical): No  Physical Activity: Sufficiently Active (08/12/2022)   Exercise Vital Sign    Days of Exercise per Week: 7 days    Minutes of Exercise per Session: 60 min  Stress: Stress Concern Present (08/12/2022)   [employer] of Occupational Health - Occupational Stress Questionnaire    Feeling of Stress : Very much  Social Connections: Moderately Isolated (08/12/2022)   Social Connection and Isolation Panel [NHANES]    Frequency of Communication with Friends and Family: More than three times a week    Frequency of Social Gatherings with Friends and Family: More than three times a week    Attends Religious Services: Never    Database administrator or Organizations: No    Attends Banker Meetings: Never    Marital Status: Married  Catering manager Violence: Not At Risk (08/12/2022)   Humiliation, Afraid, Rape, and Kick questionnaire    Fear of Current or Ex-Partner: No    Emotionally Abused: No    Physically Abused: No    Sexually Abused: No    ROS All review of systems negative except what is listed in the HPI      Objective    BP 119/86 (BP Location: Right Arm, Patient Position: Sitting, Cuff Size: Normal)   Pulse 93   Temp 97.6 F (36.4  C) (Oral)   Resp 18   Ht 5\' 3"  (1.6 m)   Wt 136 lb 3.2 oz (61.8 kg)   LMP 03/26/2023 (Exact Date)   SpO2 99%   BMI 24.13 kg/m   Physical Exam Vitals reviewed.  Constitutional:      Appearance: Normal appearance.  Cardiovascular:     Rate and Rhythm: Normal rate and regular rhythm.     Heart sounds: Normal heart sounds.  Pulmonary:     Effort: Pulmonary effort is normal.     Breath sounds: Normal breath sounds.  Skin:    General: Skin is warm and dry.  Neurological:     Mental Status: She is alert and oriented to person, place, and time.  Psychiatric:        Mood and Affect: Mood normal.        Behavior: Behavior normal.        Thought Content: Thought content normal.        Judgment: Judgment normal.           Assessment & Plan:   Problem List Items Addressed This Visit       Active Problems   Tobacco abuse (Chronic)    Half pack per day since age 79. -Encourage smoking cessation. -Refer to lung cancer screening program for annual low-dose CT scan.      Relevant Orders   Ambulatory Referral Lung Cancer Screening [facility]   Anxiety    Reports of occasional panic attacks and difficulty sleeping due to anxiety. Currently using lorazepam sparingly for severe panic attacks. -Perform drug screen today. If results are clean, refill a small supply of lorazepam. -Prescribe hydroxyzine for use at night to help with sleep and anxiety. Encourage use of hydroxyzine over lorazepam when possible. -Continue weekly counseling sessions.      Relevant Medications   hydrOXYzine (ATARAX) 10  MG tablet   Chronic migraine w/o aura, not intractable, w/o stat migr    Continue following with Neuro PRN Nurtec Supportive/preventative measures encouraged.      Relevant Medications   EPINEPHrine (EPIPEN 2-PAK) 0.3 mg/0.3 mL IJ SOAJ injection   Anemia    History of anemia, possibly related to iron deficiency. Reports of recent fatigue. -Order blood counts and iron  studies today to assess for anemia.      High risk medication use    Patient requesting PRN lorazepam Contract today UDS today - refill pending results       Relevant Orders   DRUG MONITORING, PANEL 8 WITH CONFIRMATION, URINE   H/O allergy to shellfish    Advised avoidance of allergens EpiPen provided       Relevant Medications   EPINEPHrine (EPIPEN 2-PAK) 0.3 mg/0.3 mL IJ SOAJ injection   Other Visit Diagnoses     Encounter for medical examination to establish care    -  Primary   Fatigue, unspecified type       Relevant Orders   CBC with Differential/Platelet   B12 and Folate Panel   IBC + Ferritin   TSH       Return in about 6 months (around 10/07/2023) for routine follow-up.   Clayborne Dana, NP

## 2023-04-08 NOTE — Patient Instructions (Signed)
Thank you for choosing [facility] at [facility] for your Primary Care needs. I am excited for the opportunity to partner with you to meet your health care goals. It was a pleasure meeting you today!  Information on diet, exercise, and health maintenance recommendations are listed below. This is information to help you be sure you are on track for optimal health and monitoring.   Please look over this and let us know if you have any questions or if you have completed any of the health maintenance outside of [facility] so that we can be sure your records are up to date.  ___________________________________________________________  MyChart:  For all urgent or time sensitive needs we ask that you please call the office to avoid delays. Our number is [phone]. MyChart is not constantly monitored and due to the large volume of messages a day, replies may take up to 72 business hours.  MyChart Policy: MyChart allows for you to see your visit notes, after visit summary, provider recommendations, lab and tests results, make an appointment, request refills, and contact your provider or the office for non-urgent questions or concerns. Providers are seeing patients during normal business hours and do not have built in time to review MyChart messages.  We ask that you allow a minimum of 3 business days for responses to [employer]. For this reason, please do not send urgent requests through MyChart. Please call the office at [phone]. New and ongoing conditions may require a visit. We have virtual and in-person visits available for your convenience.  Complex MyChart concerns may require a visit. Your provider may request you schedule a virtual or in-person visit to ensure we are providing the best care possible. MyChart messages sent after 11:00 AM on Friday will not be received by the provider until Monday morning.    Lab and Test Results: You will receive your lab and test  results on MyChart as soon as they are completed and results have been sent by the lab or testing facility. Due to this service, you will receive your results BEFORE your provider.  I review lab and test results each morning prior to seeing patients. Some results require collaboration with other providers to ensure you are receiving the most appropriate care. For this reason, we ask that you please allow a minimum of 3-5 business days from the time that ALL results have been received for your provider to receive and review lab and test results and contact you about these.  Most lab and test result comments from the provider will be sent through MyChart. Your provider may recommend changes to the plan of care, follow-up visits, repeat testing, ask questions, or request an office visit to discuss these results. You may reply directly to this message or call the office to provide information for the provider or set up an appointment. In some instances, you will be called with test results and recommendations. Please let us know if this is preferred and we will make note of this in your chart to provide this for you.    If you have not heard a response to your lab or test results in 5 business days from all results returning to MyChart, please call the office to let us know. We ask that you please avoid calling prior to this time unless there is an emergent concern. Due to high call volumes, this can delay the resulting process.  After Hours: For all non-emergency after hours needs, please  call the office at [phone] and select the option to reach the on-call  service. On-call services are shared between multiple [facility] offices and therefore it will not be possible to speak directly with your provider. On-call providers may provide medical advice and recommendations, but are unable to provide refills for maintenance medications.  For all emergency or urgent medical needs after normal business hours, we  recommend that you seek care at the closest Urgent Care or Emergency Department to ensure appropriate treatment in a timely manner.  [facility] has a 24 hour emergency room located on the ground floor for your convenience.   Urgent Concerns During the Business Day Providers are seeing patients from 8AM to 5PM with a busy schedule and are most often not able to respond to non-urgent calls until the end of the day or the next business day. If you should have URGENT concerns during the day, please call and speak to the nurse or schedule a same day appointment so that we can address your concern without delay.   Thank you, again, for choosing me as your health care partner. I appreciate your trust and look forward to learning more about you!   Lollie Marrow Reola Calkins, DNP, FNP-C  ___________________________________________________________  Health Maintenance Recommendations Screening Testing Mammogram Every 1-2 years based on history and risk factors Starting at age 59 Pap Smear Ages 21-39 every 3 years Ages 77-65 every 5 years with HPV testing More frequent testing may be required based on results and history Colon Cancer Screening Every 1-10 years based on test performed, risk factors, and history Starting at age 24 Bone Density Screening Every 2-10 years based on history Starting at age 51 for women Recommendations for men differ based on medication usage, history, and risk factors AAA Screening One time ultrasound Men 90-69 years old who have ever smoked Lung Cancer Screening Low Dose Lung CT every 12 months Age 79-80 years with a 20 pack-year smoking history who still smoke or who have quit within the last 15 years  Screening Labs Routine  Labs: Complete Blood Count (CBC), Complete Metabolic Panel (CMP), Cholesterol (Lipid Panel) Every 6-12 months based on history and medications May be recommended more frequently based on current conditions or previous results Hemoglobin  A1c Lab Every 3-12 months based on history and previous results Starting at age 53 or earlier with diagnosis of diabetes, high cholesterol, BMI >26, and/or risk factors Frequent monitoring for patients with diabetes to ensure blood sugar control Thyroid Panel  Every 6 months based on history, symptoms, and risk factors May be repeated more often if on medication HIV One time testing for all patients 45 and older May be repeated more frequently for patients with increased risk factors or exposure Hepatitis C One time testing for all patients 62 and older May be repeated more frequently for patients with increased risk factors or exposure Gonorrhea, Chlamydia Every 12 months for all sexually active persons 13-24 years Additional monitoring may be recommended for those who are considered high risk or who have symptoms PSA Men 80-87 years old with risk factors Additional screening may be recommended from age 61-69 based on risk factors, symptoms, and history  Vaccine Recommendations Tetanus Booster All adults every 10 years Flu Vaccine All patients 6 months and older every year COVID Vaccine All patients 12 years and older Initial dosing with booster May recommend additional booster based on age and health history HPV Vaccine 2 doses all patients age 59-26 Dosing may be considered  for patients over 26 Shingles Vaccine (Shingrix) 2 doses all adults 50 years and older Pneumonia (Pneumovax 23) All adults 65 years and older May recommend earlier dosing based on health history Pneumonia (Prevnar 71) All adults 65 years and older Dosed 1 year after Pneumovax 23 Pneumonia (Prevnar 20) All adults 65 years and older (adults 19-64 with certain conditions or risk factors) 1 dose  For those who have not received Prevnar 13 vaccine previously   Additional Screening, Testing, and Vaccinations may be recommended on an individualized basis based on family history, health history, risk  factors, and/or exposure.  __________________________________________________________  Diet Recommendations for All Patients  I recommend that all patients maintain a diet low in saturated fats, carbohydrates, and cholesterol. While this can be challenging at first, it is not impossible and small changes can make big differences.  Things to try: Decreasing the amount of soda, sweet tea, and/or juice to one or less per day and replace with water While water is always the first choice, if you do not like water you may consider adding a water additive without sugar to improve the taste other sugar free drinks Replace potatoes with a brightly colored vegetable  Use healthy oils, such as canola oil or olive oil, instead of butter or hard margarine Limit your bread intake to two pieces or less a day Replace regular pasta with low carb pasta options Bake, broil, or grill foods instead of frying Monitor portion sizes  Eat smaller, more frequent meals throughout the day instead of large meals  An important thing to remember is, if you love foods that are not great for your health, you don't have to give them up completely. Instead, allow these foods to be a reward when you have done well. Allowing yourself to still have special treats every once in a while is a nice way to tell yourself thank you for working hard to keep yourself healthy.   Also remember that every day is a new day. If you have a bad day and "fall off the wagon", you can still climb right back up and keep moving along on your journey!  We have resources available to help you!  Some websites that may be helpful include: www.http://www.wall-moore.info/  Www.VeryWellFit.com _____________________________________________________________  Activity Recommendations for All Patients  I recommend that all adults get at least 30 minutes of moderate physical activity that elevates your heart rate at least 5 days out of the week.  Some examples  include: Walking or jogging at a pace that allows you to carry on a conversation Cycling (stationary bike or outdoors) Water aerobics Yoga Weight lifting Dancing If physical limitations prevent you from putting stress on your joints, exercise in a pool or seated in a chair are excellent options.  Do determine your MAXIMUM heart rate for activity: 220 - YOUR AGE = MAX Heart Rate   Remember! Do not push yourself too hard.  Start slowly and build up your pace, speed, weight, time in exercise, etc.  Allow your body to rest between exercise and get good sleep. You will need more water than normal when you are exerting yourself. Do not wait until you are thirsty to drink. Drink with a purpose of getting in at least 8, 8 ounce glasses of water a day plus more depending on how much you exercise and sweat.    If you begin to develop dizziness, chest pain, abdominal pain, jaw pain, shortness of breath, headache, vision changes, lightheadedness, or other concerning symptoms,  stop the activity and allow your body to rest. If your symptoms are severe, seek emergency evaluation immediately. If your symptoms are concerning, but not severe, please let us know so that we can recommend further evaluation.

## 2023-04-08 NOTE — Assessment & Plan Note (Signed)
Advised avoidance of allergens EpiPen provided

## 2023-04-08 NOTE — Assessment & Plan Note (Signed)
Continue following with Neuro PRN Nurtec Supportive/preventative measures encouraged.

## 2023-04-08 NOTE — Assessment & Plan Note (Signed)
History of anemia, possibly related to iron deficiency. Reports of recent fatigue. -Order blood counts and iron studies today to assess for anemia.

## 2023-04-08 NOTE — Assessment & Plan Note (Signed)
Half pack per day since age 52. -Encourage smoking cessation. -Refer to lung cancer screening program for annual low-dose CT scan.

## 2023-04-08 NOTE — Assessment & Plan Note (Signed)
Reports of occasional panic attacks and difficulty sleeping due to anxiety. Currently using lorazepam sparingly for severe panic attacks. -Perform drug screen today. If results are clean, refill a small supply of lorazepam. -Prescribe hydroxyzine for use at night to help with sleep and anxiety. Encourage use of hydroxyzine over lorazepam when possible. -Continue weekly counseling sessions.

## 2023-04-08 NOTE — Assessment & Plan Note (Signed)
Patient requesting PRN lorazepam Contract today UDS today - refill pending results

## 2023-04-09 ENCOUNTER — Other Ambulatory Visit: Payer: Self-pay | Admitting: Family Medicine

## 2023-04-09 DIAGNOSIS — F419 Anxiety disorder, unspecified: Secondary | ICD-10-CM

## 2023-04-09 LAB — DRUG MONITORING, PANEL 8 WITH CONFIRMATION, URINE
6 Acetylmorphine: NEGATIVE ng/mL (ref <10)
Alcohol Metabolites: NEGATIVE ng/mL (ref <500)
Amphetamines: NEGATIVE ng/mL (ref <500)
Benzodiazepines: NEGATIVE ng/mL (ref <100)
Buprenorphine, Urine: NEGATIVE ng/mL (ref <5)
Cocaine Metabolite: NEGATIVE ng/mL (ref <150)
Creatinine: 35.4 mg/dL (ref >=20.0)
MDMA: NEGATIVE ng/mL (ref <500)
Marijuana Metabolite: NEGATIVE ng/mL (ref <20)
Opiates: NEGATIVE ng/mL (ref <100)
Oxidant: NEGATIVE ug/mL (ref <200)
Oxycodone: NEGATIVE ng/mL (ref <100)
pH: 5.4 (ref 4.5–9.0)

## 2023-04-09 LAB — DM TEMPLATE

## 2023-04-09 MED ORDER — LORAZEPAM 1 MG PO TABS
1.0000 mg | ORAL_TABLET | Freq: Two times a day (BID) | ORAL | 0 refills | Status: DC | PRN
Start: 2023-04-09 — End: 2023-08-04

## 2023-05-05 ENCOUNTER — Ambulatory Visit (HOSPITAL_COMMUNITY)
Admission: RE | Admit: 2023-05-05 | Discharge: 2023-05-05 | Disposition: A | Discharge status: Home / Self Care | Payer: 59 | Source: Ambulatory Visit | Attending: Family Medicine | Admitting: Family Medicine

## 2023-05-05 ENCOUNTER — Encounter (HOSPITAL_COMMUNITY): Payer: Self-pay

## 2023-05-05 DIAGNOSIS — N6320 Unspecified lump in the left breast, unspecified quadrant: Secondary | ICD-10-CM | POA: Diagnosis present

## 2023-05-06 ENCOUNTER — Ambulatory Visit: Payer: 59 | Admitting: Family Medicine

## 2023-05-06 ENCOUNTER — Telehealth: Payer: Self-pay | Admitting: Student

## 2023-05-06 ENCOUNTER — Other Ambulatory Visit (HOSPITAL_COMMUNITY)
Admission: RE | Admit: 2023-05-06 | Discharge: 2023-05-06 | Disposition: A | Discharge status: Home / Self Care | Payer: 59 | Source: Ambulatory Visit | Attending: Family Medicine | Admitting: Family Medicine

## 2023-05-06 ENCOUNTER — Other Ambulatory Visit: Payer: Self-pay

## 2023-05-06 ENCOUNTER — Other Ambulatory Visit (HOSPITAL_COMMUNITY): Payer: Self-pay | Admitting: Family Medicine

## 2023-05-06 ENCOUNTER — Encounter: Payer: Self-pay | Admitting: Family Medicine

## 2023-05-06 VITALS — Ht 64.0 in | Wt 140.0 lb

## 2023-05-06 DIAGNOSIS — R87610 Atypical squamous cells of undetermined significance on cytologic smear of cervix (ASC-US): Secondary | ICD-10-CM | POA: Diagnosis not present

## 2023-05-06 DIAGNOSIS — Z3202 Encounter for pregnancy test, result negative: Secondary | ICD-10-CM

## 2023-05-06 DIAGNOSIS — R921 Mammographic calcification found on diagnostic imaging of breast: Secondary | ICD-10-CM

## 2023-05-06 LAB — POCT URINE PREGNANCY: Preg Test, Ur: NEGATIVE

## 2023-05-06 NOTE — Progress Notes (Signed)
Patient Name: Brianna Bowman, female   DOB: 09-26-1970, 52 y.o.  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    Exam: Vulva and Vagina grossly normal.  Cervix viewed with speculum and colposcope after application of acetic acid:  Cervix Fully Visualized Squamocolumnar Junction Visibility: Fully visualized  Acetowhite lesions: 12 o'clock  Other Lesions:  nabothian cysts at 7 o'clock and 11 o'clock Punctation: Fine  Mosaicism: Not present Abnormal vasculature: No   Biopsies: 12 o'clock ECC: Yes - Curette and Brush  Hemostasis achieved with:  Silver Nitrate  Colposcopy Impression:  CIN1   Patient was given post procedure instructions.  Will call patient with results.

## 2023-05-06 NOTE — Progress Notes (Signed)
Orders already entered

## 2023-05-06 NOTE — Telephone Encounter (Signed)
Called patient to notify of her breast imaging results. Patient is aware and she is aware of her appointment to go back on 11/15 for biopsy.

## 2023-05-06 NOTE — Telephone Encounter (Signed)
-----   Message from Tyson Alias sent at 05/06/2023  7:46 AM EST -----  ----- Message ----- From: Leory Plowman, Rad Results In Sent: 05/05/2023   5:05 PM EST To: Tyson Alias, MD

## 2023-05-07 LAB — SURGICAL PATHOLOGY

## 2023-05-08 ENCOUNTER — Ambulatory Visit
Admission: RE | Admit: 2023-05-08 | Discharge: 2023-05-08 | Disposition: A | Discharge status: Home / Self Care | Payer: 59 | Source: Ambulatory Visit | Attending: Family Medicine | Admitting: Family Medicine

## 2023-05-08 DIAGNOSIS — R921 Mammographic calcification found on diagnostic imaging of breast: Secondary | ICD-10-CM | POA: Diagnosis present

## 2023-05-08 DIAGNOSIS — N6012 Diffuse cystic mastopathy of left breast: Secondary | ICD-10-CM | POA: Insufficient documentation

## 2023-05-08 HISTORY — PX: BREAST BIOPSY: SHX20

## 2023-05-08 MED ORDER — LIDOCAINE-EPINEPHRINE 1 %-1:100000 IJ SOLN
10.0000 mL | Freq: Once | INTRAMUSCULAR | Status: AC
  Administered 2023-05-08: 10 mL
  Filled 2023-05-08: qty 10

## 2023-05-08 MED ORDER — LIDOCAINE 1 % OPTIME INJ - NO CHARGE
5.0000 mL | Freq: Once | INTRAMUSCULAR | Status: AC
  Administered 2023-05-08: 5 mL
  Filled 2023-05-08: qty 6

## 2023-05-11 LAB — SURGICAL PATHOLOGY

## 2023-05-14 ENCOUNTER — Ambulatory Visit: Payer: 59 | Admitting: Urology

## 2023-05-28 ENCOUNTER — Ambulatory Visit: Payer: 59 | Admitting: Neurology

## 2023-05-28 ENCOUNTER — Encounter: Payer: Self-pay | Admitting: Neurology

## 2023-05-28 VITALS — BP 113/77 | HR 82 | Ht 63.0 in | Wt 137.5 lb

## 2023-05-28 DIAGNOSIS — R9082 White matter disease, unspecified: Secondary | ICD-10-CM

## 2023-05-28 DIAGNOSIS — G3180 Leukodystrophy, unspecified: Secondary | ICD-10-CM

## 2023-05-28 DIAGNOSIS — R42 Dizziness and giddiness: Secondary | ICD-10-CM | POA: Diagnosis not present

## 2023-05-28 DIAGNOSIS — Z720 Tobacco use: Secondary | ICD-10-CM

## 2023-05-28 DIAGNOSIS — G43709 Chronic migraine without aura, not intractable, without status migrainosus: Secondary | ICD-10-CM

## 2023-05-28 DIAGNOSIS — H539 Unspecified visual disturbance: Secondary | ICD-10-CM

## 2023-05-28 DIAGNOSIS — F419 Anxiety disorder, unspecified: Secondary | ICD-10-CM

## 2023-05-28 MED ORDER — BUSPIRONE HCL 15 MG PO TABS: 15.0000 mg | ORAL_TABLET | Freq: Two times a day (BID) | ORAL | 5 refills | Status: DC

## 2023-05-28 NOTE — Progress Notes (Signed)
[facility]  PATIENT: Brianna Bowman DOB: [birth year]  REFERRING DOCTOR OR PCP: Modena Slater DO; Dickie La, MD SOURCE: Patient, emergency room notes, imaging and lab reports, MRI images personally reviewed.  _________________________________   HISTORICAL  CHIEF COMPLAINT:  Chief Complaint  Patient presents with   Follow-up    Pt in room 10, husband Adam in room. Here for migraine follow up. States migraines are okay, taking Nurtec as needed. Averages about 6-7 per month. Patient has noticed increased anxiety.     HISTORY OF PRESENT ILLNESS:  Brianna Bowman is a 52 y.o.  headaches and abnormal brain MRI.  Update 05/28/2023: She is experiencing about 5-8 migraines a month.  When 1 occurs, Nurtec kicks in quickly the majority of the time and she only has 1-2 headaches a month that are difficult to treat.    She is having more dizzy spells feeling like she is seasick.  .  This may happen with just rolling over in bed but can occur in any position.  Spells may last a few minutes   Austin Miles exercises had not helped.   She had a negative Dix-Hallpike with these symptoms last visit.    She was going to have cataract surgery this week but due to insurance issues this will be delayed.     The MRI is unusual showing occipital and parietal white matter changes.  Unfortunately she does not have any earlier MRI for comparison.  CT scan in 2012 did not show any white matter changes.  These are somewhat symmetric.  They are not typical for MS or CADASIL.  A pattern like this could be seen with press.  Of note, in 2007, she reportedly had a reaction to anesthetics during delivery and her temperature shot up to 105.  She does not recall being told that blood pressure was elevated or that she had eclampsia.  She had normal developmental milestones.  Migraine history She presented to the emergency room 09/11/2022 with central left visual field changes and headache for the preceding  6 days.    Since discharge, she continue to have headache.  A couple days later she started to note positional   dizziness (vertigo > lightheaded) is worse.   When she stands she feels dizzy.    She also feels vertigo that worsens if she stands and sometimes rolling over in bed.   Of note, several years ago she had similar visual changes on the right but did not seek medical attention.   She did not have other symptoms at that time.     She has migraines 3-4 times a month. With migraines, she has Nausea, photo and phonophobia.    Pain is pounding.  When one occurs she sometimes takes [place] with benefit.   Sumatriptan had not helped.    Cymbalta and NSAIDs have not helped prevent the migraines  She just saw Dr. Dione Booze of ophthalmology and she was diagnosed with cataracts but was told no internal eye abnormality.      Her father had MS and PGM had ALS.   Vascular risk:   She smokes.   BP is usually normal or low but never high.     Imaging: MRI of the brain with and without 09/11/2022 shows extensive fairly symmetric T2/FLAIR hyperintense foci predominantly in the subcortical and deep white matter of the cerebral hemispheres.  No callososeptal or infratentorial foci were noted.  Normal enhancement pattern.  MRI of the orbits 09/11/2022 was normal.  Laboratory: Urinalysis and culture showed E. coli UTI.  CMP, CBC and differential were normal.    REVIEW OF SYSTEMS: Constitutional: No fevers, chills, sweats, or change in appetite Eyes: No visual changes, double vision, eye pain Ear, nose and throat: No hearing loss, ear pain, nasal congestion, sore throat Cardiovascular: No chest pain, palpitations Respiratory:  No shortness of breath at rest or with exertion.   No wheezes GastrointestinaI: No nausea, vomiting, diarrhea, abdominal pain, fecal incontinence Genitourinary:  No dysuria, urinary retention or frequency.  No nocturia. Musculoskeletal:  No neck pain, back pain.  She currently has an  ankle fracture on the right and had 1 on the left a few years ago. Integumentary: No rash, pruritus, skin lesions Neurological: as above Psychiatric: No depression at this time.  Has some anxiety.  This is worsened over the last 2 weeks. Endocrine: No palpitations, diaphoresis, change in appetite, change in weigh or increased thirst Hematologic/Lymphatic:  No anemia, purpura, petechiae. Allergic/Immunologic: No itchy/runny eyes, nasal congestion, recent allergic reactions, rashes  ALLERGIES: Allergies  Allergen Reactions   Amoxicillin Anaphylaxis    Has patient had a PCN reaction causing immediate rash, facial/tongue/throat swelling, SOB or lightheadedness with hypotension: Yes Has patient had a PCN reaction causing severe rash involving mucus membranes or skin necrosis: No Has patient had a PCN reaction that required hospitalization: No Has patient had a PCN reaction occurring within the last 10 years: Yes If all of the above answers are "NO", then may proceed with Cephalosporin use.    Adhesive [Tape] Hives   Aloe Hives   Rizatriptan     Heart palpitations, "jaw line felt like it was seizing up", weak, uneasy.    Shellfish Allergy Rash    Lobster    HOME MEDICATIONS:  Current Outpatient Medications:    aspirin EC 81 MG tablet, Take 81-325 mg by mouth every 6 (six) hours as needed for moderate pain. Swallow whole., Disp: , Rfl:    busPIRone (BUSPAR) 15 MG tablet, Take 1 tablet (15 mg total) by mouth 2 (two) times daily., Disp: 60 tablet, Rfl: 5   diclofenac Sodium (VOLTAREN ARTHRITIS PAIN) 1 % GEL, Apply 2 g topically 4 (four) times daily. To left ankle, Disp: 4 g, Rfl: 3   EPINEPHrine (EPIPEN 2-PAK) 0.3 mg/0.3 mL IJ SOAJ injection, Inject 0.3 mg into the muscle as needed for anaphylaxis., Disp: 1 each, Rfl: 2   ibuprofen (ADVIL) 200 MG tablet, Take 200 mg by mouth every 6 (six) hours as needed for moderate pain., Disp: , Rfl:    LORazepam (ATIVAN) 1 MG tablet, Take 1 tablet (1 mg  total) by mouth 2 (two) times daily as needed for anxiety., Disp: 20 tablet, Rfl: 0   meloxicam (MOBIC) 7.5 MG tablet, Take 7.5 mg by mouth daily., Disp: , Rfl:    RESTASIS 0.05 % ophthalmic emulsion, 1 drop 2 (two) times daily., Disp: , Rfl:    Rimegepant Sulfate (NURTEC) 75 MG TBDP, Take 1 tablet (75 mg total) by mouth as needed (at onset of migraine). No more than 1 tablet in 24 hours, Disp: 16 tablet, Rfl: 5   hydrOXYzine (ATARAX) 10 MG tablet, Take 1-2 tablets (10-20 mg total) by mouth 3 (three) times daily as needed for anxiety. (Patient not taking: Reported on 05/28/2023), Disp: 30 tablet, Rfl: 0  PAST MEDICAL HISTORY: Past Medical History:  Diagnosis Date   Allergy    Anemia    with pregnancy   Anxiety    due to condition  Arthritis    Cataract    Depression    GERD (gastroesophageal reflux disease)    Headache(784.0)    history of   History of migraine headaches    Hyperlipidemia    PONV (postoperative nausea and vomiting)    with epidural anad 04/28/13 BP low and took longer to wake after surgery 04/28/2012    PAST SURGICAL HISTORY: Past Surgical History:  Procedure Laterality Date   BREAST BIOPSY Left 05/08/2023   stereo bx, calcs, COIL clip-path pending   BREAST BIOPSY Left 05/08/2023   stereo bx, calcs, RIBBON clip-path pending   BREAST BIOPSY Left 05/08/2023   MM LT BREAST BX W LOC DEV 1ST LESION IMAGE BX SPEC STEREO GUIDE 05/08/2023 [facility]   BREAST BIOPSY Left 05/08/2023   MM LT BREAST BX W LOC DEV EA AD LESION IMG BX SPEC STEREO GUIDE 05/08/2023 [facility]   Broken ankle Right 2024   FRACTURE SURGERY     L femur   HARDWARE REMOVAL Left 11/01/2012   Procedure: HARDWARE REMOVAL;  Surgeon: Eldred Manges, MD;  Location: [facility] OR;  Service: Orthopedics;  Laterality: Left;  Left Ankle Removal Fibula Plate and 10 screws, Irrigation and Debridement   MANDIBLE FRACTURE SURGERY     ORIF ANKLE FRACTURE  04/28/2012   Procedure: OPEN REDUCTION INTERNAL  FIXATION (ORIF) ANKLE FRACTURE;  Surgeon: Eldred Manges, MD;  Location: [facility] OR;  Service: Orthopedics;  Laterality: Left;  Open reduction internal fixation left ankle bimalleolar fracture    FAMILY HISTORY: Family History  Problem Relation Age of Onset   Cancer Mother        lung, smoker   Multiple sclerosis Father    Pneumonia Father    Heart failure Maternal Grandmother    Cancer Maternal Grandfather        lung   Heart attack Maternal Grandfather        Died in his 48s   ALS Paternal Grandmother    Colon cancer Paternal Grandfather    Colon polyps Neg Hx    Esophageal cancer Neg Hx    Rectal cancer Neg Hx    Stomach cancer Neg Hx    Asthma Neg Hx     SOCIAL HISTORY: Social History   Socioeconomic History   Marital status: Married    Spouse name: Not on file   Number of children: Not on file   Years of education: Not on file   Highest education level: Not on file  Occupational History   Not on file  Tobacco Use   Smoking status: Every Day    Current packs/day: 0.50    Average packs/day: 0.7 packs/day for 59.9 years (40.0 ttl pk-yrs)    Types: Cigarettes    Start date: 70   Smokeless tobacco: Never  Vaping Use   Vaping status: Never Used  Substance and Sexual Activity   Alcohol use: Yes    Comment: Occasional   Drug use: Never   Sexual activity: Yes    Partners: Male    Birth control/protection: None  Other Topics Concern   Not on file  Social History Narrative   Right Handed   1 cup of Coffee per day   2 Cans of soda   Social Determinants of Health   Financial Resource Strain: Low Risk  (08/12/2022)   Overall Financial Resource Strain (CARDIA)    Difficulty of Paying Living Expenses: Not hard at all  Food Insecurity: No Food Insecurity (08/12/2022)   Hunger Vital Sign  Worried About Programme researcher, broadcasting/film/video in the Last Year: Never true    Ran Out of Food in the Last Year: Never true  Transportation Needs: No Transportation Needs (08/12/2022)   PRAPARE -  Administrator, Civil Service (Medical): No    Lack of Transportation (Non-Medical): No  Physical Activity: Sufficiently Active (08/12/2022)   Exercise Vital Sign    Days of Exercise per Week: 7 days    Minutes of Exercise per Session: 60 min  Stress: Stress Concern Present (08/12/2022)   [employer] of Occupational Health - Occupational Stress Questionnaire    Feeling of Stress : Very much  Social Connections: Moderately Isolated (08/12/2022)   Social Connection and Isolation Panel [NHANES]    Frequency of Communication with Friends and Family: More than three times a week    Frequency of Social Gatherings with Friends and Family: More than three times a week    Attends Religious Services: Never    Database administrator or Organizations: No    Attends Banker Meetings: Never    Marital Status: Married  Catering manager Violence: Not At Risk (08/12/2022)   Humiliation, Afraid, Rape, and Kick questionnaire    Fear of Current or Ex-Partner: No    Emotionally Abused: No    Physically Abused: No    Sexually Abused: No       PHYSICAL EXAM  Vitals:   05/28/23 1438  BP: 113/77  Pulse: 82  Weight: 137 lb 8 oz (62.4 kg)  Height: 5\' 3"  (1.6 m)    Body mass index is 24.36 kg/m.   General: The patient is well-developed and well-nourished and in no acute distress  HEENT:  Head is [place]/AT.  Sclera are anicteric.    Neck: No carotid bruits are noted.  The neck is nontender.  Cardiovascular: The heart has a regular rate and rhythm with a normal S1 and S2. There were no murmurs, gallops or rubs.    Skin: Extremities are without rash or  edema.  Musculoskeletal/extremities: Right ankle was casted\  Neurologic Exam  Mental status: The patient is alert and oriented x 3 at the time of the examination. The patient has apparent normal recent and remote memory, with an apparently normal attention span and concentration ability.   Speech is normal.  Cranial  nerves: Extraocular movements are full. Pupils are equal, round, and reactive to light and accomodation.  Visual fields are full.  Facial symmetry is present. There is good facial sensation to soft touch bilaterally.Facial strength is normal.  Trapezius and sternocleidomastoid strength is normal. No dysarthria is noted.  The tongue is midline, and the patient has symmetric elevation of the soft palate. No obvious hearing deficits are noted.  Weber sign did not localize.  Motor:  Muscle bulk is normal.   Tone is normal. Strength is  5 / 5 in all 4 extremities.   Sensory: Sensory testing is intact to pinprick, soft touch and vibration sensation in all 4 extremities.  Coordination: Cerebellar testing reveals good finger-nose-finger and heel-to-shin bilaterally.  Gait and station: Station is normal.   Gait is normal. Tandem gait is normal. Romberg is negative.   Reflexes: Deep tendon reflexes are symmetric and normal bilaterally (right leg not tested due to fracture).   Plantar responses are flexor.  Dix-Hallpike maneuver did not elicit nystagmus or vertigo.    DIAGNOSTIC DATA (LABS, IMAGING, TESTING) - I reviewed patient records, labs, notes, testing and imaging myself where available.  Lab  Results  Component Value Date   WBC 6.2 04/08/2023   HGB 15.0 04/08/2023   HCT 45.4 04/08/2023   MCV 96.2 04/08/2023   PLT 301.0 04/08/2023      Component Value Date/Time   NA 140 09/11/2022 1228   NA 138 08/12/2022 1647   K 4.2 09/11/2022 1228   CL 105 09/11/2022 1228   CO2 25 09/11/2022 1130   GLUCOSE 89 09/11/2022 1228   BUN 7 09/11/2022 1228   BUN 10 08/12/2022 1647   CREATININE 0.70 09/11/2022 1228   CALCIUM 9.5 09/11/2022 1130   PROT 7.0 09/11/2022 1130   ALBUMIN 3.9 09/11/2022 1130   AST 24 09/11/2022 1130   ALT 25 09/11/2022 1130   ALKPHOS 57 09/11/2022 1130   BILITOT 0.5 09/11/2022 1130   GFRNONAA >60 09/11/2022 1130   GFRAA >60 02/10/2018 0305   Lab Results  Component Value  Date   CHOL 284 (H) 08/12/2022   HDL 38 (L) 08/12/2022   LDLCALC 211 (H) 08/12/2022   TRIG 180 (H) 08/12/2022   CHOLHDL 7.5 (H) 08/12/2022   Lab Results  Component Value Date   HGBA1C 5.6 08/12/2022   Lab Results  Component Value Date   VITAMINB12 294 04/08/2023   Lab Results  Component Value Date   TSH 2.37 04/08/2023       ASSESSMENT AND PLAN  White matter abnormality on MRI of brain - Plan: MR BRAIN/IAC W WO CONTRAST  Vertigo - Plan: MR BRAIN/IAC W WO CONTRAST  Leukodystrophy (HCC)  Chronic migraine w/o aura, not intractable, w/o stat migr  Tobacco abuse  Anxiety  Visual disturbance  Continue Nurtec as needed.  We discussed that if the frequency of the migraines increase we could consider an anti-CGRP injection or another prophylactic agent.  We will check an MRI of the brain for further evaluation to determine if there is progressive change Etiology of occipital and parietal white matter changes is unclear.  The pattern is not typical for MS.  It is possible that these are playing some role in her visual disturbance  BuSpar for anxiety. Return to see me in 1 year or sooner if there are new or worsening neurologic symptoms.   Jadence Kinlaw A. Epimenio Foot, MD, [facility] 05/28/2023, 5:10 PM Certified in Neurology, Clinical Neurophysiology, Sleep Medicine and Neuroimaging  [facility] [address], [address] [place], [place] [address] [phone]

## 2023-06-04 ENCOUNTER — Telehealth: Payer: Self-pay | Admitting: Neurology

## 2023-06-04 NOTE — Telephone Encounter (Signed)
Brianna Bowman: [id number] exp. 06/04/23-07/19/23 sent to [facility] [phone]

## 2023-06-05 ENCOUNTER — Other Ambulatory Visit (HOSPITAL_COMMUNITY): Payer: Self-pay

## 2023-06-05 ENCOUNTER — Telehealth: Payer: Self-pay

## 2023-06-05 NOTE — Telephone Encounter (Signed)
Pharmacy Patient Advocate Encounter   Received notification from Fax that prior authorization for Nurtec 75MG  dispersible tablets is required/requested.   Insurance verification completed.   The patient is insured through [facility] .   Per test claim: PA required; PA submitted to above mentioned insurance via CoverMyMeds Key/confirmation #/EOC [id number] Status is pending

## 2023-06-11 NOTE — Telephone Encounter (Signed)
Pharmacy Patient Advocate Encounter   Received notification from Fax that prior authorization for Nurtec 75MG  dispersible tablets is required/requested.   Insurance verification completed.   The patient is insured through [facility] .   Per test claim: PA required; PA submitted to above mentioned insurance via CoverMyMeds Key/confirmation #/EOC [id number] Status is pending  The previous PA submission expired-had to resubmit.

## 2023-06-14 ENCOUNTER — Ambulatory Visit (HOSPITAL_BASED_OUTPATIENT_CLINIC_OR_DEPARTMENT_OTHER)
Admission: RE | Admit: 2023-06-14 | Discharge: 2023-06-14 | Disposition: A | Discharge status: Home / Self Care | Payer: 59 | Source: Ambulatory Visit | Attending: Neurology | Admitting: Neurology

## 2023-06-14 DIAGNOSIS — R9082 White matter disease, unspecified: Secondary | ICD-10-CM | POA: Insufficient documentation

## 2023-06-14 DIAGNOSIS — R42 Dizziness and giddiness: Secondary | ICD-10-CM | POA: Insufficient documentation

## 2023-06-14 MED ORDER — GADOBUTROL 1 MMOL/ML IV SOLN
7.5000 mL | Freq: Once | INTRAVENOUS | Status: AC | PRN
  Administered 2023-06-14: 7.5 mL via INTRAVENOUS

## 2023-06-15 ENCOUNTER — Other Ambulatory Visit (HOSPITAL_COMMUNITY): Payer: Self-pay

## 2023-06-15 NOTE — Telephone Encounter (Signed)
Pharmacy Patient Advocate Encounter  Received notification from [facility] that Prior Authorization for Nurtec 75MG  dispersible tablets has been APPROVED from 06/13/2023 to 06/12/2024. Ran test claim, Copay is $0.00. This test claim was processed through [facility]- copay amounts may vary at other pharmacies due to pharmacy/plan contracts, or as the patient moves through the different stages of their insurance plan.   PA #/Case ID/Reference #: [id number]

## 2023-07-02 ENCOUNTER — Encounter: Payer: Self-pay | Admitting: *Deleted

## 2023-07-17 ENCOUNTER — Other Ambulatory Visit: Payer: Self-pay | Admitting: Emergency Medicine

## 2023-07-17 ENCOUNTER — Telehealth: Payer: Self-pay | Admitting: Acute Care

## 2023-07-17 DIAGNOSIS — Z87891 Personal history of nicotine dependence: Secondary | ICD-10-CM

## 2023-07-17 DIAGNOSIS — Z122 Encounter for screening for malignant neoplasm of respiratory organs: Secondary | ICD-10-CM

## 2023-07-17 DIAGNOSIS — F17210 Nicotine dependence, cigarettes, uncomplicated: Secondary | ICD-10-CM

## 2023-07-17 NOTE — Telephone Encounter (Signed)
Lung Cancer Screening Narrative/Criteria Questionnaire (Cigarette Smokers Only- No Cigars/Pipes/vapes)   Brianna Bowman   SDMV:07/28/2023 at 10:30am with Vernona Rieger Gleason PA     25-Jun-1970   LDCT: 07/29/2023 at 4:00pm at [facility]    53 y.o.   Phone: [phone]  Lung Screening Narrative (confirm age 71-77 yrs Medicare / 50-80 yrs Private pay insurance)   Insurance information:[employer]   Referring Provider:Taylor Beck NP   This screening involves an initial phone call with a team member from our program. It is called a shared decision making visit. The initial meeting is required by  insurance and Medicare to make sure you understand the program. This appointment takes about 15-20 minutes to complete. You will complete the screening scan at your scheduled date/time.  This scan takes about 5-10 minutes to complete. You can eat and drink normally before and after the scan.  Criteria questions for Lung Cancer Screening:   Are you a current or former smoker? Current Age began smoking: 53yo   If you are a former smoker, what year did you quit smoking? N/A(within 15 yrs)   To calculate your smoking history, I need an accurate estimate of how many packs of cigarettes you smoked per day and for how many years. (Not just the number of PPD you are now smoking)   Years smoking 38 x Packs per day 1 = Pack years 38   (at least 20 pack yrs)   (Make sure they understand that we need to know how much they have smoked in the past, not just the number of PPD they are smoking now)  Do you have a personal history of cancer?  No    Do you have a family history of cancer? Yes  (cancer type and and relative) Mother - Lung Cancer, PGF - Lung Cancer  Are you coughing up blood?  No  Have you had unexplained weight loss of 15 lbs or more in the last 6 months? No  It looks like you meet all criteria.  When would be a good time for [place] to schedule you for this screening?   Additional information: N/A

## 2023-07-28 ENCOUNTER — Ambulatory Visit: Payer: 59 | Admitting: Physician Assistant

## 2023-07-28 ENCOUNTER — Encounter: Payer: Self-pay | Admitting: Physician Assistant

## 2023-07-28 DIAGNOSIS — F17210 Nicotine dependence, cigarettes, uncomplicated: Secondary | ICD-10-CM | POA: Diagnosis not present

## 2023-07-28 NOTE — Patient Instructions (Signed)

## 2023-07-28 NOTE — Progress Notes (Signed)
 Virtual Visit via Telephone Note  I connected with Brianna Bowman on 07/28/23 at  1030 by telephone and verified that I am speaking with the correct person using two identifiers.  Location: Patient: home Provider: working virtually from home   I discussed the limitations, risks, security and privacy concerns of performing an evaluation and management service by telephone and the availability of in person appointments. I also discussed with the patient that there may be a patient responsible charge related to this service. The patient expressed understanding and agreed to proceed.     Shared Decision Making Visit Lung Cancer Screening Program [phone])   Eligibility: Age 53 Pack Years Smoking History Calculation 8 (# packs/per year x # years smoked) Recent History of coughing up blood  No Unexplained weight loss? No ( >Than 15 pounds within the last 6 months ) Prior History Lung / other cancer No (Diagnosis within the last 5 years already requiring surveillance chest CT Scans). Smoking Status Current Smoker  Visit Components: Discussion included one or more decision making aids. Yes Discussion included risk/benefits of screening. Yes Discussion included potential follow up diagnostic testing for abnormal scans. Yes Discussion included meaning and risk of over diagnosis. Yes Discussion included meaning and risk of False Positives. Yes Discussion included meaning of total radiation exposure. Yes  Counseling Included: Importance of adherence to annual lung cancer LDCT screening. Yes Impact of comorbidities on ability to participate in the program. Yes Ability and willingness to under diagnostic treatment: Yes  Smoking Cessation Counseling: Current Smokers:  Discussed importance of smoking cessation. Yes Information about tobacco cessation classes and interventions provided to patient. Yes Symptomatic Patient. No Diagnosis Code: Tobacco Use Z72.0 Asymptomatic Patient  Yes  Counseling (Intermediate counseling: > three minutes counseling) [id number] Information about tobacco cessation classes and interventions provided to patient. Yes Written Order for Lung Cancer Screening with LDCT placed in Epic. Yes (CT Chest Lung Cancer Screening Low Dose W/O CM) [id number] Z12.2-Screening of respiratory organs Z87.891-Personal history of nicotine  dependence   I have spent 25 minutes of face to face/ virtual visit  time with the patient discussing the risks and benefits of lung cancer screening. We discussed the above noted topics. We paused at intervals to allow for questions to be asked and answered to ensure understanding.We discussed that the single most powerful action that anyone can take to decrease their risk of developing lung cancer is to quit smoking.  We discussed options for tools to aid in quitting smoking including nicotine  replacement therapy, non-nicotine  medications, support groups, Quit Smart classes, and behavior modification. We discussed that often times setting smaller, more achievable goals, such as eliminating 1 cigarette a day for a week and then 2 cigarettes a day for a week can be helpful in slowly decreasing the number of cigarettes smoked. I provided  them  with smoking cessation  information  with contact information for community resources, classes, free nicotine  replacement therapy, and access to mobile apps, text messaging, and on-line smoking cessation help. I have also provided  them  the office contact information in the event they have any questions. We discussed the time and location of the scan, and that either Karna Curly RN, Karna Doom, RN  or I will call / send a letter with the results within 24-72 hours of receiving them. The patient verbalized understanding of all of  the above and had no further questions upon leaving the office. They have my contact information in the event they have any further questions.  I spent 3 minutes counseling  on smoking cessation and the health risks of continued tobacco abuse.  I explained to the patient that there has been a high incidence of coronary artery disease noted on these exams. I explained that this is a non-gated exam therefore degree or severity cannot be determined. This patient is not on statin therapy. I have asked the patient to follow-up with their PCP regarding any incidental finding of coronary artery disease and management with diet or medication as their PCP  feels is clinically indicated. The patient verbalized understanding of the above and had no further questions upon completion of the visit.    Leita SAUNDERS Amilya Haver, PA-C

## 2023-07-29 ENCOUNTER — Ambulatory Visit (HOSPITAL_BASED_OUTPATIENT_CLINIC_OR_DEPARTMENT_OTHER)
Admission: RE | Admit: 2023-07-29 | Discharge: 2023-07-29 | Disposition: A | Discharge status: Home / Self Care | Payer: 59 | Source: Ambulatory Visit | Attending: Acute Care | Admitting: Acute Care

## 2023-07-29 DIAGNOSIS — F17210 Nicotine dependence, cigarettes, uncomplicated: Secondary | ICD-10-CM | POA: Diagnosis present

## 2023-07-29 DIAGNOSIS — Z87891 Personal history of nicotine dependence: Secondary | ICD-10-CM | POA: Diagnosis present

## 2023-07-29 DIAGNOSIS — Z122 Encounter for screening for malignant neoplasm of respiratory organs: Secondary | ICD-10-CM | POA: Insufficient documentation

## 2023-08-04 ENCOUNTER — Encounter: Payer: Self-pay | Admitting: Family Medicine

## 2023-08-04 ENCOUNTER — Ambulatory Visit: Payer: 59 | Admitting: Family Medicine

## 2023-08-04 VITALS — BP 117/83 | HR 92 | Ht 63.0 in | Wt 139.0 lb

## 2023-08-04 DIAGNOSIS — Z720 Tobacco use: Secondary | ICD-10-CM | POA: Diagnosis not present

## 2023-08-04 DIAGNOSIS — M25572 Pain in left ankle and joints of left foot: Secondary | ICD-10-CM | POA: Diagnosis not present

## 2023-08-04 DIAGNOSIS — G8929 Other chronic pain: Secondary | ICD-10-CM

## 2023-08-04 DIAGNOSIS — F419 Anxiety disorder, unspecified: Secondary | ICD-10-CM | POA: Diagnosis not present

## 2023-08-04 MED ORDER — VARENICLINE TARTRATE (STARTER) 0.5 MG X 11 & 1 MG X 42 PO TBPK: ORAL_TABLET | ORAL | 0 refills | Status: DC

## 2023-08-04 MED ORDER — LORAZEPAM 1 MG PO TABS: 1.0000 mg | ORAL_TABLET | Freq: Two times a day (BID) | ORAL | 0 refills | Status: AC | PRN

## 2023-08-04 MED ORDER — MELOXICAM 7.5 MG PO TABS: 7.5000 mg | ORAL_TABLET | Freq: Every day | ORAL | 5 refills | Status: AC

## 2023-08-04 NOTE — Assessment & Plan Note (Signed)
Intermittent use of Meloxicam. -Continue Meloxicam as needed. -Refill prescription at [facility] on [facility]

## 2023-08-04 NOTE — Progress Notes (Signed)
Established Patient Office Visit  Subjective   Patient ID: JOHNANNA Bowman, female    DOB: [birth year]  Age: 53 y.o. MRN: [id number]  Chief Complaint  Patient presents with   Medical Management of Chronic Issues    HPI   Discussed the use of AI scribe software for clinical note transcription with the patient, who gave verbal consent to proceed.  History of Present Illness   Brianna Bowman is a 53 year old female who presents for smoking cessation and medication refills.  She is seeking assistance with smoking cessation, having difficulty quitting despite previous attempts. She currently smokes one to two packs per day. She has tried nicotine patches and gum but continues to smoke while using them, experiencing a 'buzz'. She previously used Chantix, which caused mood fluctuations, and has also taken Wellbutrin for depression, which did not affect her smoking habits. She is interested in trying the Chantix again.  She is also here for medication refills, specifically lorazepam and meloxicam. She has been using lorazepam intermittently and prefers to take medications on an as-needed basis rather than daily. She does not currently take Buspar regularly, opting to use it only when necessary.           08/04/2023   12:50 PM 04/08/2023   11:27 AM 12/11/2022    3:24 PM  PHQ9 SCORE ONLY  PHQ-9 Total Score 12 3 1       08/04/2023   12:50 PM 04/08/2023   11:28 AM  GAD 7 : Generalized Anxiety Score  Nervous, Anxious, on Edge 1 1  Control/stop worrying 1 1  Worry too much - different things 1 1  Trouble relaxing 1 1  Restless 1 0  Easily annoyed or irritable 1 1  Afraid - awful might happen 0 0  Total GAD 7 Score 6 5  Anxiety Difficulty Somewhat difficult Somewhat difficult         ROS All review of systems negative except what is listed in the HPI     Objective:     BP 117/83   Pulse 92   Ht 5\' 3"  (1.6 m)   Wt 139 lb (63 kg)   SpO2 98%   BMI 24.62 kg/m     Physical Exam Vitals reviewed.  Constitutional:      General: She is not in acute distress.    Appearance: Normal appearance. She is not ill-appearing.  Neurological:     Mental Status: She is alert and oriented to person, place, and time.  Psychiatric:        Mood and Affect: Mood normal.        Behavior: Behavior normal.        Judgment: Judgment normal.      No results found for any visits on 08/04/23.    The 10-year ASCVD risk score (Arnett DK, et al., 2019) is: 7.6%    Assessment & Plan:   Problem List Items Addressed This Visit       Active Problems   Tobacco abuse - Primary (Chronic)   Patient smoking 1-2 packs per day. Previous attempts to quit with nicotine patches, gum were unsuccessful. Patient reports mood instability with Chantix in the past but is willing to retry it no that she is in a stable relationship. -Start Chantix starter pack -Provide patient with printed resources on smoking cessation. -Advise patient to monitor for mood changes and to contact the office if any adverse effects occur. Patient recently completed screening, but results are not  yet available. -Await results of lung cancer screening.       Relevant Medications   Varenicline Tartrate, Starter, (CHANTIX STARTING MONTH PAK) 0.5 MG X 11 & 1 MG X 42 TBPK   Anxiety   Intermittent use of Lorazepam. -Continue Lorazepam as needed. -Schedule follow-up visit within six months due to controlled substance prescription.      Relevant Medications   LORazepam (ATIVAN) 1 MG tablet   Ankle pain, chronic   Intermittent use of Meloxicam. -Continue Meloxicam as needed. -Refill prescription at [facility] on [facility].      Relevant Medications   meloxicam (MOBIC) 7.5 MG tablet    Return in about 6 months (around 02/01/2024) for routine follow-up.    Clayborne Dana, NP

## 2023-08-04 NOTE — Assessment & Plan Note (Signed)
Intermittent use of Lorazepam. -Continue Lorazepam as needed. -Schedule follow-up visit within six months due to controlled substance prescription.

## 2023-08-04 NOTE — Assessment & Plan Note (Addendum)
Patient smoking 1-2 packs per day. Previous attempts to quit with nicotine patches, gum were unsuccessful. Patient reports mood instability with Chantix in the past but is willing to retry it no that she is in a stable relationship. -Start Chantix starter pack -Provide patient with printed resources on smoking cessation. -Advise patient to monitor for mood changes and to contact the office if any adverse effects occur. Patient recently completed screening, but results are not yet available. -Await results of lung cancer screening.

## 2023-08-10 ENCOUNTER — Other Ambulatory Visit: Payer: Self-pay

## 2023-08-10 DIAGNOSIS — Z122 Encounter for screening for malignant neoplasm of respiratory organs: Secondary | ICD-10-CM

## 2023-08-10 DIAGNOSIS — F17210 Nicotine dependence, cigarettes, uncomplicated: Secondary | ICD-10-CM

## 2023-08-10 DIAGNOSIS — Z87891 Personal history of nicotine dependence: Secondary | ICD-10-CM

## 2023-08-26 ENCOUNTER — Ambulatory Visit: Payer: 59 | Admitting: Orthopedic Surgery

## 2023-08-31 ENCOUNTER — Other Ambulatory Visit (INDEPENDENT_AMBULATORY_CARE_PROVIDER_SITE_OTHER): Payer: Self-pay

## 2023-08-31 ENCOUNTER — Ambulatory Visit: Admitting: Orthopedic Surgery

## 2023-08-31 DIAGNOSIS — S82891D Other fracture of right lower leg, subsequent encounter for closed fracture with routine healing: Secondary | ICD-10-CM

## 2023-08-31 NOTE — Progress Notes (Signed)
 Orthopedic Surgery Office Visit   Procedure: Right ankle closed reduction and application of short leg splint Date of Injury: 08/23/2022 (~1 year from injury)   Assessment: Patient is a 53 y.o. who has a right ankle fracture/dislocation that has been treated non-operatively.  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  She is having minimal pain in her right ankle.  She says her left ankle actually feels worse.  She says meloxicam controls the pain well.  She has been weightbearing in regular shoes without assistive devices.  She is pleased with her decision to treat the ankle fracture nonoperatively especially in light of the complication she had on the contralateral side.    Objective:   General: no acute distress, appropriate affect Neurologic: alert, answering questions appropriately, following commands Respiratory: unlabored breathing on room air   MSK (RLE):             No tenderness to palpation over the ankle             Cap refill less than 2 seconds in the toes, palpable DP pulse             Sensation intact to light touch in sural/saphenous/deep peroneal/superficial peroneal/tibial nerve distributions             Plantar flexes and dorsiflexes toes, dorsiflexes and plantar flexes the  ankle             ROM at the ankle from 3-30, can get to neutral passively   XRs of the right ankle taken 08/31/2023 were independently reviewed and interpreted, showing lateral displacement of the talus underneath the tibia.  There is a displaced and angulated medial malleolus fracture that appears chronic.  Persistent fracture line is seen at the distal fibula that is oblique.  No callus formation is seen at either of these fractures.  Talus is well centered underneath the tibia on the lateral view.  No new fracture seen.     Patient name: Brianna Bowman Patient MRN: [id number] Date of visit: 08/31/23

## 2023-10-06 ENCOUNTER — Other Ambulatory Visit: Payer: Self-pay | Admitting: Family Medicine

## 2023-10-06 ENCOUNTER — Other Ambulatory Visit: Payer: Self-pay | Admitting: *Deleted

## 2023-10-06 ENCOUNTER — Encounter: Payer: Self-pay | Admitting: Neurology

## 2023-10-06 DIAGNOSIS — Z720 Tobacco use: Secondary | ICD-10-CM

## 2023-10-06 MED ORDER — NURTEC 75 MG PO TBDP: 1.0000 | ORAL_TABLET | ORAL | 5 refills | Status: AC | PRN

## 2023-10-07 ENCOUNTER — Ambulatory Visit: Payer: 59 | Admitting: Family Medicine

## 2023-10-21 ENCOUNTER — Encounter: Payer: Self-pay | Admitting: Family Medicine

## 2023-10-21 DIAGNOSIS — Z720 Tobacco use: Secondary | ICD-10-CM

## 2023-10-21 MED ORDER — VARENICLINE TARTRATE 1 MG PO TABS: 1.0000 mg | ORAL_TABLET | Freq: Two times a day (BID) | ORAL | 3 refills | Status: AC

## 2023-10-21 NOTE — Addendum Note (Signed)
 Addended by: Everlina Hock on: 10/21/2023 02:37 PM   Modules accepted: Orders

## 2023-10-28 ENCOUNTER — Encounter: Payer: Self-pay | Admitting: Family Medicine

## 2023-10-28 ENCOUNTER — Ambulatory Visit: Admitting: Family Medicine

## 2023-10-28 VITALS — BP 120/79 | HR 70 | Temp 97.5°F | Ht 63.0 in | Wt 136.0 lb

## 2023-10-28 DIAGNOSIS — N926 Irregular menstruation, unspecified: Secondary | ICD-10-CM

## 2023-10-28 DIAGNOSIS — R109 Unspecified abdominal pain: Secondary | ICD-10-CM

## 2023-10-28 LAB — POC URINALSYSI DIPSTICK (AUTOMATED)
Bilirubin, UA: NEGATIVE
Blood, UA: NEGATIVE
Glucose, UA: NEGATIVE
Ketones, UA: NEGATIVE
Leukocytes, UA: NEGATIVE
Nitrite, UA: POSITIVE
Protein, UA: NEGATIVE
Spec Grav, UA: <=1.005 — AB (ref 1.010–1.025)
Urobilinogen, UA: 0.2 U/dL
pH, UA: 5 (ref 5.0–8.0)

## 2023-10-28 LAB — POCT PREGNANCY, URINE: Preg Test, Ur: NEGATIVE

## 2023-10-28 MED ORDER — SULFAMETHOXAZOLE-TRIMETHOPRIM 800-160 MG PO TABS: 1.0000 | ORAL_TABLET | Freq: Two times a day (BID) | ORAL | 0 refills | Status: AC

## 2023-10-28 NOTE — Progress Notes (Signed)
 Acute Office Visit  Subjective:     Patient ID: Brianna Bowman, female    DOB: 02-06-1971, 53 y.o.   MRN: [id number]  Chief Complaint  Patient presents with   Abdominal Pain     Patient is in today for flank pain.   Discussed the use of AI scribe software for clinical note transcription with the patient, who gave verbal consent to proceed.  History of Present Illness Brianna Bowman is a 53 year old female who presents with abdominal pain.  Abdominal pain began approximately five to six days ago, initially presenting in the front and then radiating to the back. The pain is described as dull but becomes sharper when lying on the right side or back, and she can only lie comfortably on her left side. The pain intensifies with certain movements, such as bending and twisting, and is rated as a five out of ten in severity when standing.  She reports no urinary symptoms such as burning, frequency, urgency, or blood. There is no nausea, vomiting, diarrhea, or fever, although she mentions possible mild constipation. She took a laxative on Friday night, which provided relief, and has had a good bowel movement since then, though today's was small. She also took Gas-X, which did not help.  Her last menstrual period was in late March, and she has not had a period since, with no spotting or bleeding. She is sexually active.  She is currently taking Chantix , which causes nausea, making it difficult to discern if nausea is related to her abdominal pain.       All review of systems negative except what is listed in the HPI      Objective:    BP 120/79   Pulse 70   Temp (!) 97.5 F (36.4 C) (Oral)   Ht 5\' 3"  (1.6 m)   Wt 136 lb (61.7 kg)   SpO2 100%   BMI 24.09 kg/m    Physical Exam Vitals reviewed.  Constitutional:      Appearance: Normal appearance.  Cardiovascular:     Rate and Rhythm: Normal rate and regular rhythm.  Pulmonary:     Effort: Pulmonary effort is normal.      Breath sounds: Normal breath sounds.  Abdominal:     General: There is no distension.     Palpations: There is no mass.     Tenderness: There is abdominal tenderness in the right lower quadrant. There is no right CVA tenderness, left CVA tenderness or rebound.  Musculoskeletal:     Comments: R low back paraspinal muscles with mild tenderness to palpation and reported discomfort with bending and twisting motions  Skin:    General: Skin is warm and dry.  Neurological:     Mental Status: She is alert and oriented to person, place, and time.  Psychiatric:        Mood and Affect: Mood normal.        Behavior: Behavior normal.        Thought Content: Thought content normal.        Judgment: Judgment normal.     Results for orders placed or performed in visit on 10/28/23  POCT Urinalysis Dipstick (Automated)  Result Value Ref Range   Color, UA yellow    Clarity, UA clear    Glucose, UA Negative Negative   Bilirubin, UA negative    Ketones, UA negative    Spec Grav, UA <=1.005 (A) 1.010 - 1.025   Blood, UA negative  pH, UA 5.0 5.0 - 8.0   Protein, UA Negative Negative   Urobilinogen, UA 0.2 0.2 or 1.0 E.U./dL   Nitrite, UA positive    Leukocytes, UA Negative Negative  POCT Pregnancy, Urine  Result Value Ref Range   Preg Test, Ur negative         Assessment & Plan:   Problem List Items Addressed This Visit   None Visit Diagnoses       Flank pain    -  Primary   Relevant Medications   sulfamethoxazole-trimethoprim (BACTRIM DS) 800-160 MG tablet   Other Relevant Orders   POCT Urinalysis Dipstick (Automated) (Completed)   Urine Culture   CBC with Differential/Platelet   Comprehensive metabolic panel with GFR     Missed menses       Relevant Orders   POCT Pregnancy, Urine (Completed)       Assessment & Plan Abdominal pain Abdominal/flank pain for 5-6 days, dull to sharp, radiating to right side and back. Differential includes muscular inflammation or abdominal  organ issues. Urine pregnancy test negative in office today. - UA + nitrites, no blood. Vitals stable. No acute distress.  - Will treat as UTI and send for culture - make adjustments pending culture. - CBC/CMP today - If symptoms persist let me know and we can further investigate - Educated on signs/symptoms to monitor for.       Meds ordered this encounter  Medications   sulfamethoxazole-trimethoprim (BACTRIM DS) 800-160 MG tablet    Sig: Take 1 tablet by mouth 2 (two) times daily for 3 days.    Dispense:  6 tablet    Refill:  0    Supervising Provider:   Randie Bustle A [[id number]]    Return if symptoms worsen or fail to improve.  Everlina Hock, NP

## 2023-10-29 LAB — CBC WITH DIFFERENTIAL/PLATELET
Basophils Absolute: 0 10*3/uL (ref 0.0–0.1)
Basophils Relative: 0.7 % (ref 0.0–3.0)
Eosinophils Absolute: 0.1 10*3/uL (ref 0.0–0.7)
Eosinophils Relative: 2 % (ref 0.0–5.0)
HCT: 41.8 % (ref 36.0–46.0)
Hemoglobin: 13.8 g/dL (ref 12.0–15.0)
Lymphocytes Relative: 36.3 % (ref 12.0–46.0)
Lymphs Abs: 2 10*3/uL (ref 0.7–4.0)
MCHC: 33.1 g/dL (ref 30.0–36.0)
MCV: 96.2 fl (ref 78.0–100.0)
Monocytes Absolute: 0.3 10*3/uL (ref 0.1–1.0)
Monocytes Relative: 5.7 % (ref 3.0–12.0)
Neutro Abs: 3.1 10*3/uL (ref 1.4–7.7)
Neutrophils Relative %: 55.3 % (ref 43.0–77.0)
Platelets: 257 10*3/uL (ref 150.0–400.0)
RBC: 4.34 Mil/uL (ref 3.87–5.11)
RDW: 13.9 % (ref 11.5–15.5)
WBC: 5.6 10*3/uL (ref 4.0–10.5)

## 2023-10-29 LAB — COMPREHENSIVE METABOLIC PANEL WITH GFR
ALT: 18 U/L (ref 0–35)
AST: 16 U/L (ref 0–37)
Albumin: 4.5 g/dL (ref 3.5–5.2)
Alkaline Phosphatase: 55 U/L (ref 39–117)
BUN: 11 mg/dL (ref 6–23)
CO2: 26 meq/L (ref 19–32)
Calcium: 9.7 mg/dL (ref 8.4–10.5)
Chloride: 101 meq/L (ref 96–112)
Creatinine, Ser: 0.83 mg/dL (ref 0.40–1.20)
GFR: 81.3 mL/min (ref >60.00)
Glucose, Bld: 87 mg/dL (ref 70–99)
Potassium: 4 meq/L (ref 3.5–5.1)
Sodium: 136 meq/L (ref 135–145)
Total Bilirubin: 0.4 mg/dL (ref 0.2–1.2)
Total Protein: 7.1 g/dL (ref 6.0–8.3)

## 2023-10-30 ENCOUNTER — Encounter: Payer: Self-pay | Admitting: Family Medicine

## 2023-10-31 LAB — URINE CULTURE
MICRO NUMBER:: 16425208
SPECIMEN QUALITY:: ADEQUATE

## 2023-12-08 ENCOUNTER — Telehealth: Payer: Self-pay | Admitting: Neurology

## 2023-12-08 NOTE — Telephone Encounter (Signed)
 Pt called to cancel appt   Appt cancel

## 2023-12-15 ENCOUNTER — Ambulatory Visit: Payer: 59 | Admitting: Neurology

## 2024-04-25 ENCOUNTER — Encounter: Payer: Self-pay | Admitting: Radiology

## 2024-05-31 ENCOUNTER — Telehealth: Payer: Self-pay

## 2024-05-31 NOTE — Telephone Encounter (Signed)
 Pharmacy Patient Advocate Encounter   Received notification from CoverMyMeds that prior authorization for [employer] is due for renewal.   Insurance verification completed.   The patient is insured through [place].  Action: Patient hasn't been seen in your office in over a year. Plan requires updated chart notes for PA renewal.
